# Patient Record
Sex: Male | Born: 1940 | Race: White | Hispanic: No | Marital: Single | Smoking: Former smoker
Health system: Southern US, Community
[De-identification: ages and names within clinical notes are randomized; demographics above are authoritative.]

## PROBLEM LIST (undated history)

## (undated) DIAGNOSIS — I82409 Acute embolism and thrombosis of unspecified deep veins of unspecified lower extremity: Secondary | ICD-10-CM

## (undated) DIAGNOSIS — I739 Peripheral vascular disease, unspecified: Secondary | ICD-10-CM

## (undated) DIAGNOSIS — Z9289 Personal history of other medical treatment: Secondary | ICD-10-CM

## (undated) DIAGNOSIS — E785 Hyperlipidemia, unspecified: Secondary | ICD-10-CM

## (undated) DIAGNOSIS — I4891 Unspecified atrial fibrillation: Secondary | ICD-10-CM

## (undated) DIAGNOSIS — I714 Abdominal aortic aneurysm, without rupture, unspecified: Secondary | ICD-10-CM

## (undated) DIAGNOSIS — I2510 Atherosclerotic heart disease of native coronary artery without angina pectoris: Secondary | ICD-10-CM

## (undated) DIAGNOSIS — I350 Nonrheumatic aortic (valve) stenosis: Secondary | ICD-10-CM

## (undated) DIAGNOSIS — I4892 Unspecified atrial flutter: Secondary | ICD-10-CM

## (undated) DIAGNOSIS — I10 Essential (primary) hypertension: Secondary | ICD-10-CM

## (undated) DIAGNOSIS — I723 Aneurysm of iliac artery: Secondary | ICD-10-CM

## (undated) DIAGNOSIS — Z951 Presence of aortocoronary bypass graft: Secondary | ICD-10-CM

## (undated) HISTORY — DX: Abdominal aortic aneurysm, without rupture, unspecified: I71.40

## (undated) HISTORY — DX: Presence of aortocoronary bypass graft: Z95.1

## (undated) HISTORY — DX: Peripheral vascular disease, unspecified: I73.9

## (undated) HISTORY — DX: Abdominal aortic aneurysm, without rupture: I71.4

## (undated) HISTORY — PX: CORONARY ARTERY BYPASS GRAFT: SHX141

## (undated) HISTORY — DX: Unspecified atrial flutter: I48.92

## (undated) HISTORY — DX: Personal history of other medical treatment: Z92.89

## (undated) HISTORY — DX: Atherosclerotic heart disease of native coronary artery without angina pectoris: I25.10

## (undated) HISTORY — DX: Acute embolism and thrombosis of unspecified deep veins of unspecified lower extremity: I82.409

## (undated) HISTORY — PX: FEMORAL-TIBIAL BYPASS GRAFT: SHX938

## (undated) HISTORY — DX: Essential (primary) hypertension: I10

## (undated) HISTORY — DX: Nonrheumatic aortic (valve) stenosis: I35.0

## (undated) HISTORY — DX: Unspecified atrial fibrillation: I48.91

## (undated) HISTORY — DX: Hyperlipidemia, unspecified: E78.5

## (undated) HISTORY — DX: Aneurysm of iliac artery: I72.3

---

## 2001-07-08 HISTORY — PX: BACK SURGERY: SHX140

## 2002-08-13 HISTORY — PX: CHOLECYSTECTOMY: SHX55

## 2003-11-16 ENCOUNTER — Inpatient Hospital Stay (HOSPITAL_COMMUNITY): Admission: RE | Admit: 2003-11-16 | Discharge: 2003-11-24 | Payer: Self-pay | Admitting: Cardiothoracic Surgery

## 2003-12-15 ENCOUNTER — Encounter: Admission: RE | Admit: 2003-12-15 | Discharge: 2003-12-15 | Payer: Self-pay | Admitting: Cardiothoracic Surgery

## 2007-05-22 ENCOUNTER — Encounter: Payer: Self-pay | Admitting: Internal Medicine

## 2007-06-03 HISTORY — PX: HERNIA REPAIR: SHX51

## 2008-01-28 HISTORY — PX: VASECTOMY: SHX75

## 2008-08-09 ENCOUNTER — Encounter: Admission: RE | Admit: 2008-08-09 | Discharge: 2008-08-09 | Payer: Self-pay | Admitting: Cardiovascular Disease

## 2008-08-16 ENCOUNTER — Ambulatory Visit (HOSPITAL_COMMUNITY): Admission: RE | Admit: 2008-08-16 | Discharge: 2008-08-16 | Payer: Self-pay | Admitting: Cardiovascular Disease

## 2008-08-16 HISTORY — PX: CARDIOVERSION: SHX1299

## 2009-01-05 ENCOUNTER — Encounter: Payer: Self-pay | Admitting: Internal Medicine

## 2009-01-11 ENCOUNTER — Ambulatory Visit (HOSPITAL_COMMUNITY): Admission: RE | Admit: 2009-01-11 | Discharge: 2009-01-11 | Payer: Self-pay | Admitting: Cardiovascular Disease

## 2009-01-11 ENCOUNTER — Encounter: Payer: Self-pay | Admitting: Internal Medicine

## 2009-01-24 ENCOUNTER — Encounter: Payer: Self-pay | Admitting: Internal Medicine

## 2009-01-25 ENCOUNTER — Encounter: Payer: Self-pay | Admitting: Internal Medicine

## 2009-03-02 ENCOUNTER — Encounter: Payer: Self-pay | Admitting: Internal Medicine

## 2009-03-23 ENCOUNTER — Encounter: Payer: Self-pay | Admitting: Internal Medicine

## 2009-04-26 ENCOUNTER — Encounter: Payer: Self-pay | Admitting: Internal Medicine

## 2009-09-19 ENCOUNTER — Encounter: Payer: Self-pay | Admitting: Internal Medicine

## 2009-09-29 ENCOUNTER — Encounter: Payer: Self-pay | Admitting: Internal Medicine

## 2009-10-06 ENCOUNTER — Encounter: Payer: Self-pay | Admitting: Internal Medicine

## 2009-11-13 ENCOUNTER — Ambulatory Visit: Payer: Self-pay | Admitting: Internal Medicine

## 2010-01-04 ENCOUNTER — Telehealth: Payer: Self-pay | Admitting: Internal Medicine

## 2010-02-12 ENCOUNTER — Ambulatory Visit: Payer: Self-pay | Admitting: Internal Medicine

## 2010-03-02 ENCOUNTER — Encounter: Payer: Self-pay | Admitting: Internal Medicine

## 2010-03-16 ENCOUNTER — Ambulatory Visit: Payer: Self-pay | Admitting: Internal Medicine

## 2010-03-19 ENCOUNTER — Observation Stay (HOSPITAL_COMMUNITY): Admission: RE | Admit: 2010-03-19 | Discharge: 2010-03-20 | Payer: Self-pay | Admitting: Internal Medicine

## 2010-03-19 ENCOUNTER — Ambulatory Visit: Payer: Self-pay | Admitting: Cardiovascular Disease

## 2010-03-19 HISTORY — PX: CARDIAC ELECTROPHYSIOLOGY STUDY AND ABLATION: SHX1294

## 2010-03-20 ENCOUNTER — Telehealth: Payer: Self-pay | Admitting: Internal Medicine

## 2010-03-29 ENCOUNTER — Telehealth (INDEPENDENT_AMBULATORY_CARE_PROVIDER_SITE_OTHER): Payer: Self-pay | Admitting: *Deleted

## 2010-04-26 ENCOUNTER — Ambulatory Visit: Payer: Self-pay | Admitting: Internal Medicine

## 2010-05-03 ENCOUNTER — Encounter: Payer: Self-pay | Admitting: Internal Medicine

## 2010-08-05 LAB — CONVERTED CEMR LAB
BUN: 16 mg/dL (ref 6–23)
BUN: 17 mg/dL (ref 6–23)
Basophils Absolute: 0 10*3/uL (ref 0.0–0.1)
Basophils Absolute: 0 10*3/uL (ref 0.0–0.1)
Calcium: 10.7 mg/dL — ABNORMAL HIGH (ref 8.4–10.5)
Creatinine, Ser: 1.1 mg/dL (ref 0.4–1.5)
Eosinophils Absolute: 0.3 10*3/uL (ref 0.0–0.7)
GFR calc non Af Amer: 55.84 mL/min (ref >60)
GFR calc non Af Amer: 69.38 mL/min (ref >60)
Glucose, Bld: 112 mg/dL — ABNORMAL HIGH (ref 70–99)
HCT: 42.8 % (ref 39.0–52.0)
Hemoglobin: 14.7 g/dL (ref 13.0–17.0)
Hemoglobin: 17.4 g/dL — ABNORMAL HIGH (ref 13.0–17.0)
INR: 1.7 — ABNORMAL HIGH (ref 0.8–1.0)
Lymphocytes Relative: 22.3 % (ref 12.0–46.0)
Lymphs Abs: 1.7 10*3/uL (ref 0.7–4.0)
MCHC: 34.4 g/dL (ref 30.0–36.0)
MCV: 92.6 fL (ref 78.0–100.0)
MCV: 92.6 fL (ref 78.0–100.0)
Monocytes Absolute: 1.1 10*3/uL — ABNORMAL HIGH (ref 0.1–1.0)
Monocytes Absolute: 1.5 10*3/uL — ABNORMAL HIGH (ref 0.1–1.0)
Monocytes Relative: 12.3 % — ABNORMAL HIGH (ref 3.0–12.0)
Monocytes Relative: 13.3 % — ABNORMAL HIGH (ref 3.0–12.0)
Neutro Abs: 5.5 10*3/uL (ref 1.4–7.7)
Prothrombin Time: 24.1 s — ABNORMAL HIGH (ref 9.7–11.8)
RBC: 4.62 M/uL (ref 4.22–5.81)
RDW: 15 % — ABNORMAL HIGH (ref 11.5–14.6)
RDW: 15.3 % — ABNORMAL HIGH (ref 11.5–14.6)
Sodium: 142 meq/L (ref 135–145)
WBC: 12.1 10*3/uL — ABNORMAL HIGH (ref 4.5–10.5)
aPTT: 38.3 s — ABNORMAL HIGH (ref 21.7–28.8)
aPTT: 43.1 s — ABNORMAL HIGH (ref 21.7–28.8)

## 2010-08-09 NOTE — Progress Notes (Signed)
Summary: Pradaxa samples  Phone Note Other Incoming   Caller: Haematologist (hospital) Summary of Call: Per [facility], the pt is being d/c'ed from the hospital today. He will be on Pradaxa- request for samples to be placed at the front desk.  Pradaxa 130m  Lot-- [id number] Exp-10/12 #24  Placed at front desk. Initial call taken by: HAlvis Lemmings RN, BSN,  March 20, 2010 8:20 AM

## 2010-08-09 NOTE — Assessment & Plan Note (Signed)
Summary: [email or web address]   Visit Type:  Follow-up Referring Serah Nicoletti:  Dr Georgina Peer Primary Zaia Carre:  Dr Stevie Kern   History of Present Illness: The patient presents today for routine electrophysiology followup. He reports doing very well since his atrial flutter ablation.  He denies procedure related complications.  He is unaware of any further arrhythmias.  The patient denies symptoms of palpitations, chest pain, shortness of breath, orthopnea, PND, lower extremity edema, dizziness, presyncope, syncope, or neurologic sequela. The patient is tolerating medications without difficulties and is otherwise without complaint today.   Current Medications (verified): 1)  Diovan Hct 80-12.5 Mg Tabs (Valsartan-Hydrochlorothiazide) .... Take One Tablet By Mouth Once Daily. 2)  Metoprolol Succinate 50 Mg Xr24h-Tab (Metoprolol Succinate) .... Take 1/4 Tablet By Mouth Daily 3)  Niaspan 500 Mg Cr-Tabs (Niacin (Antihyperlipidemic)) .... Qd 4)  Aspirin 81 Mg Tbec (Aspirin) .... Take One Tablet By Mouth Daily 5)  Fish Oil   Oil (Fish Oil) .... Once Daily 6)  Simvastatin 40 Mg Tabs (Simvastatin) .... Take One Tablet By Mouth Daily At Bedtime 7)  Folic Acid 1 Mg Tabs (Folic Acid) .... Once Daily 8)  Vitamin D 2000 Unit Caps (Cholecalciferol) .... Once Daily 9)  Calcium Carbonate   Powd (Calcium Carbonate) .... 6100m Two Times A Day 10)  Pradaxa 150 Mg Caps (Dabigatran Etexilate Mesylate) .... Two Times A Day  Allergies: 1)  ! * Duragesic Patch  Past History:  Past Medical History: ATRIAL FLUTTER (ICD-427.32) Moderate LA enlargement CAD s/p CABG and repeat CABG Ischemic CM (EF 40%) NYHA Class II CHF PEPTIC ULCER DISEASE (ICD-533.90) HYPERLIPIDEMIA (ICD-272.4) PVD (ICD-443.9) 3x3cm Aortic aneurysm denies DM, CVA or HTN  Past Surgical History: Reviewed history from 11/13/2009 and no changes required. S/p CABG 1994, redo CABG 2005 Status post cholecystectomy. Status post lumbar laminectomy. S/p  hernia operation s/p R fem-pop bypass  Social History: Reviewed history from 11/13/2009 and no changes required. [place]  Retired from [employer]  Tob- quit 1988.  ETOH- quit 198 denies drugs  Review of Systems       All systems are reviewed and negative except as listed in the HPI.   Vital Signs:  Patient profile:   70year old male Height:      73 inches Weight:      224 pounds BMI:     29.66 Pulse rate:   55 / minute BP sitting:   92 / 60  (left arm)  Vitals Entered By: JMargaretmary BayleyCMA (April 26, 2010 11:05 AM)  Physical Exam  General:  Well developed, well nourished, in no acute distress. Head:  normocephalic and atraumatic Eyes:  PERRLA/EOM intact; conjunctiva and lids normal. Mouth:  Teeth, gums and palate normal. Oral mucosa normal. Neck:  supple, no bruits Lungs:  Clear bilaterally to auscultation and percussion. Heart:  RRR, no m/r/g Abdomen:  Bowel sounds positive; abdomen soft and non-tender without masses, organomegaly, or hernias noted. No hepatosplenomegaly. Msk:  Back normal, normal gait. Muscle strength and tone normal. Pulses:  pulses normal in all 4 extremities Extremities:  No clubbing or cyanosis. Neurologic:  Alert and oriented x 3.   EKG  Procedure date:  04/26/2010  Findings:      sinus bradycardia 55 bpm, PR 180, incomplete RBBB  Impression & Recommendations:  Problem # 1:  ATRIAL FLUTTER (ICD-427.32) The patient is doing well s/p atrial flutter ablation.  He denies procedure related complications and is maintaining sinus rhythm. His carries a h/o afib  by medical record, though upon my review of his prior EKGs, I only see atrial flutter.  I have spoken with Dr Claiborne Billings this AM who also only recalls atrial flutter. The patient will continue pradaxa but will follow-up with Dr Claiborne Billings early next week.  Dr Claiborne Billings will review his records.  If upon that review the patient has had only atrial flutter, then I would  recommend that we stop pradaxa.  However, if he has previously had or in the future develops afib, then he will require pradaxa longterm.  Problem # 2:  HYPERLIPIDEMIA (ICD-272.4) stable His updated medication list for this problem includes:    Niaspan 500 Mg Cr-tabs (Niacin (antihyperlipidemic)) ..... Qd    Simvastatin 40 Mg Tabs (Simvastatin) .Marland Kitchen... Take one tablet by mouth daily at bedtime  Problem # 3:  CAD (ICD-414.00) no symptoms of ischemia or CHF  his EF has been 40% previously.  IF he develops decline in EF in the future, he may be a candidate for ICD implantation at that time.   I have returned the entirety of the patient's care back to Dr Claiborne Billings.  He will contact me if I can be of further assistance.  Patient Instructions: 1)  follow-up with Dr Claiborne Billings 2)  return as needed

## 2010-08-09 NOTE — Progress Notes (Signed)
Summary: CALLING TO SCHEDULE PROCEDURE  Phone Note Call from Patient Call back at Home Phone [phone]   Caller: Patient Summary of Call: PT [place] Initial call taken by: Delsa Sale,  January 04, 2010 11:03 AM  Follow-up for Phone Call        set to see Dr Rayann Heman on 02/12/10 at 8:45.  pt aware Janan Halter, RN, BSN  January 04, 2010 4:28 PM

## 2010-08-09 NOTE — Assessment & Plan Note (Signed)
Summary: Troy Miller   Visit Type:  Follow-up Referring Provider:  Dr Georgina Peer Primary Provider:  Dr Stevie Kern   History of Present Illness: Mr Troy Miller is a pleasant 70 yo WM with a h/o atrial fibrillation and atrial flutter who presents today for EP follow-up.  He has decided to consider catheter ablation for atrial flutter.   He reports initially developing atrial flutter in early 2010.  He was found to have atrial flutter and underwent [facility] 2/10.  He did well for several months before developing recurrent atrial flutter.  He underwent repeat cardioversion for atrial flutter 01/2009 after being started on propafenone.  He reports improved exercise tolerance in sinus rhythm.  He reports symptoms of fatigue and decreased exercise tolerance during his arrhythmia.  He also reports palpitations and "irregular" heart beat.  He has returned to atrial flutter.  He reports dypsnea with moderate activity, worse with atrial flutter. The patient denies symptoms of chest pain, orthopnea, PND, lower extremity edema, dizziness, presyncope, syncope, or neurologic sequela. The patient is tolerating medications without difficulties and is otherwise without complaint today.   Current Medications (verified): 1)  Diovan Hct 80-12.5 Mg Tabs (Valsartan-Hydrochlorothiazide) .... Take One Tablet By Mouth Once Daily. 2)  Metoprolol Succinate 50 Mg Xr24h-Tab (Metoprolol Succinate) .... Take One-Half Tablet By Mouth Daily 3)  Niaspan 500 Mg Cr-Tabs (Niacin (Antihyperlipidemic)) .... Qd 4)  Aspirin 81 Mg Tbec (Aspirin) .... Take One Tablet By Mouth Daily 5)  Fish Oil   Oil (Fish Oil) .... Once Daily 6)  Simvastatin 40 Mg Tabs (Simvastatin) .... Take One Tablet By Mouth Daily At Bedtime 7)  Folic Acid 1 Mg Tabs (Folic Acid) .... Once Daily 8)  Vitamin D 2000 Unit Caps (Cholecalciferol) .... Once Daily 9)  Calcium Carbonate   Powd (Calcium Carbonate) .... 681m Two Times A Day 10)  Warfarin Sodium 5 Mg Tabs (Warfarin Sodium)  .... Use As Directed 11)  Propafenone Hcl 150 Mg Tabs (Propafenone Hcl) .... Q 8 Hours  Allergies: 1)  ! * Duragesic Patch  Past History:  Past Medical History: Reviewed history from 11/13/2009 and no changes required. ATRIAL FLUTTER (ICD-427.32) ATRIAL FIBRILLATION (ICD-427.31) Moderate LA enlargement CAD s/p CABG and repeat CABG Ischemic CM (EF 40%) NYHA Class II CHF PEPTIC ULCER DISEASE (ICD-533.90) HYPERLIPIDEMIA (ICD-272.4) PVD (ICD-443.9) 3x3cm Aortic aneurysm denies DM, CVA or HTN  Past Surgical History: Reviewed history from 11/13/2009 and no changes required. S/p CABG 1994, redo CABG 2005 Status post cholecystectomy. Status post lumbar laminectomy. S/p hernia operation s/p R fem-pop bypass  Family History: Reviewed history from 11/13/2009 and no changes required. CAD  Social History: Reviewed history from 11/13/2009 and no changes required. [place]  Retired from [employer]  Tob- quit 1988.  ETOH- quit 198 denies drugs  Review of Systems       All systems are reviewed and negative except as listed in the HPI.   Vital Signs:  Patient profile:   70year old male Height:      73 inches Weight:      225 pounds Pulse rate:   76 / minute BP sitting:   122 / 78  (left arm)  Vitals Entered By: JMargaretmary BayleyCMA (February 12, 2010 9:03 AM)  Physical Exam  General:  Well developed, well nourished, in no acute distress. Head:  normocephalic and atraumatic Eyes:  PERRLA/EOM intact; conjunctiva and lids normal. Mouth:  Teeth, gums and palate normal. Oral mucosa normal. Neck:  supple,  no bruits Lungs:  Clear bilaterally to auscultation and percussion. Heart:  iRRR, no m/r/g Abdomen:  Bowel sounds positive; abdomen soft and non-tender without masses, organomegaly, or hernias noted. No hepatosplenomegaly. Msk:  Back normal, normal gait. Muscle strength and tone normal. Pulses:  pulses normal in all 4 extremities Extremities:   No clubbing or cyanosis.  1+ RLE edema (chronic) Neurologic:  Alert and oriented x 3. Skin:  Intact without lesions or rashes. Psych:  Normal affect.   EKG  Procedure date:  02/12/2010  Findings:      typical appearing atrial flutter,  V rate 75 bpm,incomplete RBBB  Impression & Recommendations:  Problem # 1:  ATRIAL FLUTTER (ICD-427.32)  Mr Troy Miller is a pleasant 70 yo WM with h/o CAD and atrial flutter who presents today for EP follow-up.  Though he carries a diagnosis of afib, I have only atrial flutter documented.  His atrial flutter appears typical by EKG today. Therapeutic strategies for afib and atrial flutter including medicine and ablation were discussed in detail with the patient today. Given that his primary arrhythmia is atrial flutter, I think that we should proceed with atrial flutter ablation and then monitor for atrial fibrillation thereafter.  Risk, benefits, and alternatives to EP study and radiofrequency ablation for atrial flutter were also discussed in detail today. These risks include but are not limited to stroke, bleeding, vascular damage, tamponade, perforation, damage to the heart and other structures, pacemaker, and death. The patient understands these risk and wishes to proceed.    I have also discussed that I would not typically treat him with a Ic drug given risks for death in the [place] trial. I would stop propafenone after the ablation procedure. If he has further atrial fibrillation, then we should consider tikosyn or sotalol for rhythm control.  Orders: TLB-BMP (Basic Metabolic Panel-BMET) (58850-YDXAJOI) TLB-CBC Platelet - w/Differential (85025-CBCD) TLB-PT (Protime) (85610-PTP) TLB-PTT (85730-PTTL)  Problem # 2:  ATRIAL FIBRILLATION (ICD-427.31) as above  Patient Instructions: 1)  Your physician has recommended that you have an ablation.  Catheter ablation is a medical procedure used to treat some cardiac arrhythmias (irregular heartbeats). During  catheter ablation, a long, thin, flexible tube is put into a blood vessel in your groin (upper thigh), or neck. This tube is called an ablation catheter. It is then guided to your heart through the blood vessel. Radiofrequency waves destroy small areas of heart tissue where abnormal heartbeats may cause an arrhythmia to start.  Please see the instruction sheet given to you today.

## 2010-08-09 NOTE — Letter (Signed)
Summary: Office Note   Office Note   Imported By: Sallee Provencal 11/27/2009 10:52:20  _____________________________________________________________________  External Attachment:    Type:   Image     Comment:   External Document

## 2010-08-09 NOTE — Progress Notes (Signed)
Summary: Patients Medical History/Med List  Patients Medical History/Med List   Imported By: Sallee Provencal 11/27/2009 11:03:55  _____________________________________________________________________  External Attachment:    Type:   Image     Comment:   External Document

## 2010-08-09 NOTE — Letter (Signed)
Summary: [facility] By: Sallee Provencal 11/10/2009 13:01:41  _____________________________________________________________________  External Attachment:    Type:   Image     Comment:   External Document

## 2010-08-09 NOTE — Letter (Signed)
Summary: [facility] Office Note  [facility] Office Note   Imported By: Sallee Provencal 11/27/2009 10:46:29  _____________________________________________________________________  External Attachment:    Type:   Image     Comment:   External Document

## 2010-08-09 NOTE — Letter (Signed)
Summary: ELectrophysiology/Ablation Procedure Instructions  [facility]  [address] [place]   [place], [place] [address]   Phone: [phone]  Fax: [phone]     Electrophysiology/Ablation Procedure Instructions    You are scheduled for a(n) flutter ablation on 02/20/10 at 7:30am with [name].  1.  Please come to the [place] at [facility] at 5:30am on the day of your procedure.  2.  Come prepared to stay overnight.   Please bring your insurance cards and a list of your medications.  3.  Come to the [place] office on 02/12/10 for lab work.  .  You do not have to be fasting.  4.  Do not have anything to eat or drink after midnight the night before your procedure.  5.  Do NOT take these medications for the morning of your procedure unless otherwise instructed:  Metoprlol.  All of your remaining medications may be taken with a small amount of water.  6.  Educational material received:    _____ Ablation   * Occasionally, EP studies and ablations can become lengthy.  Please make your family aware of this before your procedure starts.  Average time ranges from 2-8 hours for EP studies/ablations.  Your physician will locate your family after the procedure with the results.  * If you have any questions after you get home, please call the office at [phone].   Leonia Reader  Appended Document: ELectrophysiology/Ablation Procedure Instructions This was reschduled due to INR's new date is 03/19/10 7:30am Check in at Short Stay at 5:30am Nothing to eat or drink after midnight Labs 03/13/10

## 2010-08-09 NOTE — Progress Notes (Signed)
Summary: Question about Pradaxa  Phone Note Call from Patient Call back at Home Phone [phone] Call back at [phone]   Caller: Patient Summary of Call: Pt have question about Pradaxa Initial call taken by: Delsa Sale,  March 29, 2010 3:47 PM  Follow-up for Phone Call        spoke with pt and he needs prior author for Pradaxa 174m daily [place] [phone] Please call and get author done. Pt has 2 weeks of pills left.  We can mail him some samples also.   KJanan Halter RN, BSN  March 29, 2010 5:26 PM  Additional Follow-up for Phone Call Additional follow up Details #1::        Called [facility] in [place]. Pharmacy does not have record of pt having script for pradaxa there. I called pt to confirm. Pt had not taken script to pharmacy yet. The patient said that he would take his script to the pharmacy today to be run through.  Awaiting call from Pt or pharmacy with prior auth info.  Additional Follow-up by: JMargaretmary BayleyCMA,  March 30, 2010 10:08 AM    Additional Follow-up for Phone Call Additional follow up Details #2::    pt calling to say pradaxa needs prior authorization -[facility] [place] April 09, 2010 11:50 AM

## 2010-08-09 NOTE — Letter (Signed)
Summary: [facility] Office Note  [facility] Office Note   Imported By: Sallee Provencal 11/27/2009 10:47:13  _____________________________________________________________________  External Attachment:    Type:   Image     Comment:   External Document

## 2010-08-09 NOTE — Letter (Signed)
Summary: [facility]   Imported By: Marilynne Drivers 06/28/2010 15:21:51  _____________________________________________________________________  External Attachment:    Type:   Image     Comment:   External Document

## 2010-08-09 NOTE — Letter (Signed)
Summary: [facility] By: Sallee Provencal 11/10/2009 13:02:03  _____________________________________________________________________  External Attachment:    Type:   Image     Comment:   External Document

## 2010-08-09 NOTE — Letter (Signed)
Summary: [place] [facility] By: Sallee Provencal 11/10/2009 13:01:19  _____________________________________________________________________  External Attachment:    Type:   Image     Comment:   External Document

## 2010-08-09 NOTE — Assessment & Plan Note (Signed)
Summary: nep/eval for ablation   Visit Type:  Initial Consult Referring Provider:  Dr Georgina Peer Primary Provider:  Dr Stevie Kern  CC:  atrial flutter.  History of Present Illness: Mr Mey is a pleasant 70 yo WM with a h/o atrial fibrillation and atrial flutter who presents today for EP consultation.  He reports initially developing atrial flutter in early 2010.  He was found to have atrial flutter and underwent [facility] 2/10.  He did well for several months before developing recurrent atrial flutter.  He underwent repeat cardioversion for atrial flutter 01/2009 after being started on propafenone.  He reports improved exercise tolerance in sinus rhythm.  He reports symptoms of fatigue and decreased exercise tolerance during his arrhythmia.  He also reports palpitations and "irregular" heart beat.  He has returned to atrial flutter.  He reports dypsnea with moderate activity, worse with atrial flutter. The patient denies symptoms of chest pain, orthopnea, PND, lower extremity edema, dizziness, presyncope, syncope, or neurologic sequela. The patient is tolerating medications without difficulties and is otherwise without complaint today.   Current Medications (verified): 1)  Diovan Hct 80-12.5 Mg Tabs (Valsartan-Hydrochlorothiazide) .... Take One Tablet By Mouth Once Daily. 2)  Metoprolol Succinate 50 Mg Xr24h-Tab (Metoprolol Succinate) .... Take One-Half Tablet By Mouth Daily 3)  Niaspan 500 Mg Cr-Tabs (Niacin (Antihyperlipidemic)) 4)  Aspirin 81 Mg Tbec (Aspirin) .... Take One Tablet By Mouth Daily 5)  Fish Oil   Oil (Fish Oil) .... Once Daily 6)  Simvastatin 40 Mg Tabs (Simvastatin) .... Take One Tablet By Mouth Daily At Bedtime 7)  Folic Acid 1 Mg Tabs (Folic Acid) .... Once Daily 8)  Vitamin D 2000 Unit Caps (Cholecalciferol) .... Once Daily 9)  Calcium Carbonate   Powd (Calcium Carbonate) .... 657m Two Times A Day 10)  Warfarin Sodium 5 Mg Tabs (Warfarin Sodium) .... Use As Directed 11)   Propafenone Hcl 150 Mg Tabs (Propafenone Hcl) .... Q 8 Hours  Allergies (verified): 1)  ! * Duragesic Patch  Past History:  Past Medical History: ATRIAL FLUTTER (ICD-427.32) ATRIAL FIBRILLATION (ICD-427.31) Moderate LA enlargement CAD s/p CABG and repeat CABG Ischemic CM (EF 40%) NYHA Class II CHF PEPTIC ULCER DISEASE (ICD-533.90) HYPERLIPIDEMIA (ICD-272.4) PVD (ICD-443.9) 3x3cm Aortic aneurysm denies DM, CVA or HTN  Past Surgical History: S/p CABG 1994, redo CABG 2005 Status post cholecystectomy. Status post lumbar laminectomy. S/p hernia operation s/p R fem-pop bypass  Family History: CAD  [place].  Retired from [employer]  [employer]ob- quit 1988.  ETOH- quit 198 denies drugs  Review of Systems       All systems are reviewed and negative except as listed in the HPI.   Vital Signs:  Patient profile:   70year old male Height:      73 inches Weight:      223 pounds BMI:     29.53 Pulse rate:   72 / minute Pulse rhythm:   irregular BP sitting:   120 / 84  (left arm)  Vitals Entered By: JMargaretmary BayleyCMA (Nov 13, 2009 10:22 AM)  Physical Exam  General:  Well developed, well nourished, in no acute distress. Head:  normocephalic and atraumatic Eyes:  PERRLA/EOM intact; conjunctiva and lids normal. Nose:  no deformity, discharge, inflammation, or lesions Mouth:  Teeth, gums and palate normal. Oral mucosa normal. Neck:  supple, no bruits Lungs:  Clear bilaterally to auscultation and percussion. Heart:  RRR, no m/r/g Abdomen:  Bowel sounds  positive; abdomen soft and non-tender without masses, organomegaly, or hernias noted. No hepatosplenomegaly. Msk:  Back normal, normal gait. Muscle strength and tone normal. Pulses:  pulses normal in all 4 extremities Extremities:  No clubbing or cyanosis.  1+ RLE edema (chronic) Neurologic:  Alert and oriented x 3. Skin:  Intact without lesions or rashes. Cervical Nodes:  no  significant adenopathy Psych:  Normal affect.   EKG  Procedure date:  11/13/2009  Findings:      atrial flutter (possibly clockwise) V rate 72, nonspecific St/T changes  EKG  Procedure date:  09/29/2009  Findings:      typical appearing atrial flutter, V rate 50  Echocardiogram  Procedure date:  10/06/2009  Findings:      LVEF 40%,  moderate LVH,  mild RV dilatation,  mild MR, LA moderately enlarged  Impression & Recommendations:  Problem # 1:  ATRIAL FLUTTER (ICD-427.32) Mr Ebeling is a pleasant 70 yo WM with h/o CAD and atrial flutter who presents today for EP consultation.  Though he carries a diagnosis of afib, I have only atrial flutter documented.  His atrial flutter has previously appeared typical by ekg, though his EKG today is atypical, possibly clockwise flutter. Therapeutic strategies for afib and atrial flutter including medicine and ablation were discussed in detail with the patient today. Given that his primary arrhythmia is atrial flutter, I think that we should proceed with atrial flutter ablation and then monitor for atrial fibrillation thereafter.  Risk, benefits, and alternatives to EP study and radiofrequency ablation for atrial flutter were also discussed in detail today. These risks include but are not limited to stroke, bleeding, vascular damage, tamponade, perforation, damage to the heart and other structures, pacemaker, and death. The patient understands these risk and wishes to further contemplate his options. He should continue coumadin. I have also discussed that I would not typically treat him with a Ic drug given risks for death in the [place] trial. If he goes for carto guided ablation, then I would stop propafenone after the procedure.  If he does not, then we should consider either tikosyn or multaq for rhythm control. I will defer this decision to Dr Claiborne Billings.   Problem # 2:  CAD (ICD-414.00) stable no changes today His updated medication list for  this problem includes:    Metoprolol Succinate 50 Mg Xr24h-tab (Metoprolol succinate) .Marland Kitchen... Take one-half tablet by mouth daily    Aspirin 81 Mg Tbec (Aspirin) .Marland Kitchen... Take one tablet by mouth daily    Warfarin Sodium 5 Mg Tabs (Warfarin sodium) ..... Use as directed  Problem # 3:  PVD (ICD-443.9) stable no changes today  Patient Instructions: 1)  Pt will contact our office if he wishes to proceed with ablation.  He will follow-up with Dr Claiborne Billings.

## 2010-08-09 NOTE — Progress Notes (Signed)
Summary: Med List  Med List   Imported By: Sallee Provencal 11/10/2009 12:46:58  _____________________________________________________________________  External Attachment:    Type:   Image     Comment:   External Document

## 2010-08-09 NOTE — Letter (Signed)
Summary: [place] By: Sallee Provencal 11/10/2009 12:44:35  _____________________________________________________________________  External Attachment:    Type:   Image     Comment:   External Document

## 2010-08-09 NOTE — Letter (Signed)
Summary: [facility] Office Note  [facility] Office Note   Imported By: Sallee Provencal 11/27/2009 10:43:54  _____________________________________________________________________  External Attachment:    Type:   Image     Comment:   External Document

## 2010-09-20 LAB — CBC
HCT: 48 % (ref 39.0–52.0)
Hemoglobin: 16 g/dL (ref 13.0–17.0)
RDW: 14.7 % (ref 11.5–15.5)
WBC: 8.7 10*3/uL (ref 4.0–10.5)

## 2010-09-20 LAB — PROTIME-INR: INR: 1.77 — ABNORMAL HIGH (ref 0.00–1.49)

## 2010-09-20 LAB — APTT: aPTT: 42 seconds — ABNORMAL HIGH (ref 24–37)

## 2010-09-20 LAB — BASIC METABOLIC PANEL
Calcium: 9.8 mg/dL (ref 8.4–10.5)
Creatinine, Ser: 1.14 mg/dL (ref 0.4–1.5)

## 2010-10-14 LAB — PROTIME-INR
INR: 2.6 — ABNORMAL HIGH (ref 0.00–1.49)
Prothrombin Time: 30 seconds — ABNORMAL HIGH (ref 11.6–15.2)

## 2010-10-23 LAB — APTT

## 2010-10-23 LAB — PROTIME-INR
INR: 2.3 — ABNORMAL HIGH (ref 0.00–1.49)
Prothrombin Time: 26.6 seconds — ABNORMAL HIGH (ref 11.6–15.2)

## 2010-11-20 NOTE — Op Note (Signed)
NAME:  ALEXANDRE, FARIES NO.:  [id number]   MEDICAL RECORD NO.:  [id number]          PATIENT TYPE:  OIB   LOCATION:  2899                         FACILITY:  [facility]   PHYSICIAN:  Shelva Majestic, M.D.     DATE OF BIRTH:  Oct 16, 1940   DATE OF PROCEDURE:  08/16/2008  DATE OF DISCHARGE:  08/16/2008                               OPERATIVE REPORT   PATIENT PROFILE:  [name] is a 70 year old gentleman with  known coronary as well peripheral vascular disease.  He is status post  initial CABG surgery in May 1994 with redo surgery in May 2005.  In  December 2009, he was found to be in atrial flutter of questionable  duration.  Coumadin was instituted.  He has been documented to have  therapeutic anticoagulation for over a month.  INR today was 2.9.  On  August 09, 2008, was 2.7.  INR today, August 16, 2008, is 2.3.  Baseline ECG confirms atrial flutter with variable 3-4 block.   Sodium Pentothal 200 mg was administered by Dr. Fulton Reek for  the anesthesia.   Biphasic synchronized DC cardioversion was performed and the patient  received 100 joules of shock with anterior-posterior patches resulting  in restoration of normal sinus rhythm with a rare PAC.  He tolerated the  procedure well.   He will continue his home medications which include Toprol-XL 75 mg  daily, Diovan/hydrochlorothiazide 80/12.5 in addition to his aspirin,  simvastatin, Niaspan, fish oil, folic acid, and Coumadin 5 mg daily.   Subsequent ECG following cardioversion confirmed sinus rhythm with first-  degree AV block with a PR interval of 232 milliseconds.           ______________________________  Shelva Majestic, M.D.     TK/MEDQ  D:  08/16/2008  T:  08/17/2008  Job:  [id number]   cc:   Fritz Pickerel Dr. Vira Agar

## 2010-11-20 NOTE — Cardiovascular Report (Signed)
NAME:  Troy Miller, Troy Miller NO.:  [id number]   MEDICAL RECORD NO.:  [id number]          PATIENT TYPE:  OIB   LOCATION:  2899                         FACILITY:  [facility]   PHYSICIAN:  Quincy Carnes, MD      DATE OF BIRTH:  Nov 22, 1940   DATE OF PROCEDURE:  DATE OF DISCHARGE:  01/11/2009                            CARDIAC CATHETERIZATION   INDICATIONS:  [name] is a 70 year old gentleman with history of  coronary artery disease and peripheral vascular disease status post CABG  in May 1994 and redo CABG in May 2005 who is followed by Dr. Ellouise Newer  for his multiple cardiac issues.  [name] has a history of atrial  flutter and after discussing treatment options, scheduled for  cardioversion.   PROCEDURE:  After informed consent was obtained, defibrillator pads were  placed in the AP in the anterior-posterior position.  Dr. Ola Spurr  from Anesthesia Service managed the patient's issues related to  sedation.  The patient received propofol and subsequently received a 120-  joule biphasic defibrillator shock.   OUTCOME:  Successful restoration of sinus rhythm.   PLAN:  [name] was successfully cardioverted.  His postprocedure  EKG showed restoration of sinus rhythm with a first-degree AV block and  PACs.  She will follow up with Dr. Claiborne Billings for further medical therapy.   The procedure was completed without complications.      Quincy Carnes, MD  Electronically Signed     JE/MEDQ  D:  01/11/2009  T:  01/12/2009  Job:  [id number]

## 2010-11-23 NOTE — Discharge Summary (Signed)
[name], [name]:  [id number]   MEDICAL RECORD NO.:  [id number]                   PATIENT TYPE:  INP   LOCATION:  2024                                 FACILITY:  [facility]   PHYSICIAN:  Lilia Argue. Servando Snare, M.D.            DATE OF BIRTH:  01-28-1941   DATE OF ADMISSION:  11/16/2003  DATE OF DISCHARGE:  11/23/2003                                 DISCHARGE SUMMARY   ADMISSION DIAGNOSIS:  Recurrent severe coronary artery disease status post  coronary artery bypass grafting in 1994.   ADDITIONAL DIAGNOSES/DISCHARGE DIAGNOSES:  1. Known coronary artery disease status post coronary artery bypass grafting     in 1994.  2. Recurrent coronary artery disease status post reoperative coronary artery     bypass grafting completed on Nov 16, 2003.  3. Peripheral vascular disease status post right femoral to popliteal bypass     with greater saphenous vein in 1996.  4. Hyperlipidemia.  5. Remote history of smoking having quit in 1988.  6. Status post cholecystectomy.  7. Status post lumbar laminectomy.  8. History of peptic ulcer disease in the 1980s.  9. History of postoperative atrial fibrillation from his prior coronary     artery bypass grafting on long term Diltiazem therapy.  10.      Recurrent paroxysmal atrial fibrillation status post most recent     bypass grafting, discharged on Amiodarone and Diltiazem as well as     Coumadin therapy.  11.      Postoperative cellulitis at peripheral IV site on both the right     and left upper extremity, improving at the time of discharge.  12.      Hypertension.   HOSPITAL PROCEDURES/MANAGEMENT:  1. Coronary artery bypass grafting x3 (reoperative and off pulmonary bypass     procedure) completed on Nov 16, 2003 by [name] of [facility].  2. Anticoagulation therapy for paroxysmal atrial fibrillation.  3. IV antibiotic therapy for arm cellulitis secondary to peripheral IV as     well as suspected postoperative  pneumonia.   CONSULTATIONS:  1. Cardiac rehab.  2. Case management.   HISTORY OF PRESENT ILLNESS:  Troy Miller is a pleasant 70 year old male  with known coronary artery disease and peripheral vascular disease who  originally presented to [facility] at age 60 in May 1994 with claudication in his  right leg and angina. At that time, the patient underwent coronary artery  bypass graft x6 with a left internal mammary artery to the first obtuse  marginal, vein graft to the LAD, diagonal vein graft to the second obtuse  marginal, vein graft to the posterior descending and posterior lateral  coronary arteries by Dr. Redmond Pulling. The patient has done quite well from a  cardiac standpoint until recently when he noted increasing shortness of  breath with exertion and was evaluated by Dr. Ellouise Newer. At that time a  Cardiolite stress test was performed  and showed anterior lateral ischemia  and inferior apical scar with mild peri-infarct ischemia at the apex. This  ischemia was new since the patient's prior study. Now this lead to a cardiac  catheterization on October 25, 2003.   The cardiac catheterization films showed the patient's vein graft to the PD  to PL had a proximal 80% stenosis, which would be amenable to angioplasty  and/or stenting. The sequential vein graft to the diagonal LAD had a complex  lesion at the origin of the diagonal graft. The distal LAD was occluded and  filled with collaterals. The left internal mammary artery was patent in  supplying the first obtuse marginal. The vein graft to the second obtuse  marginal was occluded. The distal circumflex and second obtuse marginal were  very small vessels and probably not bypassable.   [name] of [facility] saw the patient in consultation in the [facility] on  Nov 10, 2003. He reviewed the treatment options including angioplasty and  stenting on the right, attempted angioplasty of a vein graft to that  diagonal. They also discussed redo  coronary artery bypass grafting. The  risks benefits, and alternatives to the procedure were discussed with the  patient and his family at that time and they were agreeable to proceed with  surgery. The plan was to rebypass the diagonal and LAD as well as the  proximal portion of the right vein graft with vein from the left thigh and  possibly right internal mammary artery. Given the patient's history of  greater saphenous vein harvesting from the right and left lower leg for  previous bypass as well as right thigh for femoral to popliteal bypass, the  only option remained for left thigh saphenous vein harvesting. The patient  had a markedly positive bilaterally Allen's test, therefore the radial  arteries were not a first choice for conduit. The patient was agreeable to  surgery for a better long term solution and this was planned. Tentatively  arranged for Wednesday, Nov 16, 2003.   HOSPITAL COURSE:  Troy Miller was electively admitted to [facility] on Nov 16, 2003 and underwent a reoperative coronary artery bypass  grafting x3 without the use of cardiopulmonary bypass. The right internal  mammary artery was harvested and grafted to the LAD. The previous left  internal mammary artery graft was preserved to the OM1. The greater  saphenous vein was harvested and grafted to the diagonal and greater  saphenous vein was grafted to the old saphenous vein graft at the distal  portion of the PDA. The saphenous vein was harvested endoscopically from the  left thigh. The patient tolerated this procedure well and was transferred to  the surgical intensive care unit in stable condition. The patient was  successfully extubated later that evening and remained hemodynamically  stable without evidence of bleeding.   The patient was awake, alert, and  neurologically intact on postoperative  day number one. His chest tubes were discontinued in a stepwise fashion without difficulty. His  invasive lines were also discontinued. He was deemed  appropriate and stable for transfer to the step-down unit.   The patient's postoperative course was complicated by paroxysmal atrial  fibrillation. The patient did have a history of atrial fibrillation after  his prior bypass surgery. The patient was asymptomatic at the time of  conversion to a rate controlled atrial fibrillation at the 110-120s. The  patient was started in IV amiodarone therapy and converted into a normal  sinus rhythm  within 48 hours. He was converted to p.o. amiodarone therapy  and maintained a normal sinus rhythm until the time of initiation of  discharge planning. The patient was initiated on Coumadin therapy for a  history of paroxysmal atrial fibrillation and the high risk of conversion  into and out of normal sinus rhythm and atrial fibrillation. The patient was  discharged on appropriate oral medications including:  Diltiazem,  Amiodarone, Toprol to control his rhythm.   The patient had several IV sites infiltrate while maintaining IV amiodarone  therapy. These were treated appropriately with warm compresses and elevation  and the patient was started on appropriate antibiotics for cellulitis.   Postoperative, the patient had a productive cough of thick, greenish sputum  and had an elevated white count and persistent fever. He was initiated on  appropriate antibiotics for possible postoperative pneumonia. At the time of  initiation of discharge planning he had remained afebrile for greater than  48 hours and his white count was trending down. He no longer had a cough  productive of sputum. He was maintaining his saturations in the mid 90s on  room air. The sputum cultures taken came back positive for normal flora.   The patient was deemed appropriate for initiation of discharge planning on  Nov 21, 2003 or postoperative day number six. He was ambulating in the  hallways without difficulty. He had resumed  normal bowel and bladder  function. He had remained afebrile. His blood pressure was stable at 116/66.  His heart was in a regular rate and rhythm reading normal sinus rhythm on  telemetry and a rate of 80s. His SAO2 was 93-95% on room air. His weight was  226 pounds with his preoperative weight being 231 pounds. His lungs were  clear to auscultation. His abdomen was benign. He had no lower extremity  edema. He did have trace upper extremity edema and swelling secondary to  cellulitis. The left upper arm was worse than the right as far as redness,  swelling, and mild edema. The patient's sternum was stable, clean, dry, and  intact. His left lower extremity incision was healing well.   Lab values at the time of discharge planning were PT/INR 15.8 and 1.4 on Nov 22, 2003. BMET from May 16 reads sodium 135, potassium 4.1, chloride 103,  CO2 26, BUN 10, creatinine 1.1, glucose 109. CBC from May 16 reads WBC 11.4,  hemoglobin 11.8, hematocrit 34.8, and platelets of 214.  The patient will be discharged as planned on Nov 23, 2003 pending a.m.  rounds and no change in his clinical status.   DISCHARGE MEDICATIONS:  1. Aspirin 81 mg p.o. daily.  2. Toprol XL 25 mg p.o. daily.  3. Altace 2.5 mg p.o. b.i.d.  4. Lipitor 10 mg p.o. daily.  5. Prozac 20 mg p.o. daily.  6. Niacin 50 mg p.o. q.a.m. and 1000 mg p.o. q.p.m.  7. Isosorbide mononitrate 30 mg p.o. daily.  8. Diltiazem 180 mg p.o. daily.  9. Coumadin 5 mg p.o. daily, then as directed by Dr. Evette Georges office.  10.      Amiodarone 400 mg p.o. b.i.d. for five days, then 400 mg p.o.     daily.  11.      Omnicef 300 mg p.o. b.i.d. for five days.  12.      Ultram 50 mg 1-2 tabs every 4-6 hours p.r.n. for pain.   DISCHARGE INSTRUCTIONS:   ACTIVITY:  The patient should avoid driving. He should avoid heavy  lifting  or strenuous activity. He should continue to walk daily. He should continue  his breathing exercises for one more week.   DIET:   The patient should follow a low-fat, low-salt, heart healthy diet.   WOUND CARE:  The patient may shower. He should wash his incisions daily with  soap and water. He should notify the [facility] office if he has any increased  redness, swelling, or drainage from his incisions.   SPECIAL INSTRUCTIONS:  The patient is to obtain PT/INR blood work on Nov 25, 2003 by Dr. Evette Georges office.   DISCHARGE FOLLOW UP:  1. The patient is to see Dr. Claiborne Billings within two weeks of discharge. He is to     call Dr. Evette Georges office to make this appointment, date, and time. His     office number is [phone].  2. The patient is to see [name] of [facility] on December 15, 2003 at 1 p.m. He     is to arrive at [facility] on December 15, 2003 at 12 p.m.     and undergo a chest x-ray, which he will carry to Dr. Everrett Coombe office     for review.      Carolyn A. Zigmund Gottron.                  Lilia Argue Servando Snare, M.D.    CAF/MEDQ  D:  11/22/2003  T:  11/23/2003  Job:  [id number]   cc:   Shelva Majestic, M.D.  [phone] [address], [address] [place], [place] [address]  Fax: [phone]   Stevie Kern, P.A.-C.

## 2010-11-23 NOTE — Op Note (Signed)
Troy Miller, Troy Miller NO.:  [id number]   MEDICAL RECORD NO.:  [id number]                   PATIENT TYPE:  INP   LOCATION:  2024                                 FACILITY:  [facility]   PHYSICIAN:  Lilia Argue. Servando Snare, M.D.            DATE OF BIRTH:  23-Dec-1940   DATE OF PROCEDURE:  11/16/2003  DATE OF DISCHARGE:                                 OPERATIVE REPORT   PREOPERATIVE DIAGNOSIS:  Coronary occlusive disease, recurrent.   POSTOPERATIVE DIAGNOSIS:  Coronary occlusive disease, recurrent.   PROCEDURE:  Re-do coronary artery bypass grafting off-pump x 3, with the  right internal mammary to the left anterior descending coronary artery,  reverse saphenous vein graft to the diagonal coronary artery and reverse  saphenous vein graft to the distal right coronary graft with endo-vein  harvesting, left thigh.   SURGEON:  Lilia Argue. Servando Snare, M.D.   FIRST ASSISTANT:  Carolyn A. Zigmund Gottron.   BRIEF HISTORY:  The patient is a 70 year old male, who approximately 10  years previously, had undergone coronary artery bypass grafting by Dr.  Redmond Pulling with placement of left internal mammary artery to the obtuse marginal  coronary artery, reverse saphenous vein grafts to a second obtuse marginal,  sequential reverse saphenous vein graft to two small PD and PL branches of  the right coronary artery and sequential reverse saphenous vein graft to the  diagonal and LAD.  The patient initially did well but recently had begun  having increasing unstable anginal symptoms with a positive stress test  showing evidence of anterolateral ischemia.  Because of these symptoms, a  repeat cardiac catheterization was performed by Dr. Shelva Majestic.  The left  internal mammary artery was patent to the obtuse marginal coronary artery.  Vein graft to the second obtuse marginal was occluded.  The vein graft to  the diagonal LAD was diseased such that the anastomosis to the diagonal, the  distal limb of the vein graft went to a totally occluded, isolated segment  of the LAD.  This distal LAD filled by faint collaterals.  The previous  operative note by Dr. Redmond Pulling described the distal LAD as being a good  vessel.  In addition, there was a proximal 80% lesion in the vein graft to  the right coronary artery.  It was not felt that the diagonal LAD disease  was amenable to angioplasty and re-do coronary artery bypass grafting was  recommended.  The patient agreed and signed informed consent.   DESCRIPTION OF PROCEDURE:  With Swan-Ganz and arterial line monitors in  place, the patient underwent general endotracheal anesthesia without  incident.  Skin, chest and legs were prepped with Betadine and draped in the  usual sterile manner.   Previously vein had been totally harvested from the right leg and the left  lower leg.  There was adequate vein in the left thigh.  The patient also had  available right internal mammary.  Using the Guidant endo-vein harvesting  system, the vein was harvested from the left thigh and was of good quality  and caliber.   Median sternotomy was performed with the saggital saw, taking care not to  injure underlying mediastinal structures.  The right internal mammary artery  was dissected as pedicle graft.  The distal artery was divided and had good  free-flow.  The ascending aorta, the right atrium were dissected free of  adhesions.  The dissection was carried laterally identifying the vein graft  to the diagonal LAD.  The location of the left internal mammary artery was  located, however.  At this point, it was consideration to perform to bypass  surgery off-pump and so the left internal mammary artery was not disturbed.   Using the Guidant stabilization devices, adequate exposure of the LAD could  be obtained.  The patient was systemically heparinized with ACT monitored  throughout the case.  The heart was elevated.  With some difficulty, the   diagonal coronary artery was identified and with stabilization device in  place, the vessel was opened and admitted a 1-mm probe proximally and  distally.  Bleeding was controlled with small Serrefine proximally.   Using a segment of reverse saphenous vein graft, the distal anastomosis was  performed with a running 7-0 Prolene.  Retraction was then relaxed and the  vein graft trimmed to the appropriate length.  A partial-occlusion clamp was  placed on the ascending aorta.  Two new puncture aortotomies were performed  avoiding the three previous, old sites.  The two vein grafts were  anastomosed to the ascending aorta.   The partial-occlusion clamp was removed and flow was returned down the  diagonal coronary artery.  Because of both the op note description of the PD  and PL being small branches at the time of dissection, and also appearing to  be very small, it was decided to place the vein graft into the distal part  of the right graft that was not diseased, just prior to the takeoff of the  posterior descending anastomosis.  Using the vessel loop tapes, the old  right vein graft was isolated and opened.  The distal end of the previously  placed vein graft was then anastomosed to the old right coronary graft  distally.   Air was evacuated from the grafts, and the bulldog on the vein graft was  removed, restoring flow through both vein grafts.  Using the stabilization  device, again the heart was elevated.  The LAD was dissected out of its  epicardial fat, was opened, was a bypassable but small vessel, admitting a 1-  mm probe distally.  Using a running 8-0 Prolene, the right internal mammary  as a pedicle graft, was anastomosed to the left anterior descending coronary  artery.  Bulldog was removed from the mammary artery.  The anastomoses were  hemostatic.  The fascia was tacked to the epicardium.   The patient remained hemodynamically stable throughout the procedure.  A right pleural  tube, two mediastinal tubes left in place.  A portion of the  pericardium and mediastinal fat was covered over the ascending aorta.  The  sternum was then closed with #6 stainless steel wire.  Two atrial and two  ventricular pacing wires were applied.  The fascia was closed with  interrupted 0 Vicryl, running 3-0 Vicryl on the subcutaneous tissue and a 4-  0 subcuticular stitch on skin edges.  Dry dressings were applied.   Sponge and needle  count was reported as correct at the completion of the  procedure.  The patient tolerated the procedure without obvious  complications.   Estimated blood loss was less than 100 cc.   Because of the patient's very small size of his distal vessels, I would not  consider him as a candidate for re-do bypass surgery.                                               Lilia Argue Servando Snare, M.D.   Mcneil Sober  D:  11/17/2003  T:  11/17/2003  Job:  [id number]   cc:   Shelva Majestic, M.D.  [phone] [address], [address] [place], [place] [address]  Fax: [phone]

## 2012-06-26 ENCOUNTER — Other Ambulatory Visit (HOSPITAL_COMMUNITY): Payer: Self-pay | Admitting: Cardiovascular Disease

## 2012-06-26 DIAGNOSIS — I4891 Unspecified atrial fibrillation: Secondary | ICD-10-CM

## 2012-06-26 DIAGNOSIS — I2510 Atherosclerotic heart disease of native coronary artery without angina pectoris: Secondary | ICD-10-CM

## 2012-06-26 DIAGNOSIS — R0602 Shortness of breath: Secondary | ICD-10-CM

## 2012-07-10 ENCOUNTER — Ambulatory Visit (HOSPITAL_COMMUNITY)
Admission: RE | Admit: 2012-07-10 | Discharge: 2012-07-10 | Disposition: A | Discharge status: Home / Self Care | Payer: Medicare Other | Source: Ambulatory Visit | Attending: Cardiovascular Disease | Admitting: Cardiovascular Disease

## 2012-07-10 DIAGNOSIS — I10 Essential (primary) hypertension: Secondary | ICD-10-CM | POA: Insufficient documentation

## 2012-07-10 DIAGNOSIS — E785 Hyperlipidemia, unspecified: Secondary | ICD-10-CM | POA: Insufficient documentation

## 2012-07-10 DIAGNOSIS — I2510 Atherosclerotic heart disease of native coronary artery without angina pectoris: Secondary | ICD-10-CM

## 2012-07-10 DIAGNOSIS — R0602 Shortness of breath: Secondary | ICD-10-CM

## 2012-07-10 DIAGNOSIS — I359 Nonrheumatic aortic valve disorder, unspecified: Secondary | ICD-10-CM | POA: Insufficient documentation

## 2012-07-10 DIAGNOSIS — I4891 Unspecified atrial fibrillation: Secondary | ICD-10-CM | POA: Insufficient documentation

## 2012-07-10 MED ORDER — TECHNETIUM TC 99M SESTAMIBI GENERIC - CARDIOLITE
10.3000 | Freq: Once | INTRAVENOUS | Status: AC | PRN
  Administered 2012-07-10: 10 via INTRAVENOUS

## 2012-07-10 MED ORDER — TECHNETIUM TC 99M SESTAMIBI GENERIC - CARDIOLITE
30.1000 | Freq: Once | INTRAVENOUS | Status: AC | PRN
  Administered 2012-07-10: 30.1 via INTRAVENOUS

## 2012-07-10 MED ORDER — REGADENOSON 0.4 MG/5ML IV SOLN
0.4000 mg | Freq: Once | INTRAVENOUS | Status: AC
  Administered 2012-07-10: 0.4 mg via INTRAVENOUS

## 2012-07-10 NOTE — Progress Notes (Signed)
Troy Miller   2D echo completed 07/10/2012.   Jamison Neighbor, RDCS

## 2012-07-10 NOTE — Procedures (Addendum)
[name] [address] [name] [address] [phone]  Cardiology Nuclear Med Study  Troy Miller is a 73 y.o. male     MRN : [id number]     DOB: 05/23/1941  Procedure Date: 07/10/2012  Nuclear Med Background Indication for Stress Test:  Graft Patency History:  CABG 1994 redo 2005, PVD Cardiac Risk Factors: Family History - CAD, Hypertension, Lipids and Overweight  Symptoms:  Dizziness, Palpitations and SOB   Nuclear Pre-Procedure Caffeine/Decaff Intake:  7:00pm NPO After: 5:00am   IV Site: R Antecubital  IV 0.9% NS with Angio Cath:  22g  Chest Size (in):  46 IV Started by: Otho Perl, CNMT  Height: _0  (1.854 m)  Cup Size: n/a  BMI:  Body mass index is 29.55 kg/(m^2). Weight:  224 lb (101.606 kg)   Tech Comments:  n/a    Nuclear Med Study 1 or 2 day study: 1 day  Stress Test Type:  [place] Provider:  Shelva Majestic, MD   Resting Radionuclide: Technetium 75mSestamibi  Resting Radionuclide Dose: 10.3 mCi   Stress Radionuclide:  Technetium 929mestamibi  Stress Radionuclide Dose: 30.1 mCi           Stress Protocol Rest HR: 52 Stress HR: 78  Rest BP: 151/75 Stress BP: 147/73  Exercise Time (min): n/a METS: n/a   Predicted Max HR: 147 bpm % Max HR: 53.06 bpm Rate Pressure Product: 11778   Dose of Adenosine (mg):  n/a Dose of Lexiscan: 0.4 mg  Dose of Atropine (mg): n/a Dose of Dobutamine: n/a mcg/kg/min (at max HR)  Stress Test Technologist: TeLeane ParaCCT Nuclear Technologist: PaImagene RichesCNMT   Rest Procedure:  Myocardial perfusion imaging was performed at rest 45 minutes following the intravenous administration of Technetium 9916mstamibi. Stress Procedure: The patient received V Lexiscan 0.4 mg over 15-seconds.  Technetium 50m50mtamibi injected at 30- seconds. There were no significant changes with Lexiscan.  Quantitative spect images were obtained after a 45 minute  delay.  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  Stress ECG: No significant change from baseline ECG and There are scattered PVCs.  QPS Raw Data Images:  Normal; no motion artifact; normal heart/lung ratio. Stress Images:  Moderate in size, mild in intensity, inferior and apical anterior defect Rest Images:  Comparison with the stress images reveals no significant change inferiorly, but improved apical anterior perfusion. Subtraction (SDS):  Fixed nontransmural inferior defect with mild apical ischemia  Impression Exercise Capacity:  Lexiscan with no exercise. BP Response:  Normal blood pressure response. Clinical Symptoms:  No significant symptoms noted. ECG Impression:  No significant ST segment change suggestive of ischemia. Comparison with Prior Nuclear Study: No significant change from previous study  Overall Impression:  Low risk stress nuclear study demonstrating inferior nontransmural scar with mild apical anterior ischemia, not significantly changed from last study.  LV Wall Motion:  Very mild LV Function, EF 49%; minimal apical relative hypocontractility.   KELLTroy Sine  07/10/2012 12:49 PM

## 2012-08-31 ENCOUNTER — Encounter: Payer: Self-pay | Admitting: *Deleted

## 2012-08-31 DIAGNOSIS — K279 Peptic ulcer, site unspecified, unspecified as acute or chronic, without hemorrhage or perforation: Secondary | ICD-10-CM

## 2012-11-06 ENCOUNTER — Encounter: Payer: Self-pay | Admitting: Cardiovascular Disease

## 2013-01-12 ENCOUNTER — Other Ambulatory Visit: Payer: Self-pay | Admitting: Cardiovascular Disease

## 2013-01-12 NOTE — Telephone Encounter (Signed)
Rx was sent to pharmacy electronically.

## 2013-03-03 ENCOUNTER — Telehealth: Payer: Self-pay | Admitting: Cardiovascular Disease

## 2013-03-03 NOTE — Telephone Encounter (Signed)
Please call-Can not afford his Xarelto-would like for Dr Claiborne Billings to put him on something else.f

## 2013-03-03 NOTE — Telephone Encounter (Signed)
Returned call.  Pt stated he cannot afford Xarelto.  Stated it costs $140.  Wants to switch to something else.  Stated he has been out for 3-4 days.  Pt informed Dr. Claiborne Billings will be notified for further instructions.  Pt verbalized understanding and agreed w/ plan.  Message forwarded to Dr. Claiborne Billings.

## 2013-03-04 NOTE — Telephone Encounter (Signed)
Per Dr. Claiborne Billings, "If he can't take the new agents due to cost, then coumadin."  Returned call and informed pt per instructions by MD/PA.  Pt verbalized understanding.  Stated he doesn't know why it jumped from fifty-something dollars to $140.  Pt informed he will have to contact his insurance company to find out if there was a change in his formulary or if there is another agent that is covered that will be more affordable or he will have to switch to Coumadin.  Pt verbalized understanding and agreed w/ plan.  Pt will call insurance company and then call back.

## 2013-03-10 MED ORDER — RIVAROXABAN 20 MG PO TABS: 20.0000 mg | ORAL_TABLET | Freq: Every day | ORAL | Status: DC

## 2013-03-10 NOTE — Telephone Encounter (Signed)
No return call from pt since RN out of office.  Call to pt.  Stated he was still working on something b/c he doesn't want to go back on coumadin.  Pt informed samples are still an option.  Pt verbalized understanding and agreed w/ plan.  Pt iwll pick up samples tomorrow.  Samples left at front desk for pt pick up.  Lot: [id number] Exp: 05/2015.

## 2013-04-06 ENCOUNTER — Telehealth: Payer: Self-pay | Admitting: Cardiovascular Disease

## 2013-04-06 MED ORDER — RIVAROXABAN 20 MG PO TABS: 20.0000 mg | ORAL_TABLET | Freq: Every day | ORAL | Status: DC

## 2013-04-06 NOTE — Telephone Encounter (Signed)
Returned call.  Pt asking for samples of Xarelto.  Pt informed samples are available and he is also overdue for an appt.  Pt verbalized understanding and appt scheduled for 10.15.14 at 9:30am w/ Dr. Claiborne Billings for f/u.  Pt was supposed to be seen in May 2014.  Pt will pick up samples tomorrow.  Appt notification left w/ samples.

## 2013-04-06 NOTE — Telephone Encounter (Signed)
Patient has medication questions.

## 2013-04-21 ENCOUNTER — Ambulatory Visit: Payer: PRIVATE HEALTH INSURANCE | Admitting: Cardiovascular Disease

## 2013-04-26 ENCOUNTER — Encounter: Payer: Self-pay | Admitting: Cardiovascular Disease

## 2013-04-26 ENCOUNTER — Encounter (HOSPITAL_COMMUNITY): Payer: Self-pay | Admitting: *Deleted

## 2013-04-26 ENCOUNTER — Ambulatory Visit (INDEPENDENT_AMBULATORY_CARE_PROVIDER_SITE_OTHER): Payer: Medicare Other | Admitting: Cardiovascular Disease

## 2013-04-26 VITALS — BP 114/70 | HR 43 | Ht 72.0 in | Wt 212.3 lb

## 2013-04-26 DIAGNOSIS — I714 Abdominal aortic aneurysm, without rupture, unspecified: Secondary | ICD-10-CM

## 2013-04-26 DIAGNOSIS — R5381 Other malaise: Secondary | ICD-10-CM

## 2013-04-26 DIAGNOSIS — Z79899 Other long term (current) drug therapy: Secondary | ICD-10-CM

## 2013-04-26 DIAGNOSIS — I739 Peripheral vascular disease, unspecified: Secondary | ICD-10-CM | POA: Insufficient documentation

## 2013-04-26 DIAGNOSIS — I350 Nonrheumatic aortic (valve) stenosis: Secondary | ICD-10-CM | POA: Insufficient documentation

## 2013-04-26 DIAGNOSIS — I2510 Atherosclerotic heart disease of native coronary artery without angina pectoris: Secondary | ICD-10-CM

## 2013-04-26 DIAGNOSIS — I359 Nonrheumatic aortic valve disorder, unspecified: Secondary | ICD-10-CM

## 2013-04-26 DIAGNOSIS — E782 Mixed hyperlipidemia: Secondary | ICD-10-CM

## 2013-04-26 DIAGNOSIS — I4892 Unspecified atrial flutter: Secondary | ICD-10-CM | POA: Insufficient documentation

## 2013-04-26 NOTE — Progress Notes (Signed)
Patient ID: Troy Miller, male   DOB: 1940-08-09, 72 y.o.   MRN: [id number]     HPI: Troy Miller is a 72 y.o. male who presents for a nine-month cardiology evaluation. I last saw in January 2014.  Mr. Fuston has a history of coronary artery disease and in 1994 underwent initial CBG revascularization surgery. In May 2005 he underwent redo surgery with a RIMA to the LAD, vein graft to distal right coronary artery by Dr. Pia Mau. His previous left internal mammary artery graft was preserved and supplied the OM1 vessel. He has history of atrial flutter status post ablation by [name] as well as a history of PAF. He has documented peripheral vascular disease with abdominal aortic aneurysm, as well as common iliac artery aneurysms. He status post right from 2 bypass graft surgery. Additional problems include hypertension as well as mixed hyperlipidemia. He also was very mild aortic stenosis. His last echo Doppler study was done in January 2014. His last abdominal aneurysm duplex study was done in August 2013.  Over the past 9 months, he has done well without chest pain. He does note some mild lightheadedness intermittently. He denies anginal symptoms. He denies dyspnea. He denies claudication. He is on combination therapy for mixed hyperlipidemia. He does note intermittent leg swelling typically in the afternoon  Past Medical History  Diagnosis Date  . CAD (coronary artery disease)   . 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 Atrial fibrillation     03/19/10 ablation-[name]  . Atrial flutter   . PVD (peripheral vascular disease)     remote right fem-tib bypass graft sugery  . AAA (abdominal aortic aneurysm)   . Iliac artery aneurysm   . Hypertension   . Hyperlipemia   . Aortic stenosis, mild     07/10/12 EF 50-55% on echo-mitral annular ca+ also    Past Surgical History  Procedure Laterality Date  . Coronary artery bypass graft  1994 & 2005  .  Femoral-tibial bypass graft      Remote  . Back surgery  2003  . Cholecystectomy  08/13/02  . Hernia repair  06/03/07  . Vasectomy  01/28/08  . Cardioversion  08/16/08  . Cardiac electrophysiology study and ablation  03/19/10    Allergies  Allergen Reactions  . Fentanyl And Related Other (See Comments)    Hallucinations    Current Outpatient Prescriptions  Medication Sig Dispense Refill  . aspirin 81 MG tablet Take 81 mg by mouth daily.      . calcium carbonate (OS-CAL) 600 MG TABS Take 600 mg by mouth 2 (two) times daily with a meal.      . cholecalciferol (VITAMIN D) 1000 UNITS tablet Take 1,000 Units by mouth 2 (two) times daily.      . folic acid (FOLVITE) 1 MG tablet Take 1 mg by mouth daily.      . metoprolol tartrate (LOPRESSOR) 25 MG tablet Take 12.5 mg by mouth 2 (two) times daily.       . niacin (NIASPAN) 500 MG CR tablet Take 500 mg by mouth once. Takes 4 tabs at bedtime      . Rivaroxaban (XARELTO) 20 MG TABS tablet Take 1 tablet (20 mg total) by mouth daily.  30 tablet  0  . simvastatin (ZOCOR) 20 MG tablet Take 20 mg by mouth every evening.      . valsartan-hydrochlorothiazide (DIOVAN-HCT) 80-12.5 MG per tablet TAKE 1 TABLET BY MOUTH EVERY DAY  30 tablet  6  . fish oil-omega-3 fatty acids 1000 MG capsule Takes 6 caps daily       No current facility-administered medications for this visit.    History   Social History  . Marital Status: Single    Spouse Name: N/A    Number of Children: N/A  . Years of Education: N/A   Occupational History  . Not on file.   Social History Main Topics  . Smoking status: Former Smoker    Types: Cigarettes    Quit date: 04/27/1987  . Smokeless tobacco: Not on file  . Alcohol Use: No  . Drug Use: No  . Sexual Activity: Not on file   Other Topics Concern  . Not on file   Social History Narrative  . No narrative on file    Family History  Problem Relation Age of Onset  . Cancer Mother   . Heart failure Father   . Stroke  Sister    Socially he is divorced has 3 children and 2 grandchildren. He is not routine exercise. No tobacco or alcohol use.  ROS is negative for fevers, chills or night sweats. He denies significant weight change. He is unaware of recurrent tachypalpitations. At times there is some very mild lightheadedness. He denies chest pressure. He denies change in abdominal symptoms. He denies nausea vomiting or diarrhea. He denies hematuria or hematochezia. He denies claudication. He does note leg swelling typically in the afternoon. He denies rash. He denies myalgias.   Other comprehensive 12 point system review is negative.  PE BP 114/70  Pulse 43  Ht 6' (1.829 m)  Wt 212 lb 4.8 oz (96.299 kg)  BMI 28.79 kg/m2  General: Alert, oriented, no distress.  Skin: normal turgor, no rashes HEENT: Normocephalic, atraumatic. Pupils round and reactive; sclera anicteric;no lid lag.  Nose without nasal septal hypertrophy Mouth/Parynx benign; Mallinpatti scale 3 Neck: No JVD, no carotid briuts Lungs: clear to ausculatation and percussion; no wheezing or rales Heart: RRR, s1 s2 normal 6-0/7 systolic murmur in the aortic region Abdomen: Mild diastases recti; soft, nontender; no hepatosplenomehaly, BS+; abdominal aorta nontender and not dilated by palpation. Pulses 2+ Extremities: no clubbing cyanosis or edema, Homan's sign negative  Neurologic: grossly nonfocal Psychologic: normal affect and mood.  ECG: Marked sinus bradycardia 43 beats per minute. Left axis deviation. 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  LABS:  BMET    Component Value Date/Time   NA 137 03/19/2010 0612   K 4.0 03/19/2010 0612   CL 107 03/19/2010 0612   CO2 23 03/19/2010 0612   GLUCOSE 118* 03/19/2010 0612   BUN 19 03/19/2010 0612   CREATININE 1.14 03/19/2010 0612   CALCIUM 9.8 03/19/2010 0612   GFRNONAA >60 03/19/2010 0612   GFRAA  Value: >60        The eGFR has been calculated using the MDRD equation.  This calculation has not been validated in all clinical situations. eGFR's persistently <60 mL/min signify possible Chronic Kidney Disease. 03/19/2010 0612     Hepatic Function Panel  No results found for this basename: prot, albumin, ast, alt, alkphos, bilitot, bilidir, ibili     CBC    Component Value Date/Time   WBC 8.7 03/19/2010 0612   RBC 5.18 03/19/2010 0612   HGB 16.0 03/19/2010 0612   HCT 48.0 03/19/2010 0612   PLT 150 03/19/2010 0612   MCV 92.7 03/19/2010 0612   MCH 30.9 03/19/2010 0612   MCHC 33.3 03/19/2010 0612  RDW 14.7 03/19/2010 0612   LYMPHSABS 1.7 03/16/2010 0000   MONOABS 1.1* 03/16/2010 0000   EOSABS 0.3 03/16/2010 0000   BASOSABS 0.0 03/16/2010 0000     BNP No results found for this basename: probnp    Lipid Panel  No results found for this basename: chol, trig, hdl, cholhdl, vldl, ldlcalc     RADIOLOGY: No results found.    ASSESSMENT AND PLAN:  Mr. Burak has established artery disease dating back 20 years when he underwent his initial CABG surgery. He is now 9 years status post redo bypass surgery. He does have peripheral vascular disease. His blood pressure is well-controlled on current therapy. He is bradycardic. He has been on a reduced dose of Lopressor at 12.5 twice a day. I have recommended he change this to just daily 12.5 mg in the morning. I am recommending a 17 month followup evaluation of his abdominal aortic aneurysm and iliac artery aneurysms. We will check laboratory the fasting state consisting of this comparison metabolic panel, TSH, CBC, and NMR lipoprofile. I will see him in 6 month followup evaluation prior to that office visit he will undergo chemotherapy followup echo Doppler study to reevaluate his systolic diastolic function and aortic valve stenosis.    Troy Sine, MD, [facility]  04/26/2013 9:47 AM

## 2013-04-26 NOTE — Patient Instructions (Addendum)
Your physician recommends that you return for lab work  Fasting.  Your physician has requested that you have an echocardiogram. Echocardiography is a painless test that uses sound waves to create images of your heart. It provides your doctor with information about the size and shape of your heart and how well your heart's chambers and valves are working. This procedure takes approximately one hour. There are no restrictions for this procedure. This will e done in April of 2015.  Your physician has requested that you have an abdominal aorta duplex. During this test, an ultrasound is used to evaluate the aorta. Allow 30 minutes for this exam. Do not eat after midnight the day before and avoid carbonated beverages.   Your physician recommends that you schedule a follow-up appointment in: 6 MONTHS.  Your physician has recommended you make the following change in your medication: decrease the metoprolol down to 12.5 mg once daily in the morning.

## 2013-04-27 ENCOUNTER — Encounter: Payer: Self-pay | Admitting: Cardiovascular Disease

## 2013-05-05 ENCOUNTER — Other Ambulatory Visit: Payer: Self-pay | Admitting: *Deleted

## 2013-05-05 ENCOUNTER — Ambulatory Visit (HOSPITAL_COMMUNITY)
Admission: RE | Admit: 2013-05-05 | Discharge: 2013-05-05 | Disposition: A | Discharge status: Home / Self Care | Payer: Medicare Other | Source: Ambulatory Visit | Attending: Internal Medicine | Admitting: Internal Medicine

## 2013-05-05 DIAGNOSIS — I714 Abdominal aortic aneurysm, without rupture, unspecified: Secondary | ICD-10-CM | POA: Insufficient documentation

## 2013-05-05 LAB — COMPREHENSIVE METABOLIC PANEL
AST: 19 U/L (ref 0–37)
Alkaline Phosphatase: 68 U/L (ref 39–117)
BUN: 18 mg/dL (ref 6–23)
Calcium: 10.2 mg/dL (ref 8.4–10.5)
Chloride: 103 mEq/L (ref 96–112)
Creat: 1.17 mg/dL (ref 0.50–1.35)
Total Bilirubin: 0.6 mg/dL (ref 0.3–1.2)

## 2013-05-05 LAB — CBC
MCH: 31.3 pg (ref 26.0–34.0)
MCHC: 35 g/dL (ref 30.0–36.0)
Platelets: 187 10*3/uL (ref 150–400)
RDW: 14.1 % (ref 11.5–15.5)

## 2013-05-05 MED ORDER — RIVAROXABAN 20 MG PO TABS: 20.0000 mg | ORAL_TABLET | Freq: Every day | ORAL | Status: DC

## 2013-05-05 NOTE — Progress Notes (Signed)
Abdominal Aortic Duplex Completed [place]

## 2013-05-05 NOTE — Telephone Encounter (Signed)
Walk-In Message  "Need Xarelto."  Per front desk staff, pt requesting samples of Xarelto.    Samples given to pt and asked pt to please call in request prior to arrival in clinic.  Pt verbalized understanding and agreed w/ plan.

## 2013-05-06 LAB — NMR LIPOPROFILE WITH LIPIDS
HDL Size: 8.9 nm — ABNORMAL LOW (ref >=9.2)
LDL Particle Number: <300 nmol/L (ref <1000)
LDL Size: 19.6 nm — ABNORMAL LOW (ref >20.5)
Large VLDL-P: 7.7 nmol/L — ABNORMAL HIGH (ref <=2.7)
Small LDL Particle Number: 229 nmol/L (ref <=527)
VLDL Size: 47.4 nm — ABNORMAL HIGH (ref <=46.6)

## 2013-05-11 ENCOUNTER — Ambulatory Visit (HOSPITAL_COMMUNITY)
Admission: RE | Admit: 2013-05-11 | Discharge: 2013-05-11 | Disposition: A | Discharge status: Home / Self Care | Payer: Medicare Other | Source: Ambulatory Visit | Attending: Internal Medicine | Admitting: Internal Medicine

## 2013-05-11 DIAGNOSIS — I2510 Atherosclerotic heart disease of native coronary artery without angina pectoris: Secondary | ICD-10-CM | POA: Insufficient documentation

## 2013-05-11 NOTE — Progress Notes (Signed)
2D Echo Performed 05/11/2013    [name], RCS

## 2013-06-07 ENCOUNTER — Telehealth: Payer: Self-pay | Admitting: Cardiovascular Disease

## 2013-06-07 MED ORDER — RIVAROXABAN 20 MG PO TABS: 20.0000 mg | ORAL_TABLET | Freq: Every day | ORAL | Status: DC

## 2013-06-07 NOTE — Telephone Encounter (Signed)
Called patient to inform samples will be left at front desk.

## 2013-06-07 NOTE — Telephone Encounter (Signed)
Wants to get results from test and bloodwork from about 1-2 weeks ago.  Also would like to get samples of Xarelto 20 mg  Please call

## 2013-07-06 ENCOUNTER — Telehealth: Payer: Self-pay | Admitting: Cardiovascular Disease

## 2013-07-06 MED ORDER — RIVAROXABAN 20 MG PO TABS: 20.0000 mg | ORAL_TABLET | Freq: Every day | ORAL | Status: DC

## 2013-07-06 NOTE — Telephone Encounter (Signed)
Wants to get samples of Xarelto 20 mg.  Please call

## 2013-07-06 NOTE — Telephone Encounter (Signed)
Returned call.  Left message samples available and to call back tomorrow before 4pm if questions.

## 2013-08-13 ENCOUNTER — Other Ambulatory Visit: Payer: Self-pay | Admitting: Cardiovascular Disease

## 2013-08-13 NOTE — Telephone Encounter (Signed)
Pt was calling back again because he didn't hear back from anyone yet. I told him that he just called and he needs to give the nurse time to go over the message and he then said he needed to know TODAY and then hung up on me.  TK

## 2013-08-13 NOTE — Telephone Encounter (Signed)
Pt needs his medication filled. He said the pharmacist told him that someone told him that he has not been seen in a year and he could not get it fillied. He needs Diovan, simvastatin, Xarelto. He said that he is completely out and needs to know about this today.  TK

## 2013-08-16 MED ORDER — SIMVASTATIN 20 MG PO TABS: 20.0000 mg | ORAL_TABLET | Freq: Every evening | ORAL | Status: DC

## 2013-08-16 MED ORDER — VALSARTAN-HYDROCHLOROTHIAZIDE 80-12.5 MG PO TABS: 1.0000 | ORAL_TABLET | Freq: Every day | ORAL | Status: DC

## 2013-08-16 MED ORDER — RIVAROXABAN 20 MG PO TABS: 20.0000 mg | ORAL_TABLET | Freq: Every day | ORAL | Status: DC

## 2013-08-16 NOTE — Telephone Encounter (Signed)
Please call-said he was told he could not get his medicine.He does not understand ,because he just saw Dr Claiborne Billings in October. He said he needs his Diovan and Gabriel Rainwater is cpmpletly out of it.

## 2013-08-16 NOTE — Telephone Encounter (Signed)
Rx(s) was sent to pharmacy electronically. Samples left at front desk for patient pick-up.  Patient notified.

## 2013-08-16 NOTE — Telephone Encounter (Signed)
Pt was calling in regards to his refills and wants to know why we are not filling them for him. Also wanted to know if he can get samples of Xarelto. He was very upset that he has been calling and he can not get a call back.  TK

## 2013-11-05 ENCOUNTER — Ambulatory Visit (INDEPENDENT_AMBULATORY_CARE_PROVIDER_SITE_OTHER): Payer: Medicare Other | Admitting: Cardiovascular Disease

## 2013-11-05 ENCOUNTER — Encounter: Payer: Self-pay | Admitting: Cardiovascular Disease

## 2013-11-05 VITALS — BP 120/70 | HR 49 | Ht 72.0 in | Wt 213.0 lb

## 2013-11-05 DIAGNOSIS — I498 Other specified cardiac arrhythmias: Secondary | ICD-10-CM

## 2013-11-05 DIAGNOSIS — E782 Mixed hyperlipidemia: Secondary | ICD-10-CM | POA: Insufficient documentation

## 2013-11-05 DIAGNOSIS — I2510 Atherosclerotic heart disease of native coronary artery without angina pectoris: Secondary | ICD-10-CM

## 2013-11-05 DIAGNOSIS — I10 Essential (primary) hypertension: Secondary | ICD-10-CM | POA: Insufficient documentation

## 2013-11-05 DIAGNOSIS — Z7901 Long term (current) use of anticoagulants: Secondary | ICD-10-CM

## 2013-11-05 DIAGNOSIS — I739 Peripheral vascular disease, unspecified: Secondary | ICD-10-CM

## 2013-11-05 DIAGNOSIS — I4892 Unspecified atrial flutter: Secondary | ICD-10-CM

## 2013-11-05 DIAGNOSIS — R001 Bradycardia, unspecified: Secondary | ICD-10-CM | POA: Insufficient documentation

## 2013-11-05 DIAGNOSIS — I4891 Unspecified atrial fibrillation: Secondary | ICD-10-CM

## 2013-11-05 DIAGNOSIS — I350 Nonrheumatic aortic (valve) stenosis: Secondary | ICD-10-CM

## 2013-11-05 DIAGNOSIS — I359 Nonrheumatic aortic valve disorder, unspecified: Secondary | ICD-10-CM

## 2013-11-05 LAB — CBC
HCT: 46.4 % (ref 39.0–52.0)
Hemoglobin: 16.3 g/dL (ref 13.0–17.0)
MCH: 30.9 pg (ref 26.0–34.0)
MCHC: 35.1 g/dL (ref 30.0–36.0)
MCV: 87.9 fL (ref 78.0–100.0)
PLATELETS: 162 10*3/uL (ref 150–400)
RBC: 5.28 MIL/uL (ref 4.22–5.81)
RDW: 15 % (ref 11.5–15.5)
WBC: 10.4 10*3/uL (ref 4.0–10.5)

## 2013-11-05 LAB — COMPREHENSIVE METABOLIC PANEL
ALT: 12 U/L (ref 0–53)
AST: 19 U/L (ref 0–37)
Albumin: 4.7 g/dL (ref 3.5–5.2)
Alkaline Phosphatase: 66 U/L (ref 39–117)
BUN: 17 mg/dL (ref 6–23)
CO2: 28 mEq/L (ref 19–32)
Calcium: 10.3 mg/dL (ref 8.4–10.5)
Chloride: 102 mEq/L (ref 96–112)
Creat: 1.06 mg/dL (ref 0.50–1.35)
Glucose, Bld: 110 mg/dL — ABNORMAL HIGH (ref 70–99)
Potassium: 5.1 mEq/L (ref 3.5–5.3)
Sodium: 137 mEq/L (ref 135–145)
Total Bilirubin: 0.7 mg/dL (ref 0.2–1.2)
Total Protein: 7.8 g/dL (ref 6.0–8.3)

## 2013-11-05 LAB — TSH: TSH: 1.384 u[IU]/mL (ref 0.350–4.500)

## 2013-11-05 NOTE — Patient Instructions (Signed)
Your physician recommends that you return for lab work fasting.  Your physician recommends that you schedule a follow-up appointment in: 6 months.

## 2013-11-05 NOTE — Progress Notes (Signed)
Patient ID: Troy Miller, male   DOB: 06/30/1941, 73 y.o.   MRN: [id number]     HPI: Troy Miller is a 73 y.o. male who presents for a 6 month cardiology evaluation.   [name] has a history of coronary artery disease and in 1994 underwent initial CBG revascularization surgery. In May 2005 he underwent redo surgery with a RIMA to the LAD, vein graft to distal right coronary artery by Dr. Jobie Quaker. His previous left internal mammary artery graft was preserved and supplied the OM1 vessel. He has history of atrial flutter status post ablation by [name] as well as a history of PAF. He has documented peripheral vascular disease with abdominal aortic aneurysm, as well as common iliac artery aneurysms. He status post right fem-tibbypass graft surgery. Additional problems include hypertension as well as mixed hyperlipidemia. He also was very mild aortic stenosis. His last echo Doppler study was done in January 2014. His last abdominal aneurysm duplex study was done in August 2013.  Over the past 6 months, he has done well without chest pain.  2 bradycardia, his Lopressor dose was ultimately weaned and discontinued.  He denies any recurrent palpitations with discontinuance of Lopressor. He does note some mild lightheadedness intermittently. He denies anginal symptoms. He denies dyspnea. He denies claudication. He is on combination therapy for mixed hyperlipidemia. He does note rare leg swelling.  He underwent a followup echo Doppler study on 05/11/2013.  This showed an ejection fraction in the 45-50% range with mild inferior hypokinesis.  There was grade 1 diastolic dysfunction.  His aortic valve was moderately calcified and there was evidence for mild aortic valve stenosis with a peak gradient of 19 and mean gradient of 10. Aortic valve area was 1.5 1.64 cm.  There was mild aortic root dilatation at 42 mm mild dilatation of the ascending aorta..  There was mitral regurgitation.  Past Medical  History  Diagnosis Date  . CAD (coronary artery disease)   . 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 Atrial fibrillation     03/19/10 ablation-[name]  . Atrial flutter   . PVD (peripheral vascular disease)     remote right fem-tib bypass graft sugery  . AAA (abdominal aortic aneurysm)   . Iliac artery aneurysm   . Hypertension   . Hyperlipemia   . Aortic stenosis, mild     07/10/12 EF 50-55% on echo-mitral annular ca+ also    Past Surgical History  Procedure Laterality Date  . Coronary artery bypass graft  1994 & 2005  . Femoral-tibial bypass graft      Remote  . Back surgery  2003  . Cholecystectomy  08/13/02  . Hernia repair  06/03/07  . Vasectomy  01/28/08  . Cardioversion  08/16/08  . Cardiac electrophysiology study and ablation  03/19/10    Allergies  Allergen Reactions  . Fentanyl And Related Other (See Comments)    Hallucinations    Current Outpatient Prescriptions  Medication Sig Dispense Refill  . aspirin 81 MG tablet Take 81 mg by mouth daily.      . calcium carbonate (OS-CAL) 600 MG TABS Take 600 mg by mouth 2 (two) times daily with a meal.      . cholecalciferol (VITAMIN D) 1000 UNITS tablet Take 1,000 Units by mouth 2 (two) times daily.      . fish oil-omega-3 fatty acids 1000 MG capsule Takes 6 caps daily      . folic acid (FOLVITE)  1 MG tablet Take 1 mg by mouth daily.      . niacin (NIASPAN) 500 MG CR tablet Take 500 mg by mouth once. Takes 4 tabs at bedtime      . Rivaroxaban (XARELTO) 20 MG TABS tablet Take 1 tablet (20 mg total) by mouth daily with supper.  15 tablet  0  . simvastatin (ZOCOR) 20 MG tablet Take 1 tablet (20 mg total) by mouth every evening.  30 tablet  8  . valsartan-hydrochlorothiazide (DIOVAN-HCT) 80-12.5 MG per tablet Take 1 tablet by mouth daily.  30 tablet  8   No current facility-administered medications for this visit.    History   Social History  . Marital Status: Single    Spouse Name: N/A     Number of Children: N/A  . Years of Education: N/A   Occupational History  . Not on file.   Social History Main Topics  . Smoking status: Former Smoker    Types: Cigarettes    Quit date: 04/27/1987  . Smokeless tobacco: Not on file  . Alcohol Use: No  . Drug Use: No  . Sexual Activity: Not on file   Other Topics Concern  . Not on file   Social History Narrative  . No narrative on file    Family History  Problem Relation Age of Onset  . Cancer Mother   . Heart failure Father   . Stroke Sister    Socially he is divorced has 3 children and 2 grandchildren. He is not routine exercise. No tobacco or alcohol use.  ROS General: Negative; No fevers, chills, or night sweats;  HEENT: Negative; No changes in vision or hearing, sinus congestion, difficulty swallowing Pulmonary: Negative; No cough, wheezing, shortness of breath, hemoptysis Cardiovascular: Negative; No chest pain, presyncope, syncope, palpatations No change in claudication GI: Negative; No nausea, vomiting, diarrhea, or abdominal pain GU: Negative; No dysuria, hematuria, or difficulty voiding Musculoskeletal: Negative; no myalgias, joint pain, or weakness Hematologic/Oncology: Negative; no easy bruising, bleeding Endocrine: Negative; no heat/cold intolerance; no diabetes Neuro: Occasional transient toe paresthesias; no changes in balance, headaches Skin: Negative; No rashes or skin lesions Psychiatric: Negative; No behavioral problems, depression Sleep: Negative; No snoring, daytime sleepiness, hypersomnolence, bruxism, restless legs, hypnogognic hallucinations, no cataplexy Other comprehensive 14 point system review is negative.  PE BP 120/70  Pulse 49  Ht 6' (1.829 m)  Wt 213 lb (96.616 kg)  BMI 28.88 kg/m2  General: Alert, oriented, no distress.  Skin: normal turgor, no rashes HEENT: Normocephalic, atraumatic. Pupils round and reactive; sclera anicteric;no lid lag.  Nose without nasal septal  hypertrophy Mouth/Parynx benign; Mallinpatti scale 3 Neck: No JVD, probable transmitted murmur to the right lower carotid region; normal carotid upstroke Lungs: clear to ausculatation and percussion; no wheezing or rales Heart: RRR, s1 s2 normal 1-0/0 systolic murmur in the aortic region Abdomen: Mild diastases recti; soft, nontender; no hepatosplenomehaly, BS+; abdominal aorta nontender and not dilated by palpation. Pulses 2+ Extremities: no clubbing cyanosis or edema, Homan's sign negative  Neurologic: grossly nonfocal Psychologic: normal affect and mood.  ECG (independently read by me): Sinus bradycardia at 49 beats per minute.  QTc interval 426 ms.  PR interval 174 ms.  Mild RV conduction delay  Prior 04/26/2013 ECG: Marked sinus bradycardia 43 beats per minute. Left axis deviation. 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  LABS:  BMET    Component Value Date/Time   NA 138 05/05/2013 0839   K 4.5 05/05/2013 7121  CL 103 05/05/2013 0839   CO2 27 05/05/2013 0839   GLUCOSE 102* 05/05/2013 0839   BUN 18 05/05/2013 0839   CREATININE 1.17 05/05/2013 0839   CREATININE 1.14 03/19/2010 0612   CALCIUM 10.2 05/05/2013 0839   GFRNONAA >60 03/19/2010 0612   GFRAA  Value: >60        The eGFR has been calculated using the MDRD equation. This calculation has not been validated in all clinical situations. eGFR's persistently <60 mL/min signify possible Chronic Kidney Disease. 03/19/2010 0612     Hepatic Function Panel     Component Value Date/Time   PROT 7.9 05/05/2013 0839     CBC    Component Value Date/Time   WBC 11.2* 05/05/2013 0839   RBC 5.08 05/05/2013 0839   HGB 15.9 05/05/2013 0839   HCT 45.4 05/05/2013 0839   PLT 187 05/05/2013 0839   MCV 89.4 05/05/2013 0839   MCH 31.3 05/05/2013 0839   MCHC 35.0 05/05/2013 0839   RDW 14.1 05/05/2013 0839   LYMPHSABS 1.7 03/16/2010 0000   MONOABS 1.1* 03/16/2010 0000   EOSABS 0.3 03/16/2010 0000   BASOSABS  0.0 03/16/2010 0000     BNP No results found for this basename: probnp    Lipid Panel  No results found for this basename: chol,  trig,  hdl,  cholhdl,  vldl,  ldlcalc     RADIOLOGY: No results found.    ASSESSMENT AND PLAN:  Mr. Lammert has established artery disease dating back 21 years when he underwent his initial CABG surgery. He is now 10 years status post redo bypass surgery. He has peripheral vascular disease. His blood pressure is well-controlled on current therapy.  As his last office visit, he has been weaned off of beta blocker therapy.  He is bradycardic, but remains asymptomatic with a pulse of 49.  There is no recurrent atrial fibrillation or atrial flutter.  He continues to be on Xarelto for anticoagulation therapy and denies any bleeding.  He also is on combination therapy for his previous mixed hyperlipidemia.  His echo Doppler study shows an ejection fraction of 45-50%.  His aortic stenosis remains mild and is not significantly changed from his prior echo.  His blood pressure today is well-controlled on current therapy with Diovan HCT 80/12.5.  He is fasting today.  A complete set of blood work will be obtained and I will also do an NMR LipoProfile for particle determination and continued aggressive management of his lipids.  He is not having any change in claudication.  I will contact him regarding his laboratory work and if necessary adjustments will be made to his medical regimen.  I will see him in 6 months for cardiology reevaluation.  Troy Sine, MD, [facility]  11/05/2013 8:03 AM

## 2013-11-30 ENCOUNTER — Encounter: Payer: Self-pay | Admitting: *Deleted

## 2014-04-05 ENCOUNTER — Encounter (HOSPITAL_COMMUNITY): Payer: Self-pay | Admitting: *Deleted

## 2014-04-05 ENCOUNTER — Other Ambulatory Visit (HOSPITAL_COMMUNITY): Payer: Self-pay | Admitting: Cardiovascular Disease

## 2014-04-05 DIAGNOSIS — I714 Abdominal aortic aneurysm, without rupture, unspecified: Secondary | ICD-10-CM

## 2014-04-25 ENCOUNTER — Telehealth: Payer: Self-pay | Admitting: Cardiovascular Disease

## 2014-04-25 NOTE — Telephone Encounter (Signed)
Pt would like some samples of Xarelto 20 mg please.

## 2014-04-26 NOTE — Telephone Encounter (Signed)
Spoke to patient. No samples available, informed patient to call back. Patient states he has not had Xarelto for week.He states he went to pharmacy the medication was $150's.he states he called [place] yesterday for the first time.  RN informed patient he should not be out of medication that long.RN called [facility] in [place]. Spoke to Pharmacist Manuela Schwartz),- free 30 day trial offer information given  BIN [id number], GROUP [id number], MEMBER [phone]  RN called patient,informed him 30 tablets are available to him to pick up. Informed patient to discuss with Dr Claiborne Billings at next appointment 05/04/14 Patient verbalized understanding.

## 2014-04-26 NOTE — Telephone Encounter (Signed)
acknowledged

## 2014-04-29 ENCOUNTER — Ambulatory Visit (HOSPITAL_COMMUNITY)
Admission: RE | Admit: 2014-04-29 | Discharge: 2014-04-29 | Disposition: A | Discharge status: Home / Self Care | Payer: Medicare Other | Source: Ambulatory Visit | Attending: Cardiovascular Disease | Admitting: Cardiovascular Disease

## 2014-04-29 DIAGNOSIS — I714 Abdominal aortic aneurysm, without rupture, unspecified: Secondary | ICD-10-CM

## 2014-04-29 NOTE — Progress Notes (Signed)
Aorta Duplex Completed. Oda Cogan, BS, RDMS, RVT

## 2014-05-04 ENCOUNTER — Ambulatory Visit (INDEPENDENT_AMBULATORY_CARE_PROVIDER_SITE_OTHER): Payer: Medicare Other | Admitting: Cardiovascular Disease

## 2014-05-04 ENCOUNTER — Encounter: Payer: Self-pay | Admitting: Cardiovascular Disease

## 2014-05-04 VITALS — BP 118/70 | HR 58 | Ht 72.0 in | Wt 208.0 lb

## 2014-05-04 DIAGNOSIS — I10 Essential (primary) hypertension: Secondary | ICD-10-CM

## 2014-05-04 DIAGNOSIS — E782 Mixed hyperlipidemia: Secondary | ICD-10-CM

## 2014-05-04 DIAGNOSIS — Z7901 Long term (current) use of anticoagulants: Secondary | ICD-10-CM

## 2014-05-04 DIAGNOSIS — I739 Peripheral vascular disease, unspecified: Secondary | ICD-10-CM

## 2014-05-04 DIAGNOSIS — I209 Angina pectoris, unspecified: Secondary | ICD-10-CM

## 2014-05-04 DIAGNOSIS — I25118 Atherosclerotic heart disease of native coronary artery with other forms of angina pectoris: Secondary | ICD-10-CM

## 2014-05-04 DIAGNOSIS — I350 Nonrheumatic aortic (valve) stenosis: Secondary | ICD-10-CM

## 2014-05-04 DIAGNOSIS — I480 Paroxysmal atrial fibrillation: Secondary | ICD-10-CM

## 2014-05-04 DIAGNOSIS — I2510 Atherosclerotic heart disease of native coronary artery without angina pectoris: Secondary | ICD-10-CM

## 2014-05-04 DIAGNOSIS — Z79899 Other long term (current) drug therapy: Secondary | ICD-10-CM

## 2014-05-04 MED ORDER — RIVAROXABAN 20 MG PO TABS: 20.0000 mg | ORAL_TABLET | Freq: Every day | ORAL | Status: DC

## 2014-05-04 NOTE — Patient Instructions (Signed)
Your physician has requested that you have an echocardiogram. Echocardiography is a painless test that uses sound waves to create images of your heart. It provides your doctor with information about the size and shape of your heart and how well your heart's chambers and valves are working. This procedure takes approximately one hour. There are no restrictions for this procedure. This will be done in April 2016.  Your physician recommends that you return for lab work fasting. Do not eat or drink after midnight the night prior to going to the lab. No appointment is needed.  Your physician wants you to follow-up in: 6 months or sooner if needed with Dr. Claiborne Billings.You will receive a reminder letter in the mail two months in advance. If you don't receive a letter, please call our office to schedule the follow-up appointment.

## 2014-05-04 NOTE — Progress Notes (Signed)
Patient ID: Troy Miller, male   DOB: [birth year], 72 y.o.   MRN: [id number]     HPI: Troy Miller is a 73 y.o. male who presents for a 6 month cardiology evaluation.   Troy Miller has a history of coronary artery disease and in 1994 underwent initial CABG revascularization surgery. In May 2005 he underwent redo surgery with a RIMA to the LAD, vein graft to distal right coronary artery by [name]. His previous LIMA graft was preserved and supplied the OM1 vessel. He has history of atrial flutter status post ablation by [name] as well as a history of PAF. He has documented peripheral vascular disease with abdominal aortic aneurysm, as well as common iliac artery aneurysms. He status post right fem-tib bypass graft surgery. Additional problems include hypertension as well as mixed hyperlipidemia. He also has mild aortic stenosis and documentation of an infrarenal abdominal aortic aneurysm.   Over the past year he has done well without chest pain.  Due to  bradycardia, his Lopressor dose was ultimately weaned and discontinued.  He denies any recurrent palpitations with discontinuance of Lopressor. He does note some mild lightheadedness intermittently. He denies anginal symptoms. He denies dyspnea. He denies claudication. He is on combination therapy for mixed hyperlipidemia. He does note rare leg swelling.  A followup echo Doppler study on 05/11/2013 showed an ejection fraction in the 45-50% range with mild inferior hypokinesis.  There was grade 1 diastolic dysfunction.  His aortic valve was moderately calcified and there was evidence for mild aortic valve stenosis with a peak gradient of 19 and mean gradient of 10. Aortic valve area was 1.5 1.64 cm.  There was mild aortic root dilatation at 42 mm mild dilatation of the ascending aorta..  There was mitral regurgitation.  Troy Miller underwent a follow-up abdominal aortic Doppler study April 29, 2014.  He again was noted to have an  infrarenal fusiform aneurysm with current measurements at 3.63.7 with mild amount of atherosclerosis visualized throughout.  The right and left proximal common iliac arteries also demonstrated aneurysmal dilatation.  The right common iliac artery measured 2.3.  A 2.3 without evidence for stenosis.  Left common iliac artery measured 3.2 x 3.0 with mild amount of thrombus and low flow velocity.  He is unaware of any recurrent atrial fibrillation.  He has been maintained on Xarelto for anticoagulation.  Although he had been scheduled for follow-up NMR profile 6 months ago.  This apparently was never done.  He has continued to be on niacin, Zocor and fish oil therapy.  Past Medical History  Diagnosis Date  . CAD (coronary artery disease)   . 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 Atrial fibrillation     03/19/10 ablation-[name]  . Atrial flutter   . PVD (peripheral vascular disease)     remote right fem-tib bypass graft sugery  . AAA (abdominal aortic aneurysm)   . Iliac artery aneurysm   . Hypertension   . Hyperlipemia   . Aortic stenosis, mild     07/10/12 EF 50-55% on echo-mitral annular ca+ also    Past Surgical History  Procedure Laterality Date  . Coronary artery bypass graft  1994 & 2005  . Femoral-tibial bypass graft      Remote  . Back surgery  2003  . Cholecystectomy  08/13/02  . Hernia repair  06/03/07  . Vasectomy  01/28/08  . Cardioversion  08/16/08  . Cardiac electrophysiology study and ablation  03/19/10    Allergies  Allergen Reactions  . Fentanyl And Related Other (See Comments)    Hallucinations    Current Outpatient Prescriptions  Medication Sig Dispense Refill  . aspirin 81 MG tablet Take 81 mg by mouth daily.      . calcium carbonate (OS-CAL) 600 MG TABS Take 600 mg by mouth 2 (two) times daily with a meal.      . cholecalciferol (VITAMIN D) 1000 UNITS tablet Take 1,000 Units by mouth 2 (two) times daily.      . fish oil-omega-3  fatty acids 1000 MG capsule Takes 6 caps daily      . folic acid (FOLVITE) 1 MG tablet Take 1 mg by mouth daily.      . niacin (NIASPAN) 500 MG CR tablet Take 500 mg by mouth once. Takes 4 tabs at bedtime      . Rivaroxaban (XARELTO) 20 MG TABS tablet Take 1 tablet (20 mg total) by mouth daily with supper.  15 tablet  0  . simvastatin (ZOCOR) 20 MG tablet Take 1 tablet (20 mg total) by mouth every evening.  30 tablet  8  . valsartan-hydrochlorothiazide (DIOVAN-HCT) 80-12.5 MG per tablet Take 1 tablet by mouth daily.  30 tablet  8   No current facility-administered medications for this visit.    History   Social History  . Marital Status: Single    Spouse Name: N/A    Number of Children: N/A  . Years of Education: N/A   Occupational History  . Not on file.   Social History Main Topics  . Smoking status: Former Smoker    Types: Cigarettes    Quit date: 04/27/1987  . Smokeless tobacco: Not on file  . Alcohol Use: No  . Drug Use: No  . Sexual Activity: Not on file   Other Topics Concern  . Not on file   Social History Narrative  . No narrative on file    Family History  Problem Relation Age of Onset  . Cancer Mother   . Heart failure Father   . Stroke Sister    Socially he is divorced has 3 children and 2 grandchildren. He is not routine exercise. No tobacco or alcohol use.  ROS General: Negative; No fevers, chills, or night sweats;  HEENT: Negative; No changes in vision or hearing, sinus congestion, difficulty swallowing Pulmonary: Negative; No cough, wheezing, shortness of breath, hemoptysis Cardiovascular: Negative; No chest pain, presyncope, syncope, palpatations No change in claudication GI: Negative; No nausea, vomiting, diarrhea, or abdominal pain GU: Negative; No dysuria, hematuria, or difficulty voiding Musculoskeletal: Negative; no myalgias, joint pain, or weakness Hematologic/Oncology: Negative; no easy bruising, bleeding Endocrine: Negative; no heat/cold  intolerance; no diabetes Neuro: Occasional transient toe paresthesias; no changes in balance, headaches Skin: Negative; No rashes or skin lesions Psychiatric: Negative; No behavioral problems, depression Sleep: Negative; No snoring, daytime sleepiness, hypersomnolence, bruxism, restless legs, hypnogognic hallucinations, no cataplexy Other comprehensive 14 point system review is negative.  PE BP 118/70  Pulse 58  Ht 6' (1.829 m)  Wt 208 lb (94.348 kg)  BMI 28.20 kg/m2  General: Alert, oriented, no distress.  Skin: normal turgor, no rashes HEENT: Normocephalic, atraumatic. Pupils round and reactive; sclera anicteric;no lid lag.  Nose without nasal septal hypertrophy Mouth/Parynx benign; Mallinpatti scale 3 Neck: No JVD, probable transmitted murmur to the right lower carotid region; normal carotid upstroke Lungs: clear to ausculatation and percussion; no wheezing or rales Heart: RRR, s1 s2 normal 5-4/6 systolic murmur  in the aortic region Abdomen: Mild diastases recti; soft, nontender; no hepatosplenomehaly, BS+; abdominal aorta nontender and not dilated by palpation. Pulses 2+ Extremities: no clubbing cyanosis or edema, Homan's sign negative  Neurologic: grossly nonfocal Psychologic: normal affect and mood.  ECG (independently read by me): Sinus bradycardia at 58 bpm with an isolate a PVC and occasional PACs with mild sinus arrhythmia.  Evidence for prior inferior posterior infarct.  Nonspecific ST changes.  Prior 11/05/2013 ECG (independently read by me): Sinus bradycardia at 49 beats per minute.  QTc interval 426 ms.  PR interval 174 ms.  Mild RV conduction delay  Prior 04/26/2013 ECG: Marked sinus bradycardia 43 beats per minute. Left axis deviation. 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  LABS:  BMET    Component Value Date/Time   NA 137 11/05/2013 0812   K 5.1 11/05/2013 0812   CL 102 11/05/2013 0812   CO2 28 11/05/2013 0812   GLUCOSE 110* 11/05/2013  0812   BUN 17 11/05/2013 0812   CREATININE 1.06 11/05/2013 0812   CREATININE 1.14 03/19/2010 0612   CALCIUM 10.3 11/05/2013 0812   GFRNONAA >60 03/19/2010 0612   GFRAA  Value: >60        The eGFR has been calculated using the MDRD equation. This calculation has not been validated in all clinical situations. eGFR's persistently <60 mL/min signify possible Chronic Kidney Disease. 03/19/2010 0612     Hepatic Function Panel     Component Value Date/Time   PROT 7.8 11/05/2013 0812     CBC    Component Value Date/Time   WBC 10.4 11/05/2013 0812   RBC 5.28 11/05/2013 0812   HGB 16.3 11/05/2013 0812   HCT 46.4 11/05/2013 0812   PLT 162 11/05/2013 0812   MCV 87.9 11/05/2013 0812   MCH 30.9 11/05/2013 0812   MCHC 35.1 11/05/2013 0812   RDW 15.0 11/05/2013 0812   LYMPHSABS 1.7 03/16/2010 0000   MONOABS 1.1* 03/16/2010 0000   EOSABS 0.3 03/16/2010 0000   BASOSABS 0.0 03/16/2010 0000     BNP No results found for this basename: probnp    Lipid Panel  No results found for this basename: chol,  trig,  hdl,  cholhdl,  vldl,  ldlcalc     RADIOLOGY: No results found.    ASSESSMENT AND PLAN:  Mr. Brunsman has established artery disease dating back 21 years when he underwent his initial CABG surgery. He is 10 years status post redo bypass surgery. He has peripheral vascular disease.  His blood pressure today is controlled at 118/70 supine, and there was no evidence for orthostatic hypotension with a blood pressure 116/70 standing.  He is on valsartan HCT 80/12.5.  There are no signs of edema.  He is not having any recurrent anginal symptomatology following his previous bypass surgery.  His last nuclear perfusion study was in January 2014, which revealed inferior nontransmural scar with an EF of 49% with mild apical hypocontractility.  He is maintaining sinus rhythm without recurrent AF and continues to be on anticoagulation with [place].  I reviewed his abdominal duplex scan.  His abdominal aortic aneurysm is unchanged  from 2013.  He has bilateral iliac artery aneurysms.  He does have aortic stenosis which, on his last echo 1 year ago is still in the mild range.  I am recommending laboratory be obtained with a competent metabolic panel, CBC, and lipid studies.  I will see him in 6 months for reevaluation, and prior to that office visit.  He will likely undergo a follow-up echo Doppler study to reassess systolic, diastolic function as well as his previous aortic stenosis.  In 1 year we will consider repeating a nuclear perfusion scan.    Troy Sine, MD, [facility]  05/04/2014 8:34 AM

## 2014-05-13 ENCOUNTER — Telehealth: Payer: Self-pay | Admitting: *Deleted

## 2014-05-13 NOTE — Telephone Encounter (Signed)
-----  Message from Troy Sine, MD sent at 05/11/2014  8:30 AM EST ----- Stable; 3.6x3.7 AAA

## 2014-05-13 NOTE — Telephone Encounter (Signed)
Called patient and notfied him of AAA duplex results.

## 2014-05-20 LAB — LIPID PANEL
Cholesterol: 103 mg/dL (ref 0–200)
HDL: 39 mg/dL — ABNORMAL LOW (ref >39)
LDL Cholesterol: 37 mg/dL (ref 0–99)
TRIGLYCERIDES: 135 mg/dL (ref <150)
Total CHOL/HDL Ratio: 2.6 Ratio
VLDL: 27 mg/dL (ref 0–40)

## 2014-05-20 LAB — COMPREHENSIVE METABOLIC PANEL
ALBUMIN: 4.6 g/dL (ref 3.5–5.2)
ALT: 13 U/L (ref 0–53)
AST: 21 U/L (ref 0–37)
Alkaline Phosphatase: 72 U/L (ref 39–117)
BILIRUBIN TOTAL: 0.8 mg/dL (ref 0.2–1.2)
BUN: 23 mg/dL (ref 6–23)
CO2: 24 mEq/L (ref 19–32)
Calcium: 10.3 mg/dL (ref 8.4–10.5)
Chloride: 105 mEq/L (ref 96–112)
Creat: 1.21 mg/dL (ref 0.50–1.35)
GLUCOSE: 103 mg/dL — AB (ref 70–99)
POTASSIUM: 4.9 meq/L (ref 3.5–5.3)
SODIUM: 139 meq/L (ref 135–145)
TOTAL PROTEIN: 7.8 g/dL (ref 6.0–8.3)

## 2014-05-20 LAB — CBC
HCT: 46.8 % (ref 39.0–52.0)
Hemoglobin: 16.1 g/dL (ref 13.0–17.0)
MCH: 31.1 pg (ref 26.0–34.0)
MCHC: 34.4 g/dL (ref 30.0–36.0)
MCV: 90.3 fL (ref 78.0–100.0)
Platelets: 194 10*3/uL (ref 150–400)
RBC: 5.18 MIL/uL (ref 4.22–5.81)
RDW: 14.6 % (ref 11.5–15.5)
WBC: 11 10*3/uL — ABNORMAL HIGH (ref 4.0–10.5)

## 2014-05-27 ENCOUNTER — Encounter: Payer: Self-pay | Admitting: *Deleted

## 2014-05-30 ENCOUNTER — Telehealth: Payer: Self-pay | Admitting: Cardiovascular Disease

## 2014-05-30 NOTE — Telephone Encounter (Signed)
Pt would lab results from 05-20-14 please.

## 2014-05-30 NOTE — Telephone Encounter (Signed)
Pt. Informed that results were mailed out on the 20th and results were also sent to Dr. Vevelyn Royals

## 2014-07-12 ENCOUNTER — Telehealth: Payer: Self-pay | Admitting: Cardiovascular Disease

## 2014-07-12 NOTE — Telephone Encounter (Signed)
Pt. To come in for samples of xarelto

## 2014-07-12 NOTE — Telephone Encounter (Signed)
Pt need some samples of Xarelto please.

## 2014-08-01 ENCOUNTER — Telehealth: Payer: Self-pay | Admitting: Cardiovascular Disease

## 2014-08-01 MED ORDER — RIVAROXABAN 20 MG PO TABS: 20.0000 mg | ORAL_TABLET | Freq: Every day | ORAL | Status: DC

## 2014-08-01 NOTE — Telephone Encounter (Signed)
Samples at front. Informed patient. Pt voiced understanding, no other stated concerns at this time.

## 2014-08-01 NOTE — Telephone Encounter (Signed)
Pt would like some samples of Xarelto please. °

## 2014-08-17 ENCOUNTER — Telehealth: Payer: Self-pay | Admitting: Cardiovascular Disease

## 2014-08-17 MED ORDER — RIVAROXABAN 20 MG PO TABS: 20.0000 mg | ORAL_TABLET | Freq: Every day | ORAL | Status: DC

## 2014-08-17 NOTE — Telephone Encounter (Signed)
Pt would like some samples of Xarelto please. °

## 2014-08-17 NOTE — Telephone Encounter (Signed)
Pt informed samples available at desk.

## 2014-09-06 ENCOUNTER — Other Ambulatory Visit: Payer: Self-pay

## 2014-09-06 MED ORDER — SIMVASTATIN 20 MG PO TABS: 20.0000 mg | ORAL_TABLET | Freq: Every evening | ORAL | Status: DC

## 2014-09-06 NOTE — Telephone Encounter (Signed)
Rx(s) sent to pharmacy electronically.

## 2014-09-07 ENCOUNTER — Telehealth: Payer: Self-pay | Admitting: Cardiovascular Disease

## 2014-09-07 MED ORDER — RIVAROXABAN 20 MG PO TABS: 20.0000 mg | ORAL_TABLET | Freq: Every day | ORAL | Status: DC

## 2014-09-07 NOTE — Telephone Encounter (Signed)
Pt has deductible to meet - unsure of how much. Regardless, he states he can't pay $300 for 30 day supply.  Not sure what is covered. He is on an [place]   He called for samples, we have 3 left.  Discussed situation w Cyril Mourning, she suggested 30-day trial card & that it may go against his deductible (and to explain this to patient).  I did have this conversation w/ the patient and he seemed to voice understanding. Left samples and offer card at front desk.

## 2014-09-07 NOTE — Telephone Encounter (Signed)
Pt is calling in wanting some samples of Xarelto 66m . Please f/u with pt  Thanks

## 2014-09-07 NOTE — Telephone Encounter (Signed)
Pt says that he is fresh out and would like to know if there is something else he can take in the place of the Xarelto since he can no longer afford the medication at the store.

## 2014-09-08 ENCOUNTER — Other Ambulatory Visit: Payer: Self-pay

## 2014-09-08 MED ORDER — VALSARTAN-HYDROCHLOROTHIAZIDE 80-12.5 MG PO TABS: 1.0000 | ORAL_TABLET | Freq: Every day | ORAL | Status: DC

## 2014-09-27 ENCOUNTER — Other Ambulatory Visit: Payer: Self-pay | Admitting: Pharmacist Clinician (PhC)/ Clinical Pharmacy Specialist

## 2014-09-27 ENCOUNTER — Telehealth: Payer: Self-pay | Admitting: Cardiovascular Disease

## 2014-09-27 MED ORDER — RIVAROXABAN 20 MG PO TABS: 20.0000 mg | ORAL_TABLET | Freq: Every day | ORAL | Status: DC

## 2014-09-27 NOTE — Telephone Encounter (Signed)
Samples at front, pt informed.

## 2014-09-27 NOTE — Telephone Encounter (Signed)
Pt need some samples of Xarelto please.

## 2014-10-18 ENCOUNTER — Other Ambulatory Visit: Payer: Self-pay | Admitting: Cardiovascular Disease

## 2014-10-18 MED ORDER — RIVAROXABAN 20 MG PO TABS: 20.0000 mg | ORAL_TABLET | Freq: Every day | ORAL | Status: DC

## 2014-10-18 NOTE — Telephone Encounter (Signed)
Pt would like some samples of Xarelto please.

## 2014-10-18 NOTE — Telephone Encounter (Signed)
Samples left at front desk for patient. Patient notified

## 2014-11-04 ENCOUNTER — Ambulatory Visit (HOSPITAL_COMMUNITY)
Admission: RE | Admit: 2014-11-04 | Discharge: 2014-11-04 | Disposition: A | Discharge status: Home / Self Care | Payer: Medicare Other | Source: Ambulatory Visit | Attending: Internal Medicine | Admitting: Internal Medicine

## 2014-11-04 DIAGNOSIS — I10 Essential (primary) hypertension: Secondary | ICD-10-CM | POA: Insufficient documentation

## 2014-11-04 DIAGNOSIS — I714 Abdominal aortic aneurysm, without rupture: Secondary | ICD-10-CM | POA: Diagnosis not present

## 2014-11-04 DIAGNOSIS — Z8249 Family history of ischemic heart disease and other diseases of the circulatory system: Secondary | ICD-10-CM | POA: Diagnosis not present

## 2014-11-04 DIAGNOSIS — I350 Nonrheumatic aortic (valve) stenosis: Secondary | ICD-10-CM | POA: Diagnosis not present

## 2014-11-04 DIAGNOSIS — E785 Hyperlipidemia, unspecified: Secondary | ICD-10-CM | POA: Insufficient documentation

## 2014-11-04 DIAGNOSIS — I480 Paroxysmal atrial fibrillation: Secondary | ICD-10-CM

## 2014-11-04 DIAGNOSIS — I739 Peripheral vascular disease, unspecified: Secondary | ICD-10-CM | POA: Insufficient documentation

## 2014-11-04 DIAGNOSIS — I2510 Atherosclerotic heart disease of native coronary artery without angina pectoris: Secondary | ICD-10-CM | POA: Insufficient documentation

## 2014-11-04 NOTE — Progress Notes (Signed)
2D Echocardiogram Complete.  11/04/2014   Troy Miller, RDCS

## 2014-11-21 ENCOUNTER — Encounter: Payer: Self-pay | Admitting: *Deleted

## 2015-01-30 ENCOUNTER — Telehealth: Payer: Self-pay | Admitting: Cardiovascular Disease

## 2015-01-30 NOTE — Telephone Encounter (Signed)
Pt c/o Shortness Of Breath: STAT if SOB developed within the last 24 hours or pt is noticeably SOB on the phone  1. Are you currently SOB (can you hear that pt is SOB on the phone)? Some   2. How long have you been experiencing SOB? He states about 2 wks   3. Are you SOB when sitting or when up moving around? Moving around   4. Are you currently experiencing any other symptoms? Pt denies any other symptoms

## 2015-01-30 NOTE — Telephone Encounter (Signed)
Patient experiencing recent worsening of DOE/SOB and irregular heart rate. Unsure of BP/HR values. States he does not have any weight gain. "I just can't do anything without getting real winded." Scheduled to see FLEX provider, [name], PA, tomorrow, July 26th at 3:30pm. Notified patient of scheduled appt and he confirmed that he could be there and that he knew where the office was located. Provided him with address and phone number though, in case he needed it for tomorrow.

## 2015-01-30 NOTE — Progress Notes (Signed)
Cardiology Office Note   Date:  01/31/2015   ID:  Troy Miller, DOB 28-Jul-1940, MRN [id number]  PCP:  [name], PA-C  Cardiologist:  Dr. Shelva Majestic     Chief Complaint  Patient presents with  . Shortness of Breath     History of Present Illness: Troy Miller is a 74 y.o. male with a hx of CAD s/p CABG in 1994 and redo CABG in May 2005 (RIMA to the LAD, SVG-distal RCA by [name] - his previous LIMA graft was preserved and supplied the OM1 vessel), AFlutter s/p ablation by 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  Metoprolol has been DC'd in the past due to bradycardia.    Echo in 05/11/2013 showed an ejection fraction 45-50%.  Echo in 4/16 demonstrated EF 50-55%, inf HK, mild AS with mean gradient 14 mmHg.  FU abdominal aortic Doppler study April 29, 2014 demonstrated an infrarenal fusiform aneurysm with current measurements at 3.63.7 with mild amount of atherosclerosis visualized throughout. The right and left proximal common iliac arteries also demonstrated aneurysmal dilatation. The right common iliac artery measured 2.3. A 2.3 without evidence for stenosis. Left common iliac artery measured 3.2 x 3.0 with mild amount of thrombus and low flow velocity.  Last seen by Dr. Shelva Majestic 04/2014.    He called in yesterday with shortness of breath and is added on to my schedule today for evaluation.  He is here alone today.  He notes DOE over the past 2 weeks. It is not getting any worse.  He is NYHA 2b.  Denies orthopnea, PND.  He has chronic LE edema without change.  He has not noticed a significant weight gain.  He denies coughing or wheezing.  He smoked for 30 years and quit in 1988.  Denies melena, hematochezia, hematuria.   Studies/Reports Reviewed Today:  Echo 11/04/14 - Left ventricle: Inferobasal HK. EF 50% to 55%.  Wall motion was normal;   -  Aortic valve: There was mild stenosis. Valve area (VTI): 1.49cm^2. Valve area (Vmax): 1.43 cm^2. Valve area (Vmean): 1.43cm^2.  Mean gradient 14 mmHg, peak gradient 32 mmHg  - Mitral valve: Calcified annulus. - Left atrium: The atrium was mildly dilated. - Atrial septum: No defect or patent foramen ovale was identified.  Abdominal US 04/2014 AAA 3.6 x 3.7 cm bilat CIA aneurysmal dilatation  Myoview 07/2012 Low Risk; inf scar, mild apical anterior ischemia, EF 49%   Past Medical History  Diagnosis Date  . CAD (coronary artery disease)   . 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 Atrial fibrillation     03/19/10 ablation-[name]  . Atrial flutter   . PVD (peripheral vascular disease)     remote right fem-tib bypass graft sugery  . AAA (abdominal aortic aneurysm)   . Iliac artery aneurysm   . Hypertension   . Hyperlipemia   . Aortic stenosis, mild     07/10/12 EF 50-55% on echo-mitral annular ca+ also    Past Surgical History  Procedure Laterality Date  . Coronary artery bypass graft  1994 & 2005  . Femoral-tibial bypass graft      Remote  . Back surgery  2003  . Cholecystectomy  08/13/02  . Hernia repair  06/03/07  . Vasectomy  01/28/08  . Cardioversion  08/16/08  . Cardiac electrophysiology study and ablation  03/19/10  Current Outpatient Prescriptions  Medication Sig Dispense Refill  . aspirin 81 MG tablet Take 81 mg by mouth daily.    . calcium carbonate (OS-CAL) 600 MG TABS Take 600 mg by mouth 2 (two) times daily with a meal.    . cholecalciferol (VITAMIN D) 1000 UNITS tablet Take 1,000 Units by mouth 2 (two) times daily.    . fish oil-omega-3 fatty acids 1000 MG capsule Takes 6 caps daily    . folic acid (FOLVITE) 1 MG tablet Take 1 mg by mouth daily.    . niacin (NIASPAN) 500 MG CR tablet Take 500 mg by mouth once. Takes 4 tabs at bedtime    . rivaroxaban (XARELTO) 20 MG TABS tablet Take 1 tablet (20 mg total) by mouth daily with supper.  25 tablet 0  . simvastatin (ZOCOR) 20 MG tablet Take 1 tablet (20 mg total) by mouth every evening. 30 tablet 7  . valsartan-hydrochlorothiazide (DIOVAN-HCT) 80-12.5 MG per tablet Take 1 tablet by mouth daily. 30 tablet 7   No current facility-administered medications for this visit.    Allergies:   Fentanyl and related    Social History:  The patient  reports that he quit smoking about 27 years ago. His smoking use included Cigarettes. He does not have any smokeless tobacco history on file. He reports that he does not drink alcohol or use illicit drugs.   Family History:  The patient's family history includes Cancer in his mother; Heart failure in his father; Stroke in his sister.    ROS:   Please see the history of present illness.   Review of Systems  Constitution: Positive for malaise/fatigue and weight gain.  Eyes: Positive for visual disturbance.  Cardiovascular: Positive for dyspnea on exertion, irregular heartbeat and leg swelling.  Hematologic/Lymphatic: Bruises/bleeds easily.  Neurological: Positive for dizziness.  All other systems reviewed and are negative.     PHYSICAL EXAM: VS:  BP 112/60 mmHg  Pulse 63  Ht 6' (1.829 m)  Wt 213 lb 3.2 oz (96.707 kg)  BMI 28.91 kg/m2  SpO2 94%    Wt Readings from Last 3 Encounters:  01/31/15 213 lb 3.2 oz (96.707 kg)  05/04/14 208 lb (94.348 kg)  11/05/13 213 lb (96.616 kg)    O2 at rest on RA:  94% O2 with ambulation on RA:  89%  GEN: Well nourished, well developed, in no acute distress HEENT: normal Neck: no JVD, no carotid bruits, no masses Cardiac:  Normal S1/S2, RRR; no murmur ,  no rubs or gallops, no edema   Respiratory:  Decreased breath sounds bilaterally, no wheezing, rhonchi or rales. GI: soft, nontender, nondistended, + BS MS: no deformity or atrophy Skin: warm and dry  Neuro:  CNs II-XII intact, Strength and sensation are intact Psych: Normal affect   EKG:  EKG is ordered today.  It demonstrates:   NSR,  HR 63, inferior Q waves, nonspecific ST-T wave changes, no change from prior tracing   Recent Labs: 05/20/2014: ALT 13; BUN 23; Creat 1.21; Hemoglobin 16.1; Platelets 194; Potassium 4.9; Sodium 139    Lipid Panel    Component Value Date/Time   CHOL 103 05/20/2014 0823   CHOL 118 05/05/2013 0839   TRIG 135 05/20/2014 0823   TRIG 202* 05/05/2013 0839   HDL 39* 05/20/2014 0823   HDL 38* 05/05/2013 0839   CHOLHDL 2.6 05/20/2014 0823   VLDL 27 05/20/2014 0823   LDLCALC 37 05/20/2014 0823   LDLCALC 40 05/05/2013 0839  ASSESSMENT AND PLAN:  Shortness of Breath:  The patient presents with symptoms of DOE over the past 2 weeks.  He does not really remember what his symptoms were like prior to his CABG. He thinks he probably was SOB.  He really does not look volume overloaded on exam.  His EF has been low normal in the past.  He has a significant hx of smoking but quit many years ago.  His O2 does drop some with ambulation.  But this does not drop below 89%.  Recent echo with mild AS.  -  Will get Lexiscan-Myoview.  -  BMET, CBC, TSH, BNP.  -  Get CXR  -  Consider Pulmonology evaluation if Cardiac evaluation unremarkable.   Coronary artery disease involving native coronary artery of native heart without angina pectoris:  No chest pain.  However, he has DOE.  Question if this is an anginal equivalent.  Will proceed with Lexiscan-Myoview as noted. Continue statin, angiotensin receptor blocker, ASA.  Atrial flutter, unspecified:  S/p RFCA.  PAF (paroxysmal atrial fibrillation):  Maintaining NSR.  Continue Xarelto.  Check CBC.  PVD (peripheral vascular disease):  No claudication.  Mild aortic stenosis:  Mild gradients on Echo in 10/2014.    Essential hypertension:  BP controlled.   Mixed hyperlipidemia:  Continue statin.     Medication Changes: Current medicines are reviewed at length with the patient today.  Concerns regarding medicines are as outlined above.  The following  changes have been made:   Discontinued Medications   No medications on file   Modified Medications   No medications on file   New Prescriptions   No medications on file    Labs/ tests ordered today include:   Orders Placed This Encounter  Procedures  . DG Chest 2 View  . Basic Metabolic Panel (BMET)  . CBC w/Diff  . B Nat Peptide  . TSH  . Myocardial Perfusion Imaging     Disposition:   We tried to get him in to see Dr. Shelva Majestic in 2 weeks for FU.  There are no appointments available with Dr. Claiborne Billings or a PA/NP.  I will see him back in 2 weeks.    Signed, Versie Starks, MHS 01/31/2015 4:23 PM    [facility] [address], [place], [place]  [address] Phone: [phone]; Fax: [phone]

## 2015-01-31 ENCOUNTER — Encounter: Payer: Self-pay | Admitting: Physician Assistant

## 2015-01-31 ENCOUNTER — Ambulatory Visit (INDEPENDENT_AMBULATORY_CARE_PROVIDER_SITE_OTHER): Payer: Medicare Other | Admitting: Physician Assistant

## 2015-01-31 VITALS — BP 112/60 | HR 63 | Ht 72.0 in | Wt 213.2 lb

## 2015-01-31 DIAGNOSIS — I4892 Unspecified atrial flutter: Secondary | ICD-10-CM | POA: Diagnosis not present

## 2015-01-31 DIAGNOSIS — I739 Peripheral vascular disease, unspecified: Secondary | ICD-10-CM | POA: Diagnosis not present

## 2015-01-31 DIAGNOSIS — I480 Paroxysmal atrial fibrillation: Secondary | ICD-10-CM

## 2015-01-31 DIAGNOSIS — I2510 Atherosclerotic heart disease of native coronary artery without angina pectoris: Secondary | ICD-10-CM

## 2015-01-31 DIAGNOSIS — I10 Essential (primary) hypertension: Secondary | ICD-10-CM

## 2015-01-31 DIAGNOSIS — R0602 Shortness of breath: Secondary | ICD-10-CM

## 2015-01-31 DIAGNOSIS — E782 Mixed hyperlipidemia: Secondary | ICD-10-CM

## 2015-01-31 DIAGNOSIS — I350 Nonrheumatic aortic (valve) stenosis: Secondary | ICD-10-CM

## 2015-01-31 NOTE — Patient Instructions (Addendum)
Medication Instructions:  -No Change  Labwork: Your physician recommends that you have  lab work today: bmet/cbc/tsh/bnp   Testing/Procedures: Your physician has requested that you have a lexiscan myoview. For further information please visit [email or web address]. Please follow instruction sheet, as given.  A chest x-ray takes a picture of the organs and structures inside the chest, including the heart, lungs, and blood vessels. This test can show several things, including, whether the heart is enlarges; whether fluid is building up in the lungs; and whether pacemaker / defibrillator leads are still in place. Go to [facility]   Follow-Up: Your physician recommends that you keep your scheduled  follow-up appointment with [name]  Any Other Special Instructions Will Be Listed Below (If Applicable).

## 2015-02-01 ENCOUNTER — Telehealth: Payer: Self-pay | Admitting: *Deleted

## 2015-02-01 DIAGNOSIS — I10 Essential (primary) hypertension: Secondary | ICD-10-CM

## 2015-02-01 LAB — CBC WITH DIFFERENTIAL/PLATELET
BASOS PCT: 0.3 % (ref 0.0–3.0)
Basophils Absolute: 0 10*3/uL (ref 0.0–0.1)
Eosinophils Absolute: 0.4 10*3/uL (ref 0.0–0.7)
Eosinophils Relative: 3.3 % (ref 0.0–5.0)
HCT: 47.4 % (ref 39.0–52.0)
HEMOGLOBIN: 16.1 g/dL (ref 13.0–17.0)
LYMPHS ABS: 2.8 10*3/uL (ref 0.7–4.0)
LYMPHS PCT: 21.6 % (ref 12.0–46.0)
MCHC: 33.9 g/dL (ref 30.0–36.0)
MCV: 92.6 fl (ref 78.0–100.0)
MONOS PCT: 12.3 % — AB (ref 3.0–12.0)
Monocytes Absolute: 1.6 10*3/uL — ABNORMAL HIGH (ref 0.1–1.0)
NEUTROS ABS: 8.2 10*3/uL — AB (ref 1.4–7.7)
Neutrophils Relative %: 62.5 % (ref 43.0–77.0)
PLATELETS: 175 10*3/uL (ref 150.0–400.0)
RBC: 5.12 Mil/uL (ref 4.22–5.81)
RDW: 14.9 % (ref 11.5–15.5)
WBC: 13.2 10*3/uL — AB (ref 4.0–10.5)

## 2015-02-01 LAB — BASIC METABOLIC PANEL
BUN: 24 mg/dL — ABNORMAL HIGH (ref 6–23)
CALCIUM: 10.2 mg/dL (ref 8.4–10.5)
CO2: 23 meq/L (ref 19–32)
CREATININE: 1.52 mg/dL — AB (ref 0.40–1.50)
Chloride: 105 mEq/L (ref 96–112)
GFR: 47.62 mL/min — AB (ref >60.00)
Glucose, Bld: 94 mg/dL (ref 70–99)
Potassium: 4.5 mEq/L (ref 3.5–5.1)
Sodium: 137 mEq/L (ref 135–145)

## 2015-02-01 LAB — TSH: TSH: 3.04 u[IU]/mL (ref 0.35–4.50)

## 2015-02-01 LAB — BRAIN NATRIURETIC PEPTIDE: Pro B Natriuretic peptide (BNP): 79 pg/mL (ref 0.0–100.0)

## 2015-02-01 NOTE — Telephone Encounter (Signed)
Pt notified of lab results, is aware to push fluids, BMET 02-10-15 @ time of stress test..pt verbalized understanding.Marland [id number]

## 2015-02-01 NOTE — Telephone Encounter (Signed)
-----  Message from Liliane Shi, [place] sent at 02/01/2015  1:35 PM EDT ----- K+ ok Creatinine higher than previous BNP ok (no heart failure) TSH ok Hgb ok WBC elevated - looks like this may be chronic - at least back to 04/2013. Push oral fluids (water) for the next week and repeat BMET 1 week. FU with PCP for elevated WBC. [name], PA-C   02/01/2015 1:34 PM

## 2015-02-03 ENCOUNTER — Telehealth: Payer: Self-pay | Admitting: *Deleted

## 2015-02-03 ENCOUNTER — Ambulatory Visit
Admission: RE | Admit: 2015-02-03 | Discharge: 2015-02-03 | Disposition: A | Discharge status: Home / Self Care | Payer: Medicare Other | Source: Ambulatory Visit | Attending: Physician Assistant | Admitting: Physician Assistant

## 2015-02-03 ENCOUNTER — Other Ambulatory Visit: Payer: Self-pay | Admitting: *Deleted

## 2015-02-03 DIAGNOSIS — I739 Peripheral vascular disease, unspecified: Secondary | ICD-10-CM

## 2015-02-03 DIAGNOSIS — R9389 Abnormal findings on diagnostic imaging of other specified body structures: Secondary | ICD-10-CM

## 2015-02-03 DIAGNOSIS — I4892 Unspecified atrial flutter: Secondary | ICD-10-CM

## 2015-02-03 DIAGNOSIS — I480 Paroxysmal atrial fibrillation: Secondary | ICD-10-CM

## 2015-02-03 DIAGNOSIS — I350 Nonrheumatic aortic (valve) stenosis: Secondary | ICD-10-CM

## 2015-02-03 DIAGNOSIS — E782 Mixed hyperlipidemia: Secondary | ICD-10-CM

## 2015-02-03 DIAGNOSIS — I10 Essential (primary) hypertension: Secondary | ICD-10-CM

## 2015-02-03 DIAGNOSIS — I2510 Atherosclerotic heart disease of native coronary artery without angina pectoris: Secondary | ICD-10-CM

## 2015-02-03 NOTE — Telephone Encounter (Signed)
pt was notified of cxr results.  Notified that [name], PA-C ordered CT Chest w/o contrast.  Pt understands that someone from [facility] will call with that appt info.  Pt verbalized understanding.Reola Mosher 02-03-15

## 2015-02-06 ENCOUNTER — Ambulatory Visit (INDEPENDENT_AMBULATORY_CARE_PROVIDER_SITE_OTHER)
Admission: RE | Admit: 2015-02-06 | Discharge: 2015-02-06 | Disposition: A | Discharge status: Home / Self Care | Payer: Medicare Other | Source: Ambulatory Visit | Attending: Physician Assistant | Admitting: Physician Assistant

## 2015-02-06 DIAGNOSIS — R938 Abnormal findings on diagnostic imaging of other specified body structures: Secondary | ICD-10-CM

## 2015-02-06 DIAGNOSIS — R9389 Abnormal findings on diagnostic imaging of other specified body structures: Secondary | ICD-10-CM

## 2015-02-07 ENCOUNTER — Telehealth (HOSPITAL_COMMUNITY): Payer: Self-pay

## 2015-02-07 NOTE — Telephone Encounter (Signed)
Patient given detailed instructions per Myocardial Perfusion Study Information Sheet for test on 02-10-2015 at 0715. Patient Notified to arrive 15 minutes early, and that it is imperative to arrive on time for appointment to keep from having the test rescheduled. Patient verbalized understanding. Oletta Lamas, Nyomi Howser A

## 2015-02-08 ENCOUNTER — Telehealth: Payer: Self-pay | Admitting: *Deleted

## 2015-02-08 DIAGNOSIS — R0602 Shortness of breath: Secondary | ICD-10-CM

## 2015-02-08 DIAGNOSIS — R9389 Abnormal findings on diagnostic imaging of other specified body structures: Secondary | ICD-10-CM

## 2015-02-08 NOTE — Telephone Encounter (Signed)
Brynda Rim. PA s/w pt about chest ct results by phone today. Pt aware referral to Pulmonology, will place order today.

## 2015-02-10 ENCOUNTER — Other Ambulatory Visit (INDEPENDENT_AMBULATORY_CARE_PROVIDER_SITE_OTHER): Payer: Medicare Other | Admitting: *Deleted

## 2015-02-10 ENCOUNTER — Ambulatory Visit (HOSPITAL_COMMUNITY): Payer: Medicare Other | Attending: Cardiovascular Disease

## 2015-02-10 ENCOUNTER — Encounter (HOSPITAL_COMMUNITY): Payer: Medicare Other

## 2015-02-10 DIAGNOSIS — R0602 Shortness of breath: Secondary | ICD-10-CM

## 2015-02-10 DIAGNOSIS — R9439 Abnormal result of other cardiovascular function study: Secondary | ICD-10-CM | POA: Insufficient documentation

## 2015-02-10 DIAGNOSIS — I480 Paroxysmal atrial fibrillation: Secondary | ICD-10-CM | POA: Diagnosis not present

## 2015-02-10 DIAGNOSIS — I10 Essential (primary) hypertension: Secondary | ICD-10-CM | POA: Diagnosis not present

## 2015-02-10 DIAGNOSIS — I2510 Atherosclerotic heart disease of native coronary artery without angina pectoris: Secondary | ICD-10-CM

## 2015-02-10 DIAGNOSIS — I350 Nonrheumatic aortic (valve) stenosis: Secondary | ICD-10-CM

## 2015-02-10 DIAGNOSIS — I4892 Unspecified atrial flutter: Secondary | ICD-10-CM | POA: Diagnosis not present

## 2015-02-10 DIAGNOSIS — I739 Peripheral vascular disease, unspecified: Secondary | ICD-10-CM | POA: Diagnosis not present

## 2015-02-10 DIAGNOSIS — E782 Mixed hyperlipidemia: Secondary | ICD-10-CM

## 2015-02-10 LAB — MYOCARDIAL PERFUSION IMAGING
CHL CUP NUCLEAR SRS: 9
CHL CUP RESTING HR STRESS: 47 {beats}/min
LHR: 0.42
LVDIAVOL: 171 mL
LVSYSVOL: 91 mL
NUC STRESS TID: 1.07
Peak HR: 70 {beats}/min
SDS: 1
SSS: 10

## 2015-02-10 LAB — BASIC METABOLIC PANEL
BUN: 26 mg/dL — ABNORMAL HIGH (ref 6–23)
CHLORIDE: 103 meq/L (ref 96–112)
CO2: 29 mEq/L (ref 19–32)
Calcium: 10.5 mg/dL (ref 8.4–10.5)
Creatinine, Ser: 1.27 mg/dL (ref 0.40–1.50)
GFR: 58.59 mL/min — AB (ref >60.00)
Glucose, Bld: 103 mg/dL — ABNORMAL HIGH (ref 70–99)
POTASSIUM: 4.5 meq/L (ref 3.5–5.1)
Sodium: 138 mEq/L (ref 135–145)

## 2015-02-10 MED ORDER — TECHNETIUM TC 99M SESTAMIBI GENERIC - CARDIOLITE
10.5000 | Freq: Once | INTRAVENOUS | Status: AC | PRN
  Administered 2015-02-10: 11 via INTRAVENOUS

## 2015-02-10 MED ORDER — REGADENOSON 0.4 MG/5ML IV SOLN
0.4000 mg | Freq: Once | INTRAVENOUS | Status: AC
  Administered 2015-02-10: 0.4 mg via INTRAVENOUS

## 2015-02-10 MED ORDER — TECHNETIUM TC 99M SESTAMIBI GENERIC - CARDIOLITE
30.0000 | Freq: Once | INTRAVENOUS | Status: AC | PRN
  Administered 2015-02-10: 30 via INTRAVENOUS

## 2015-02-11 NOTE — Progress Notes (Signed)
Cardiology Office Note   Date:  02/13/2015   ID:  Troy Miller, DOB 02-Oct-1940, MRN [id number]  PCP:  [name], PA-C  Cardiologist:  Dr. Shelva Majestic     Chief Complaint  Patient presents with  . Follow-up    shortness of breath  . Coronary Artery Disease     History of Present Illness: Troy Miller is a 74 y.o. male with a hx of CAD s/p CABG in 1994 and redo CABG in May 2005 (RIMA to the LAD, SVG-distal RCA by [name] - his previous LIMA graft was preserved and supplied the OM1 vessel), AFlutter s/p ablation by 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  Metoprolol has been DC'd in the past due to bradycardia.    Echo in 4/16 demonstrated EF 50-55%, inf HK, mild AS with mean gradient 14 mmHg.  FU abdominal aortic Doppler study April 29, 2014 demonstrated an infrarenal fusiform aneurysm with current measurements at 3.63.7 with mild amount of atherosclerosis visualized throughout. The right and left proximal common iliac arteries also demonstrated aneurysmal dilatation. The right common iliac artery measured 2.3. A 2.3 without evidence for stenosis. Left common iliac artery measured 3.2 x 3.0 with mild amount of thrombus and low flow velocity.  I saw him 7/26 with complaints of dyspnea. BNP, hemoglobin was normal. Chest x-ray was obtained and demonstrated some increased changes of the pulmonary interstitium. Follow-up CT was obtained and demonstrated changes consistent with COPD, scattered interstitial lung disease changes in the periphery of the mid to lower lungs as well as bilateral calcified pleural plaques (? Asbestos exposure), bilateral pulmonary nodules. Referral to pulmonology is pending. Stress test was obtained and was read as intermediate risk due to EF 47%. However, findings demonstrated apical inferior scar, no ischemia. This was fairly consistent with  his prior nuclear study in 2014. He returns for follow-up.  He denies any significant change in his shortness of breath. He tells me, in retrospect, he has been short of breath for years. However, over last several weeks it has gotten worse. He denies chest pain, syncope, orthopnea, PND. He has chronic LE edema without change.   Studies/Reports Reviewed Today:  Myoview 02/10/15  Nuclear stress EF: 47%.  There was no ST segment deviation noted during stress.  The left ventricular ejection fraction is mildly decreased (45-54%).  Findings consistent with prior myocardial infarction.  This is an intermediate risk study. There was a medium-sized, severe basal to apical inferior perfusion defect suggestive of prior infarction. No ischemia. EF 47% with inferior hypokinesis. Intermediate risk (primarily due to low EF).    Dg Chest 2 View  02/03/2015     IMPRESSION: Slight increase increase conspicuity of the pulmonary interstitium since the previous study. This may reflect mild interstitial edema of cardiac or noncardiac cause. Pneumonitis could produce similar findings. There is no alveolar pneumonia.     Ct Chest Wo Contrast  02/06/2015      IMPRESSION: Emphysematous and bronchitic changes consistent with COPD.  Scattered interstitial lung disease changes in the periphery of the mid to lower lungs.  In combination with BILATERAL calcified pleural plaques, this could represent asbestos exposure and asbestosis.  Extensive atherosclerotic disease changes.  BILATERAL pulmonary nodules as above, with 2 indeterminate nodules in the LEFT lung each measuring 5 mm diameter and of uncertain stability ; recommendation below.  Given risk factors for bronchogenic carcinoma, follow-up  chest CT at 6 - 12 months is recommended. This recommendation follows the consensus statement: Guidelines for Management of Small Pulmonary Nodules Detected on CT Scans: A Statement from the [place] as published in  Radiology 2005;237:395-400.     Echo 11/04/14 - Left ventricle: Inferobasal HK. EF 50% to 55%.  Wall motion was normal;   - Aortic valve: There was mild stenosis. Valve area (VTI): 1.49cm^2. Valve area (Vmax): 1.43 cm^2. Valve area (Vmean): 1.43cm^2.  Mean gradient 14 mmHg, peak gradient 32 mmHg  - Mitral valve: Calcified annulus. - Left atrium: The atrium was mildly dilated. - Atrial septum: No defect or patent foramen ovale was identified.  Abdominal US 04/2014 AAA 3.6 x 3.7 cm bilat CIA aneurysmal dilatation  Myoview 07/2012 Low Risk; inf scar, mild apical anterior ischemia, EF 49%   Past Medical History  Diagnosis Date  . CAD (coronary artery disease)   . 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 Atrial fibrillation     03/19/10 ablation-[name]  . Atrial flutter   . PVD (peripheral vascular disease)     remote right fem-tib bypass graft sugery  . AAA (abdominal aortic aneurysm)   . Iliac artery aneurysm   . Hypertension   . Hyperlipemia   . 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 History of stress test     Myoview 8/16:  EF 47%, inferior scar, no ischemia, Intermediate risk (low EF)    Past Surgical History  Procedure Laterality Date  . Coronary artery bypass graft  1994 & 2005  . Femoral-tibial bypass graft      Remote  . Back surgery  2003  . Cholecystectomy  08/13/02  . Hernia repair  06/03/07  . Vasectomy  01/28/08  . Cardioversion  08/16/08  . Cardiac electrophysiology study and ablation  03/19/10     Current Outpatient Prescriptions  Medication Sig Dispense Refill  . aspirin 81 MG tablet Take 81 mg by mouth daily.    . calcium carbonate (OS-CAL) 600 MG TABS Take 600 mg by mouth 2 (two) times daily with a meal.    . cholecalciferol (VITAMIN D) 1000 UNITS tablet Take 1,000 Units by mouth 2 (two) times daily.    . fish oil-omega-3 fatty acids 1000 MG capsule Takes 6 caps daily    . folic acid  (FOLVITE) 1 MG tablet Take 1 mg by mouth daily.    . niacin (NIASPAN) 500 MG CR tablet Take 500 mg by mouth once. Takes 4 tabs at bedtime    . rivaroxaban (XARELTO) 20 MG TABS tablet Take 1 tablet (20 mg total) by mouth daily with supper. 25 tablet 0  . simvastatin (ZOCOR) 20 MG tablet Take 1 tablet (20 mg total) by mouth every evening. 30 tablet 7  . valsartan-hydrochlorothiazide (DIOVAN-HCT) 80-12.5 MG per tablet Take 1 tablet by mouth daily. 30 tablet 7   No current facility-administered medications for this visit.    Allergies:   Fentanyl and related    Social History:  The patient  reports that he quit smoking about 27 years ago. His smoking use included Cigarettes. He does not have any smokeless tobacco history on file. He reports that he does not drink alcohol or use illicit drugs.   Family History:  The patient's family history includes Cancer in his mother; Heart failure in his father; Stroke in his sister.    ROS:   Please see the  history of present illness.   Review of Systems  All other systems reviewed and are negative.    PHYSICAL EXAM: VS:  BP 120/58 mmHg  Pulse 58  Ht 6' (1.829 m)  Wt 214 lb (97.07 kg)  BMI 29.02 kg/m2    Wt Readings from Last 3 Encounters:  02/13/15 214 lb (97.07 kg)  02/10/15 213 lb (96.616 kg)  01/31/15 213 lb 3.2 oz (96.707 kg)     GEN: Well nourished, well developed, in no acute distress HEENT: normal Neck: no JVD, no carotid bruits, no masses Cardiac:  Normal R1/V4, RRR; 1/6 systolic murmur RUSB,  no rubs or gallops, trace bilateral LE edema   Respiratory:  Decreased breath sounds bilaterally, no wheezing, rhonchi or rales. GI: soft, nontender, nondistended, + BS MS: no deformity or atrophy Skin: warm and dry  Neuro:  CNs II-XII intact, Strength and sensation are intact Psych: Normal affect   EKG:  EKG is in not ordered today.  It demonstrates:  N/a   Recent Labs: 05/20/2014: ALT 13 01/31/2015: Hemoglobin 16.1; Platelets  175.0; Pro B Natriuretic peptide (BNP) 79.0; TSH 3.04 02/10/2015: BUN 26*; Creatinine, Ser 1.27; Potassium 4.5; Sodium 138    Lipid Panel    Component Value Date/Time   CHOL 103 05/20/2014 0823   CHOL 118 05/05/2013 0839   TRIG 135 05/20/2014 0823   TRIG 202* 05/05/2013 0839   HDL 39* 05/20/2014 0823   HDL 38* 05/05/2013 0839   CHOLHDL 2.6 05/20/2014 0823   VLDL 27 05/20/2014 0823   LDLCALC 37 05/20/2014 0823   LDLCALC 40 05/05/2013 0839      ASSESSMENT AND PLAN:  Shortness of Breath:  Recent nuclear study with stable findings demonstrating inferior scar but no ischemia. Recent echo earlier this year demonstrated normal LV function with inferior wall motion abnormalities consistent with prior infarct. Chest x-ray and chest CT are concerning for a pulmonary process contributing to his symptoms. He likely has a combination of COPD and/or interstitial lung disease. He worked in [employer] for years. He suspects exposure to asbestos in the past. Referral to pulmonology is pending. No further cardiac workup is warranted at this time.   Lung Nodules:  Refer to Pulmonary for evaluation.    Coronary artery disease involving native coronary artery of native heart without angina pectoris:  As noted, recent Myoview demonstrated no ischemia.  Continue statin, angiotensin receptor blocker, ASA.  Atrial flutter, unspecified:  S/p RFCA.  PAF (paroxysmal atrial fibrillation):  Maintaining NSR.  Continue Xarelto.    PVD (peripheral vascular disease):  No claudication.  Mild aortic stenosis:  Mild gradients on Echo in 10/2014.    Essential hypertension:  BP controlled.   Mixed hyperlipidemia:  Continue statin.   Sleep Apnea:  He has not had follow-up on his CPAP machine in years. He does not have a home health agency following him. I will refer him to Dr. Radford Pax for follow-up on sleep apnea.    Medication Changes: Current medicines are reviewed at length with the patient today.  Concerns  regarding medicines are as outlined above.  The following changes have been made:   Discontinued Medications   No medications on file   Modified Medications   No medications on file   New Prescriptions   No medications on file    Labs/ tests ordered today include:   No orders of the defined types were placed in this encounter.     Disposition:   FU Dr. Shelva Majestic 3 mos.  Signed, Versie Starks, MHS 02/13/2015 10:45 AM    [facility] [place], [place], [place]  [address] Phone: [phone]; Fax: [phone]

## 2015-02-12 ENCOUNTER — Encounter: Payer: Self-pay | Admitting: Physician Assistant

## 2015-02-13 ENCOUNTER — Encounter: Payer: Self-pay | Admitting: Physician Assistant

## 2015-02-13 ENCOUNTER — Ambulatory Visit (INDEPENDENT_AMBULATORY_CARE_PROVIDER_SITE_OTHER): Payer: Medicare Other | Admitting: Physician Assistant

## 2015-02-13 VITALS — BP 120/58 | HR 58 | Ht 72.0 in | Wt 214.0 lb

## 2015-02-13 DIAGNOSIS — E782 Mixed hyperlipidemia: Secondary | ICD-10-CM

## 2015-02-13 DIAGNOSIS — R0602 Shortness of breath: Secondary | ICD-10-CM | POA: Diagnosis not present

## 2015-02-13 DIAGNOSIS — I4892 Unspecified atrial flutter: Secondary | ICD-10-CM | POA: Diagnosis not present

## 2015-02-13 DIAGNOSIS — I350 Nonrheumatic aortic (valve) stenosis: Secondary | ICD-10-CM

## 2015-02-13 DIAGNOSIS — I2510 Atherosclerotic heart disease of native coronary artery without angina pectoris: Secondary | ICD-10-CM

## 2015-02-13 DIAGNOSIS — G4733 Obstructive sleep apnea (adult) (pediatric): Secondary | ICD-10-CM

## 2015-02-13 DIAGNOSIS — I739 Peripheral vascular disease, unspecified: Secondary | ICD-10-CM

## 2015-02-13 DIAGNOSIS — R918 Other nonspecific abnormal finding of lung field: Secondary | ICD-10-CM

## 2015-02-13 DIAGNOSIS — I480 Paroxysmal atrial fibrillation: Secondary | ICD-10-CM

## 2015-02-13 NOTE — Patient Instructions (Signed)
Medication Instructions:  Same-No change  Labwork: None  Testing/Procedures: None  Follow-Up: Your physician recommends that you schedule a follow-up appointment in: 3 months with Dr Claiborne Billings and in 3 to 4 months with Dr Radford Pax for CPAP and supplies.   Any Other Special Instructions Will Be Listed Below (If Applicable). [name], PA-c has referred you to Pulmonary please discuss at check out

## 2015-02-16 ENCOUNTER — Ambulatory Visit: Payer: Medicare Other | Admitting: Physician Assistant

## 2015-02-28 ENCOUNTER — Encounter: Payer: Self-pay | Admitting: Internal Medicine

## 2015-03-03 ENCOUNTER — Institutional Professional Consult (permissible substitution): Payer: Medicare Other | Admitting: Internal Medicine

## 2015-03-10 ENCOUNTER — Encounter: Payer: Self-pay | Admitting: Internal Medicine

## 2015-03-10 ENCOUNTER — Ambulatory Visit (INDEPENDENT_AMBULATORY_CARE_PROVIDER_SITE_OTHER): Payer: Medicare Other | Admitting: Internal Medicine

## 2015-03-10 VITALS — BP 102/64 | HR 52 | Ht 72.0 in | Wt 215.0 lb

## 2015-03-10 DIAGNOSIS — J61 Pneumoconiosis due to asbestos and other mineral fibers: Secondary | ICD-10-CM | POA: Insufficient documentation

## 2015-03-10 DIAGNOSIS — R0600 Dyspnea, unspecified: Secondary | ICD-10-CM | POA: Diagnosis not present

## 2015-03-10 DIAGNOSIS — R918 Other nonspecific abnormal finding of lung field: Secondary | ICD-10-CM | POA: Diagnosis not present

## 2015-03-10 DIAGNOSIS — Z23 Encounter for immunization: Secondary | ICD-10-CM

## 2015-03-10 DIAGNOSIS — R911 Solitary pulmonary nodule: Secondary | ICD-10-CM | POA: Insufficient documentation

## 2015-03-10 DIAGNOSIS — I2510 Atherosclerotic heart disease of native coronary artery without angina pectoris: Secondary | ICD-10-CM | POA: Diagnosis not present

## 2015-03-10 NOTE — Progress Notes (Addendum)
Subjective:    Patient ID: Troy Miller, male    DOB: Oct 20, 1940,  MRN: [id number]  HPI  35 yowm quit smoking 1987 no problem breathing but am cough/ resolved, did fine with 2  cabg's last one 2005 with new onset of doe x summer 2015 with neg cardiac eval so referred to pulmonary clinic 03/10/2015 by [name] with ? ILD/ Pos asbestos exp remotely (worked in [employer]) .    03/10/2015 1st [facility] office visit/ Wert   Chief Complaint  Patient presents with  . Pulmonary Consult    Referred by Dr. [name]. Pt c/o SOB for the past 2 months. He gets winded walking up a slight incline and "not far" on a flat surface.   slowed by hips and doe if walks fast past or up inclines = MMRC 1 assoc with minimal chronic am cough no change x years  Onset doe was insidious/ pattern is minimally progressive    No obvious day to day or daytime variabilty or assoc chronic cough or cp or chest tightness, subjective wheeze overt sinus or hb symptoms. No unusual exp hx or h/o childhood pna/ asthma or knowledge of premature birth.  Sleeping ok without nocturnal  or early am exacerbation  of respiratory  c/o's or need for noct saba. Also denies any obvious fluctuation of symptoms with weather or environmental changes or other aggravating or alleviating factors except as outlined above   Current Medications, Allergies, Complete Past Medical History, Past Surgical History, Family History, and Social History were reviewed in [employer] record.             Review of Systems  Constitutional: Negative for fever, chills, activity change, appetite change and unexpected weight change.  HENT: Negative for congestion, dental problem, postnasal drip, rhinorrhea, sneezing, sore throat, trouble swallowing and voice change.   Eyes: Negative for visual disturbance.  Respiratory: Positive for shortness of breath. Negative for cough and choking.   Cardiovascular: Negative for chest  pain and leg swelling.  Gastrointestinal: Negative for nausea, vomiting and abdominal pain.  Genitourinary: Negative for difficulty urinating.  Musculoskeletal: Positive for arthralgias.  Skin: Negative for rash.  Psychiatric/Behavioral: Negative for behavioral problems and confusion.       Objective:   Physical Exam  amb pleasant wm   Wt Readings from Last 3 Encounters:  03/10/15 215 lb (97.523 kg)  02/13/15 214 lb (97.07 kg)  02/10/15 213 lb (96.616 kg)    Vital signs reviewed   HEENT: nl dentition, turbinates, and orophanx. Nl external ear canals without cough reflex   NECK :  without JVD/Nodes/TM/ nl carotid upstrokes bilaterally   LUNGS: no acc muscle use,  Subtle bilateral bronchovesicular changes both bases min insp crackles s cough    CV:  RRR  no s3   II/VI sem s  increase in P2, no edema   ABD:  soft and nontender with nl excursion in the supine position. No bruits or organomegaly, bowel sounds nl  MS:  warm without deformities, calf tenderness, cyanosis or clubbing  SKIN: warm and dry without lesions    NEURO:  alert, approp, no deficits    I personally reviewed images and agree with radiology impression as follows:  CT s contrast  02/06/15 Emphysematous and bronchitic changes consistent with COPD. Scattered interstitial lung disease changes in the periphery of the mid to lower lungs. In combination with BILATERAL calcified pleural plaques, this could represent asbestos exposure and asbestosis. BILATERAL pulmonary nodules as  above, with 2 indeterminate nodules in the LEFT lung each measuring 5 mm diameter and of uncertain stability ; recommendation below.           Assessment & Plan:

## 2015-03-10 NOTE — Assessment & Plan Note (Signed)
See CT chest 02/06/15 > in computer for recall HRCT 08/09/15     CT results reviewed with pt >>> Too small for PET or bx, not suspicious enough for excisional bx > really only option for now is follow the Fleischner society guidelines as rec by radiology.  Discussed in detail all the  indications, usual  risks and alternatives  relative to the benefits with patient who agrees to proceed with conservative f/u as outlined    Total time = 80mreview case with pt/ discussion/ counseling/ giving and going over instructions (see avs)

## 2015-03-10 NOTE — Assessment & Plan Note (Signed)
-  CT chet 02/06/15 c/w ILD/asbestos exp  - .03/10/2015  Walked RA x 3 laps @ 185 ft each stopped due to sob with desat at brisk pace with sat 88% at end    DDx for pulmonary fibrosis  includes idiopathic pulmonary fibrosis, pulmonary fibrosis associated asbestos exp, PF assoc  with rheumatologic diseases (which have a relatively benign course in most cases) , adverse effect from  drugs such as chemotherapy or amiodarone exposure, nonspecific interstitial pneumonia which is typically steroid responsive, and chronic hypersensitivity pneumonitis.   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

## 2015-03-10 NOTE — Patient Instructions (Addendum)
Please schedule a follow up office visit in 4-6 weeks, call sooner if needed with pfts

## 2015-04-14 ENCOUNTER — Encounter: Payer: Self-pay | Admitting: Internal Medicine

## 2015-04-14 ENCOUNTER — Ambulatory Visit (INDEPENDENT_AMBULATORY_CARE_PROVIDER_SITE_OTHER): Payer: Medicare Other | Admitting: Internal Medicine

## 2015-04-14 VITALS — BP 104/58 | HR 50 | Ht 71.0 in | Wt 214.0 lb

## 2015-04-14 DIAGNOSIS — J61 Pneumoconiosis due to asbestos and other mineral fibers: Secondary | ICD-10-CM

## 2015-04-14 DIAGNOSIS — R0600 Dyspnea, unspecified: Secondary | ICD-10-CM

## 2015-04-14 DIAGNOSIS — R918 Other nonspecific abnormal finding of lung field: Secondary | ICD-10-CM

## 2015-04-14 DIAGNOSIS — I2510 Atherosclerotic heart disease of native coronary artery without angina pectoris: Secondary | ICD-10-CM

## 2015-04-14 LAB — PULMONARY FUNCTION TEST
DL/VA % PRED: 56 %
DL/VA: 2.63 ml/min/mmHg/L
DLCO unc % pred: 40 %
DLCO unc: 13.79 ml/min/mmHg
FEF 25-75 POST: 2.01 L/s
FEF 25-75 Pre: 1.97 L/sec
FEF2575-%Change-Post: 2 %
FEF2575-%PRED-POST: 89 %
FEF2575-%Pred-Pre: 87 %
FEV1-%CHANGE-POST: 1 %
FEV1-%PRED-PRE: 90 %
FEV1-%Pred-Post: 92 %
FEV1-POST: 2.91 L
FEV1-PRE: 2.86 L
FEV1FVC-%Change-Post: 0 %
FEV1FVC-%PRED-PRE: 100 %
FEV6-%Change-Post: 1 %
FEV6-%PRED-PRE: 96 %
FEV6-%Pred-Post: 98 %
FEV6-POST: 4.03 L
FEV6-Pre: 3.96 L
FEV6FVC-%PRED-POST: 106 %
FEV6FVC-%Pred-Pre: 106 %
FVC-%CHANGE-POST: 1 %
FVC-%PRED-POST: 92 %
FVC-%PRED-PRE: 90 %
FVC-Post: 4.03 L
FVC-Pre: 3.96 L
POST FEV6/FVC RATIO: 100 %
PRE FEV1/FVC RATIO: 72 %
PRE FEV6/FVC RATIO: 100 %
Post FEV1/FVC ratio: 72 %
RV % PRED: 57 %
RV: 1.51 L
TLC % pred: 78 %
TLC: 5.72 L

## 2015-04-14 NOTE — Assessment & Plan Note (Addendum)
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  rec reg paced ex and f/u in one year unless worse symptoms   I had an extended discussion with the patient reviewing all relevant studies completed to date and  lasting 15 to 20 minutes of a 25 minute visit    Each maintenance medication was reviewed in detail including most importantly the difference between maintenance and prns and under what circumstances the prns are to be triggered using an action plan format that is not reflected in the computer generated alphabetically organized AVS.    Please see instructions for details which were reviewed in writing and the patient given a copy highlighting the part that I personally wrote and discussed at today's ov.

## 2015-04-14 NOTE — Patient Instructions (Addendum)
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

## 2015-04-14 NOTE — Progress Notes (Signed)
PFT done today.

## 2015-04-14 NOTE — Progress Notes (Signed)
Subjective:    Patient ID: Troy Miller, male    DOB: Oct 27, 1940   MRN: [id number]    Brief patient profile:  68 yowm quit smoking 1987 no problem breathing but am cough/ resolved, did fine with 2  cabg's last one 2005 with new onset of doe x summer 2015 with neg cardiac eval so referred to pulmonary clinic 03/10/2015 by Troy Miller with ? ILD/ Pos asbestos exp remotely (worked in [employer] in 1990's)   History of Present Illness  03/10/2015 1st [facility] office visit/ Writer Complaint  Patient presents with  . Pulmonary Consult    Referred by Dr. Kathlen Miller. Pt c/o SOB for the past 2 months. He gets winded walking up a slight incline and "not far" on a flat surface.   slowed by hips and doe if walks fast past or up inclines = MMRC 1 assoc with minimal chronic am cough no change x years  Onset doe was insidious/ pattern is minimally progressive   rec No change rx   04/14/2015 1st [facility] office visit/ Troy Miller   Chief Complaint  Patient presents with  . Follow-up    PFT done today. Pt states his breathing is unchanged. No new co's today.     Not limited by breathing from desired activities  - does fine as long as paces himself and no steep inclines = MMRC1 = can walk nl pace, flat grade, can't hurry or go uphills or steps s sob    No obvious day to day or daytime variability or assoc chronic cough or cp or chest tightness, subjective wheeze or overt sinus or hb symptoms. No unusual exp hx or h/o childhood pna/ asthma or knowledge of premature birth.  Sleeping ok without nocturnal  or early am exacerbation  of respiratory  c/o's or need for noct saba. Also denies any obvious fluctuation of symptoms with weather or environmental changes or other aggravating or alleviating factors except as outlined above   Current Medications, Allergies, Complete Past Medical History, Past Surgical History, Family History, and Social History were reviewed in [employer] record.  ROS  The following are not active complaints unless bolded sore throat, dysphagia, dental problems, itching, sneezing,  nasal congestion or excess/ purulent secretions, ear ache,   fever, chills, sweats, unintended wt loss, classically pleuritic or exertional cp, hemoptysis,  orthopnea pnd or leg swelling, presyncope, palpitations, abdominal pain, anorexia, nausea, vomiting, diarrhea  or change in bowel or bladder habits, change in stools or urine, dysuria,hematuria,  rash, arthralgias, visual complaints, headache, numbness, weakness or ataxia or problems with walking or coordination,  change in mood/affect or memory.                  Objective:   Physical Exam  amb pleasant wm   04/14/2015       214     03/10/15 215 lb (97.523 kg)  02/13/15 214 lb (97.07 kg)  02/10/15 213 lb (96.616 kg)    Vital signs reviewed   HEENT: nl dentition, turbinates, and orophanx. Nl external ear canals without cough reflex   NECK :  without JVD/Nodes/TM/ nl carotid upstrokes bilaterally   LUNGS: no acc muscle use,  Very Subtle bilateral bronchovesicular changes both bases min insp crackles s cough    CV:  RRR  no s3   II/VI sem s  increase in P2, no edema   ABD:  soft and nontender with nl excursion in the supine  position. No bruits or organomegaly, bowel sounds nl  MS:  warm without deformities, calf tenderness, cyanosis or clubbing  SKIN: warm and dry without lesions    NEURO:  alert, approp, no deficits    I personally reviewed images and agree with radiology impression as follows:  CT s contrast  02/06/15 Emphysematous and bronchitic changes consistent with COPD. Scattered interstitial lung disease changes in the periphery of the mid to lower lungs. In combination with BILATERAL calcified pleural plaques, this could represent asbestos exposure and asbestosis. BILATERAL pulmonary nodules as above, with 2 indeterminate nodules in the LEFT lung each  measuring 5 mm diameter and of uncertain stability ; recommendation below.           Assessment & Plan:

## 2015-04-14 NOTE — Assessment & Plan Note (Addendum)
See CT chest 02/06/15 > in computer for recall HRCT 08/09/15   Discussed in detail all the  indications, usual  risks and alternatives  relative to the benefits with patient who agrees to proceed with conservative f/u as outlined

## 2015-04-20 ENCOUNTER — Telehealth: Payer: Self-pay | Admitting: Internal Medicine

## 2015-04-20 NOTE — Telephone Encounter (Signed)
Attempted to call pt x 3. Received busy signal.

## 2015-04-21 NOTE — Telephone Encounter (Signed)
ATC home number x 3. Line is busy.  ATC work number, "Call could not be complete". WCB.

## 2015-04-25 ENCOUNTER — Telehealth: Payer: Self-pay | Admitting: Internal Medicine

## 2015-04-25 NOTE — Telephone Encounter (Signed)
Attempted to call pt back at both home and work #  Received error for both lines stating "call could not be completed as dialed."  Will attempt to call back later

## 2015-04-26 NOTE — Telephone Encounter (Signed)
ATC, call can not be completed as dialed, will try again later

## 2015-04-27 NOTE — Telephone Encounter (Signed)
atc X3, call can not be completed as dialed.   Will close per triage protocol.

## 2015-05-04 ENCOUNTER — Other Ambulatory Visit: Payer: Self-pay | Admitting: Cardiovascular Disease

## 2015-05-04 DIAGNOSIS — I714 Abdominal aortic aneurysm, without rupture, unspecified: Secondary | ICD-10-CM

## 2015-05-12 ENCOUNTER — Ambulatory Visit (HOSPITAL_COMMUNITY)
Admission: RE | Admit: 2015-05-12 | Discharge: 2015-05-12 | Disposition: A | Discharge status: Home / Self Care | Payer: Medicare Other | Source: Ambulatory Visit | Attending: Cardiovascular Disease | Admitting: Cardiovascular Disease

## 2015-05-12 DIAGNOSIS — I714 Abdominal aortic aneurysm, without rupture, unspecified: Secondary | ICD-10-CM

## 2015-05-12 DIAGNOSIS — E785 Hyperlipidemia, unspecified: Secondary | ICD-10-CM | POA: Diagnosis not present

## 2015-05-12 DIAGNOSIS — I723 Aneurysm of iliac artery: Secondary | ICD-10-CM | POA: Insufficient documentation

## 2015-05-12 DIAGNOSIS — I739 Peripheral vascular disease, unspecified: Secondary | ICD-10-CM | POA: Insufficient documentation

## 2015-05-17 ENCOUNTER — Encounter: Payer: Self-pay | Admitting: Cardiovascular Disease

## 2015-05-17 ENCOUNTER — Ambulatory Visit (INDEPENDENT_AMBULATORY_CARE_PROVIDER_SITE_OTHER): Payer: Medicare Other | Admitting: Cardiovascular Disease

## 2015-05-17 VITALS — BP 116/56 | HR 62 | Ht 72.0 in | Wt 213.1 lb

## 2015-05-17 DIAGNOSIS — I10 Essential (primary) hypertension: Secondary | ICD-10-CM | POA: Diagnosis not present

## 2015-05-17 DIAGNOSIS — I350 Nonrheumatic aortic (valve) stenosis: Secondary | ICD-10-CM

## 2015-05-17 DIAGNOSIS — G4733 Obstructive sleep apnea (adult) (pediatric): Secondary | ICD-10-CM

## 2015-05-17 DIAGNOSIS — Z7901 Long term (current) use of anticoagulants: Secondary | ICD-10-CM

## 2015-05-17 DIAGNOSIS — I483 Typical atrial flutter: Secondary | ICD-10-CM | POA: Diagnosis not present

## 2015-05-17 DIAGNOSIS — I25810 Atherosclerosis of coronary artery bypass graft(s) without angina pectoris: Secondary | ICD-10-CM

## 2015-05-17 DIAGNOSIS — E782 Mixed hyperlipidemia: Secondary | ICD-10-CM
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
## 2015-05-17 NOTE — Patient Instructions (Signed)
Your physician wants you to follow-up in: 6 months or sooner if needed. You will receive a reminder letter in the mail two months in advance. If you don't receive a letter, please call our office to schedule the follow-up appointment.   If you need a refill on your cardiac medications before your next appointment, please call your pharmacy.

## 2015-05-27 ENCOUNTER — Other Ambulatory Visit: Payer: Self-pay | Admitting: Cardiovascular Disease

## 2015-06-07 ENCOUNTER — Telehealth: Payer: Self-pay | Admitting: Cardiovascular Disease

## 2015-06-07 NOTE — Telephone Encounter (Signed)
Please call,pt was talking in a whispering voice. I do not know if he was sick or bad connection.

## 2015-06-07 NOTE — Telephone Encounter (Signed)
Returned call to patient Xarelto 20 mg samples left at front desk of [facility] office.

## 2015-06-16 ENCOUNTER — Ambulatory Visit: Payer: Medicare Other | Admitting: Cardiology

## 2015-07-06 ENCOUNTER — Telehealth: Payer: Self-pay | Admitting: Cardiovascular Disease

## 2015-07-06 NOTE — Telephone Encounter (Signed)
Medication Samples have been provided to the patient.  Drug name: Xarelto 20 mg Qty: # 30 six bottles  LOT: [id number]  Exp.Date: 07/2017  The patient has been instructed regarding the correct time, dose, and frequency of taking this medication, including desired effects and most common side effects.  Left at up front desk in bag with patient name and DOB Will pick up on Friday 12/30  [facility] 11:41 AM 07/06/2015

## 2015-07-06 NOTE — Telephone Encounter (Signed)
Patient calling the office for samples of medication:   1.  What medication and dosage are you requesting samples for? Xarelto  2.  Are you currently out of this medication?  1 pill left

## 2015-08-09 ENCOUNTER — Telehealth: Payer: Self-pay | Admitting: Cardiovascular Disease

## 2015-08-09 NOTE — Telephone Encounter (Signed)
Returned call to patient xarelto 20 mg samples left at [employer] office front desk.Message sent to Mariann Laster to discuss C Pap supplies.

## 2015-08-09 NOTE — Telephone Encounter (Signed)
Patient calling the office for samples of medication:   1.  What medication and dosage are you requesting samples for? Xarelto 51m 2.  Are you currently out of this medication? yes    Pt also need to speak to nurse concerning his c-pap machine supplies.

## 2015-09-08 ENCOUNTER — Telehealth: Payer: Self-pay | Admitting: Cardiovascular Disease

## 2015-09-08 NOTE — Telephone Encounter (Signed)
New message     Patient calling the office for samples of medication:   1.  What medication and dosage are you requesting samples for?  xarelto 21m  2.  Are you currently out of this medication?  yes

## 2015-09-08 NOTE — Telephone Encounter (Signed)
Medication Samples have been provided to the patient.  Drug name: Xarelto       Strength: 20 mg        Qty: # 21  LOT: [id number]  Exp.Date: 08/19  The patient has been instructed regarding the correct time, dose, and frequency of taking this medication, including desired effects and most common side effects.   Kathy Breach 9:47 AM 09/08/2015

## 2015-10-02 ENCOUNTER — Telehealth: Payer: Self-pay | Admitting: *Deleted

## 2015-10-02 ENCOUNTER — Telehealth: Payer: Self-pay | Admitting: Cardiovascular Disease

## 2015-10-02 NOTE — Telephone Encounter (Signed)
Patient calling the office for samples of medication: ° ° °1.  What medication and dosage are you requesting samples for? Xarelto  ° °2.  Are you currently out of this medication? yes ° ° °

## 2015-10-02 NOTE — Telephone Encounter (Signed)
wants xarelto samples. we do not have at [address], will send to Dr. Evette Georges RN to see if [facility] has any.

## 2015-10-03 MED ORDER — RIVAROXABAN 20 MG PO TABS: 20.0000 mg | ORAL_TABLET | Freq: Every day | ORAL | Status: DC

## 2015-10-03 NOTE — Telephone Encounter (Signed)
Samples provided, left msg for pt that they will be available at front desk for pickup.

## 2015-10-18 ENCOUNTER — Telehealth: Payer: Self-pay | Admitting: *Deleted

## 2015-10-18 NOTE — Telephone Encounter (Signed)
Called patient to see if ever received his CPAP supplies. He has not. Appointment made for him to see Dr Claiborne Billings on 4/17 as his FTF and 6 month ROV

## 2015-10-23 ENCOUNTER — Ambulatory Visit (INDEPENDENT_AMBULATORY_CARE_PROVIDER_SITE_OTHER): Payer: Medicare Other | Admitting: Cardiovascular Disease

## 2015-10-23 ENCOUNTER — Encounter: Payer: Self-pay | Admitting: Cardiovascular Disease

## 2015-10-23 VITALS — BP 120/60 | HR 62 | Ht 72.0 in | Wt 208.0 lb

## 2015-10-23 DIAGNOSIS — I4892 Unspecified atrial flutter: Secondary | ICD-10-CM

## 2015-10-23 DIAGNOSIS — R5383 Other fatigue: Secondary | ICD-10-CM

## 2015-10-23 DIAGNOSIS — I2510 Atherosclerotic heart disease of native coronary artery without angina pectoris: Secondary | ICD-10-CM

## 2015-10-23 DIAGNOSIS — E782 Mixed hyperlipidemia: Secondary | ICD-10-CM

## 2015-10-23 DIAGNOSIS — G4733 Obstructive sleep apnea (adult) (pediatric): Secondary | ICD-10-CM | POA: Diagnosis not present

## 2015-10-23 DIAGNOSIS — I10 Essential (primary) hypertension: Secondary | ICD-10-CM

## 2015-10-23 DIAGNOSIS — Z7901 Long term (current) use of anticoagulants: Secondary | ICD-10-CM

## 2015-10-23 NOTE — Patient Instructions (Addendum)
Your physician recommends that you return for lab work in: [place].   No changes in your medications were made today.  Your physician recommends that you schedule a follow-up appointment in: August sleep clinic.  Your physician has recommended that you have a sleep study. This test records several body functions during sleep, including: brain activity, eye movement, oxygen and carbon dioxide blood levels, heart rate and rhythm, breathing rate and rhythm, the flow of air through your mouth and nose, snoring, body muscle movements, and chest and belly movement.

## 2015-10-26 ENCOUNTER — Encounter: Payer: Self-pay | Admitting: Cardiovascular Disease
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
## 2015-12-05 ENCOUNTER — Other Ambulatory Visit: Payer: Self-pay | Admitting: Cardiovascular Disease

## 2015-12-05 MED ORDER — RIVAROXABAN 20 MG PO TABS: 20.0000 mg | ORAL_TABLET | Freq: Every morning | ORAL | Status: DC

## 2015-12-05 NOTE — Telephone Encounter (Signed)
Patient calling the office for samples of medication:   1.  What medication and dosage are you requesting samples for?Xareltp  2.  Are you currently out of this medication?  1 pill left

## 2015-12-05 NOTE — Telephone Encounter (Signed)
Called pt and informed him samples available at front desk. Pt voiced acknowledgment.

## 2015-12-22 ENCOUNTER — Ambulatory Visit (HOSPITAL_BASED_OUTPATIENT_CLINIC_OR_DEPARTMENT_OTHER): Payer: Medicare Other | Attending: Cardiovascular Disease | Admitting: Cardiovascular Disease

## 2015-12-22 DIAGNOSIS — I493 Ventricular premature depolarization: Secondary | ICD-10-CM | POA: Diagnosis not present

## 2015-12-22 DIAGNOSIS — R0683 Snoring: Secondary | ICD-10-CM | POA: Insufficient documentation

## 2015-12-22 DIAGNOSIS — Z7982 Long term (current) use of aspirin: Secondary | ICD-10-CM | POA: Diagnosis not present

## 2015-12-22 DIAGNOSIS — Z79899 Other long term (current) drug therapy: Secondary | ICD-10-CM | POA: Diagnosis not present

## 2015-12-22 DIAGNOSIS — Z7901 Long term (current) use of anticoagulants: Secondary | ICD-10-CM | POA: Insufficient documentation

## 2015-12-22 DIAGNOSIS — G4733 Obstructive sleep apnea (adult) (pediatric): Secondary | ICD-10-CM | POA: Insufficient documentation

## 2015-12-26 ENCOUNTER — Encounter (HOSPITAL_BASED_OUTPATIENT_CLINIC_OR_DEPARTMENT_OTHER): Payer: Self-pay | Admitting: Cardiovascular Disease

## 2015-12-26 NOTE — Procedures (Signed)
Patient Name: Troy Miller, Troy Miller Date: 12/22/2015 Gender: Male D.O.B: Dec 03, 1940 Age (years): 78 Referring Provider: Shelva Majestic MD, ABSM Height (inches): 72 Interpreting Physician: Shelva Majestic MD, ABSM Weight (lbs): 208 RPSGT: Gerhard Perches BMI: 28 MRN: [id number] Neck Size: 15.50  CLINICAL INFORMATION Sleep Study Type: Split Night CPAP Indication for sleep study: OSA Epworth Sleepiness Score: 5  SLEEP STUDY TECHNIQUE As per the AASM Manual for the Scoring of Sleep and Associated Events v2.3 (April 2016) with a hypopnea requiring 4% desaturations. The channels recorded and monitored were frontal, central and occipital EEG, electrooculogram (EOG), submentalis EMG (chin), nasal and oral airflow, thoracic and abdominal wall motion, anterior tibialis EMG, snore microphone, electrocardiogram, and pulse oximetry. Continuous positive airway pressure (CPAP) was initiated when the patient met split night criteria and was titrated according to treat sleep-disordered breathing.  MEDICATIONS  valsartan-hydrochlorothiazide (DIOVAN-HCT) 80-12.5 MG tablet      simvastatin (ZOCOR) 20 MG tablet      rivaroxaban (XARELTO) 20 MG TABS tablet 20 mg, Every morning - 10a     niacin (NIASPAN) 500 MG CR tablet 631 mg, Once     folic acid (FOLVITE) 1 MG tablet 1 mg, Daily     fish oil-omega-3 fatty acids 1000 MG capsule      cholecalciferol (VITAMIN D) 1000 UNITS tablet 1,000 Units, 2 times daily     calcium carbonate (OS-CAL) 600 MG TABS 600 mg, 2 times daily with meals     aspirin 81 MG tablet 81 mg, Daily   Medications administered by patient during sleep study : No sleep medicine administered.  RESPIRATORY PARAMETERS Diagnostic Total AHI (/hr): 26.0 RDI (/hr): 26.0 OA Index (/hr): 0.7 CA Index (/hr): 0.7 REM AHI (/hr): 51.4 NREM AHI (/hr): 23.1 Supine AHI (/hr): 37.7 Non-supine AHI (/hr): 3.16 Min O2 Sat (%): 82.00 Mean O2 (%): 88.77 Time below 88%  (min): 76.4   Titration Optimal Pressure (cm): 15 AHI at Optimal Pressure (/hr): 0.0 Min O2 at Optimal Pressure (%): 89.0 Supine % at Optimal (%): 98 Sleep % at Optimal (%): 84    SLEEP ARCHITECTURE The recording time for the entire night was 415.2 minutes. During a baseline period of 172.4 minutes, the patient slept for 168.4 minutes in REM and nonREM, yielding a sleep efficiency of 97.7%. Sleep onset after lights out was 0.0 minutes with a REM latency of 95.7 minutes. The patient spent 0.89% of the night in stage N1 sleep, 87.23% in stage N2 sleep, 1.48% in stage N3 and 10.39% in REM. During the titration period of 233.4 minutes, the patient slept for 198.0 minutes in REM and nonREM, yielding a sleep efficiency of 84.8%. Sleep onset after CPAP initiation was 6.9 minutes with a REM latency of 63.5 minutes. The patient spent 3.54% of the night in stage N1 sleep, 72.22% in stage N2 sleep, 0.00% in stage N3 and 24.24% in REM.  CARDIAC DATA The 2 lead EKG demonstrated sinus rhythm. The mean heart rate was 47.03 beats per minute. Other EKG findings include: PVCs.  LEG MOVEMENT DATA The total Periodic Limb Movements of Sleep (PLMS) were 333. The PLMS index was 54.26.  IMPRESSIONS - Moderate obstructive sleep apnea occurred overall during the diagnostic portion of the study (AHI = 26.0/hour); however, sleep apnea was severe during REM sleep with an AHI of 51.4. An optimal PAP pressure was selected for this patient ( 15 cm of water) - No significant central sleep apnea occurred during the diagnostic portion of the study (CAI =  0.7/hour). - Moderate oxygen desaturation to a nadir of 82.00%. Time beklow 88% was 76.4 minutes. - The patient snored with Moderate snoring volume during the diagnostic portion of the study. - EKG findings include PVCs. - Severe periodic limb movements of sleep occurred during the study.  DIAGNOSIS - Obstructive Sleep Apnea (327.23 [G47.33 ICD-10])  RECOMMENDATIONS -  Recommend an initial trial of CPAP therapy with EPR of 3 at 15 cm H2O with a Medium size Resmed Full Face Mask AirFit F20 mask and heated humidification. - Efforts should be made to optimize nasal and oropharyngeal patenccy. - Avoid alcohol, sedatives and other CNS depressants that may worsen sleep apnea and disrupt normal sleep architecture. - If patient is symptomatic after initiation of CPAP therapy consider a trial of requip or another agent for restless legs with a PLMS index of 54.26. - Sleep hygiene should be reviewed to assess factors that may improve sleep quality. - Weight management and regular exercise should be initiated or continued. - Return to Sleep Center for re-evaluation after 4 weeks of therapy  Troy Sine, MD, [place], American Board of Sleep Medicine  ELECTRONICALLY SIGNED ON:  12/26/2015, 5:12 PM [place] PH: [phone]   FX: [phone] [place]

## 2015-12-28 ENCOUNTER — Telehealth: Payer: Self-pay | Admitting: Cardiovascular Disease

## 2015-12-28 MED ORDER — RIVAROXABAN 20 MG PO TABS: 20.0000 mg | ORAL_TABLET | Freq: Every morning | ORAL | Status: DC

## 2015-12-28 NOTE — Telephone Encounter (Signed)
Called pt to notify him samples available at front desk. Pt voiced acknowledgment and thanks.

## 2015-12-28 NOTE — Telephone Encounter (Signed)
New Prob  Patient calling the office for samples of medication:   1.  What medication and dosage are you requesting samples for? Xarelto  2.  Are you currently out of this medication? Yes

## 2015-12-29 ENCOUNTER — Other Ambulatory Visit: Payer: Self-pay | Admitting: *Deleted

## 2015-12-29 DIAGNOSIS — G4733 Obstructive sleep apnea (adult) (pediatric): Secondary | ICD-10-CM

## 2016-01-02 ENCOUNTER — Telehealth: Payer: Self-pay | Admitting: Cardiovascular Disease

## 2016-01-02 NOTE — Telephone Encounter (Signed)
Please call,concerning picking up his sleeping equipment.

## 2016-01-18 ENCOUNTER — Other Ambulatory Visit: Payer: Self-pay | Admitting: *Deleted

## 2016-01-18 DIAGNOSIS — G4733 Obstructive sleep apnea (adult) (pediatric): Secondary | ICD-10-CM

## 2016-01-18 NOTE — Telephone Encounter (Signed)
Returned a call to patient informing him that a order for a new machine with supplies were sent to [facility]. Awaiting Dr Evette Georges signature. He states that he wants it to go to a MDE company near where he lives. Told patient to call me back with name and number and I will send a referral to them today.

## 2016-01-19 ENCOUNTER — Telehealth: Payer: Self-pay | Admitting: Cardiovascular Disease

## 2016-01-19 NOTE — Telephone Encounter (Signed)
Message routed to [facility] - patient is calling back w/preferred DME company

## 2016-01-19 NOTE — Telephone Encounter (Signed)
New message      Pt would like to get his CPAP machine from [facility] [phone]

## 2016-01-22 ENCOUNTER — Telehealth: Payer: Self-pay | Admitting: *Deleted

## 2016-01-22 NOTE — Telephone Encounter (Signed)
CPAP order faxed to [facility] at number provided by them.

## 2016-01-22 NOTE — Telephone Encounter (Signed)
Faxed CPAP order to [facility] per patient request.

## 2016-01-24 ENCOUNTER — Telehealth: Payer: Self-pay | Admitting: *Deleted

## 2016-01-24 NOTE — Telephone Encounter (Signed)
Called patient to follow up to see if he has heard from [facility] in reference to his CPAP machine and supplies. He states that he has not. I recommended that he give them a few more days. They may be waiting to hear from his insurance company. If he does not hear from them by next week he should call me back to inform.

## 2016-01-29 ENCOUNTER — Telehealth: Payer: Self-pay | Admitting: Cardiovascular Disease

## 2016-01-29 NOTE — Telephone Encounter (Signed)
Returned pt call-pt aware samples are available and will be at the front desk.  Pt states he heard back from [facility] regarding his CPAP machine this AM as well.    Medication Samples: Xarelto 24m Lot: [id number]: 11-19

## 2016-01-29 NOTE — Telephone Encounter (Signed)
New message       Patient calling the office for samples of medication:   1.  What medication and dosage are you requesting samples for? xareto 48m  2.  Are you currently out of this medication? Almost out  And pt want WMariann Lasterto know that he has not heard from [facility] regarding his CPAP machine.

## 2016-02-23 ENCOUNTER — Encounter: Payer: Self-pay | Admitting: Cardiovascular Disease

## 2016-02-29 ENCOUNTER — Telehealth: Payer: Self-pay | Admitting: Cardiovascular Disease

## 2016-02-29 NOTE — Telephone Encounter (Signed)
Medication samples have been provided to the patient.  Drug name: Xarelto 26m  Qty: 28  LOT: [id number] Exp.Date: 11/19  Samples left at front desk for patient pick-up. Patient notified.  Pt states he will pick up in the AM and bring a [place]    Delphina Schum A Rashunda Passon 3:09 PM 02/29/2016

## 2016-02-29 NOTE — Telephone Encounter (Signed)
New Message  Patient calling the office for samples of medication:   1.  What medication and dosage are you requesting samples for? Xarelto 57m  2.  Are you currently out of this medication? yes

## 2016-03-29 ENCOUNTER — Telehealth: Payer: Self-pay | Admitting: Cardiovascular Disease

## 2016-03-29 NOTE — Telephone Encounter (Signed)
Patient aware samples are at the front desk for pick up  

## 2016-03-29 NOTE — Telephone Encounter (Signed)
Patient calling the office for samples of medication:   1.  What medication and dosage are you requesting samples for? xarelto 72m  2.  Are you currently out of this medication? yes

## 2016-04-24 ENCOUNTER — Encounter: Payer: Self-pay | Admitting: Internal Medicine

## 2016-04-26 IMAGING — NM NM MISC PROCEDURE
3 series · 18 of 18 positions shown · non-contrast
Comparison: none

[Series 1: rest_(id)_sa · 6.4mm · 6.40mm/px · 6 of 64 frames shown]
[frame 6/64]
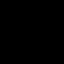
[frame 16/64]
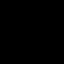
[frame 27/64]
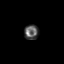
[frame 38/64]
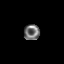
[frame 48/64]
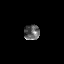
[frame 59/64]
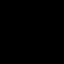

[Series 1: stress-sum-em_(id)_sa · 6.4mm · 6.40mm/px · 6 of 64 frames shown]
[frame 6/64]
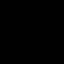
[frame 16/64]
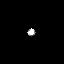
[frame 27/64]
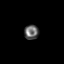
[frame 38/64]
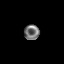
[frame 48/64]
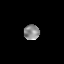
[frame 59/64]
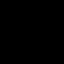

[Series 1: stress-gsp_(id)_sa · 6.4mm · 6.40mm/px · 6 of 512 frames shown]
[frame 43/512]
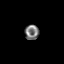
[frame 128/512]
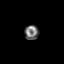
[frame 214/512]
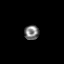
[frame 299/512]
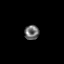
[frame 384/512]
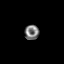
[frame 470/512]
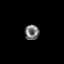

[18 of 18 positions shown; findings below may reference images not displayed]

Canned report from images found in remote index.

Refer to host system for actual result text.

## 2016-05-02 ENCOUNTER — Other Ambulatory Visit: Payer: Self-pay | Admitting: Internal Medicine

## 2016-05-02 ENCOUNTER — Telehealth: Payer: Self-pay | Admitting: Cardiovascular Disease

## 2016-05-02 DIAGNOSIS — J61 Pneumoconiosis due to asbestos and other mineral fibers: Secondary | ICD-10-CM

## 2016-05-02 NOTE — Telephone Encounter (Signed)
Patient aware samples are at the front desk for pick up  

## 2016-05-02 NOTE — Telephone Encounter (Signed)
New message   Patient calling the office for samples of medication:   1.  What medication and dosage are you requesting samples for?    xarelto 7m  2.  Are you currently out of this medication? no

## 2016-05-03 ENCOUNTER — Ambulatory Visit: Payer: Medicare Other | Admitting: Internal Medicine

## 2016-05-03 ENCOUNTER — Encounter (INDEPENDENT_AMBULATORY_CARE_PROVIDER_SITE_OTHER): Payer: Medicare Other | Admitting: Internal Medicine

## 2016-05-03 DIAGNOSIS — J61 Pneumoconiosis due to asbestos and other mineral fibers: Secondary | ICD-10-CM | POA: Diagnosis not present

## 2016-05-03 LAB — PULMONARY FUNCTION TEST
DL/VA % pred: 50 %
DL/VA: 2.33 ml/min/mmHg/L
DLCO COR % PRED: 36 %
DLCO COR: 12.13 ml/min/mmHg
DLCO unc % pred: 36 %
DLCO unc: 12.45 ml/min/mmHg
FEF 25-75 POST: 2.33 L/s
FEF 25-75 Pre: 2.17 L/sec
FEF2575-%CHANGE-POST: 7 %
FEF2575-%PRED-POST: 105 %
FEF2575-%Pred-Pre: 98 %
FEV1-%CHANGE-POST: 2 %
FEV1-%Pred-Post: 91 %
FEV1-%Pred-Pre: 89 %
FEV1-Post: 2.85 L
FEV1-Pre: 2.78 L
FEV1FVC-%CHANGE-POST: -3 %
FEV1FVC-%PRED-PRE: 104 %
FEV6-%Change-Post: 7 %
FEV6-%Pred-Post: 96 %
FEV6-%Pred-Pre: 89 %
FEV6-POST: 3.92 L
FEV6-Pre: 3.63 L
FEV6FVC-%Change-Post: 1 %
FEV6FVC-%PRED-POST: 106 %
FEV6FVC-%Pred-Pre: 104 %
FVC-%CHANGE-POST: 5 %
FVC-%PRED-POST: 90 %
FVC-%PRED-PRE: 85 %
FVC-POST: 3.92 L
FVC-PRE: 3.7 L
POST FEV1/FVC RATIO: 73 %
Post FEV6/FVC ratio: 100 %
Pre FEV1/FVC ratio: 75 %
Pre FEV6/FVC Ratio: 99 %
RV % pred: 77 %
RV: 2.07 L
TLC % pred: 82 %
TLC: 5.96 L

## 2016-05-17 ENCOUNTER — Ambulatory Visit (INDEPENDENT_AMBULATORY_CARE_PROVIDER_SITE_OTHER): Payer: Medicare Other | Admitting: Internal Medicine

## 2016-05-17 ENCOUNTER — Other Ambulatory Visit (INDEPENDENT_AMBULATORY_CARE_PROVIDER_SITE_OTHER): Payer: Medicare Other

## 2016-05-17 ENCOUNTER — Ambulatory Visit (INDEPENDENT_AMBULATORY_CARE_PROVIDER_SITE_OTHER)
Admission: RE | Admit: 2016-05-17 | Discharge: 2016-05-17 | Disposition: A | Discharge status: Home / Self Care | Payer: Medicare Other | Source: Ambulatory Visit | Attending: Internal Medicine | Admitting: Internal Medicine

## 2016-05-17 ENCOUNTER — Encounter: Payer: Self-pay | Admitting: Internal Medicine

## 2016-05-17 VITALS — BP 120/70 | HR 60 | Ht 72.0 in | Wt 210.0 lb

## 2016-05-17 DIAGNOSIS — J61 Pneumoconiosis due to asbestos and other mineral fibers: Secondary | ICD-10-CM

## 2016-05-17 DIAGNOSIS — I2510 Atherosclerotic heart disease of native coronary artery without angina pectoris: Secondary | ICD-10-CM

## 2016-05-17 DIAGNOSIS — Z23 Encounter for immunization: Secondary | ICD-10-CM | POA: Diagnosis not present

## 2016-05-17 DIAGNOSIS — R0600 Dyspnea, unspecified: Secondary | ICD-10-CM

## 2016-05-17 LAB — CBC WITH DIFFERENTIAL/PLATELET
Basophils Absolute: 0 K/uL (ref 0.0–0.1)
Basophils Relative: 0.4 % (ref 0.0–3.0)
Eosinophils Absolute: 0.4 K/uL (ref 0.0–0.7)
Eosinophils Relative: 3.3 % (ref 0.0–5.0)
HCT: 47.6 % (ref 39.0–52.0)
Hemoglobin: 16 g/dL (ref 13.0–17.0)
Lymphocytes Relative: 21 % (ref 12.0–46.0)
Lymphs Abs: 2.6 K/uL (ref 0.7–4.0)
MCHC: 33.6 g/dL (ref 30.0–36.0)
MCV: 90.9 fl (ref 78.0–100.0)
Monocytes Absolute: 1.3 K/uL — ABNORMAL HIGH (ref 0.1–1.0)
Monocytes Relative: 10.2 % (ref 3.0–12.0)
Neutro Abs: 8.1 K/uL — ABNORMAL HIGH (ref 1.4–7.7)
Neutrophils Relative %: 65.1 % (ref 43.0–77.0)
Platelets: 217 K/uL (ref 150.0–400.0)
RBC: 5.24 Mil/uL (ref 4.22–5.81)
RDW: 14.8 % (ref 11.5–15.5)
WBC: 12.5 K/uL — ABNORMAL HIGH (ref 4.0–10.5)

## 2016-05-17 LAB — BASIC METABOLIC PANEL
BUN: 15 mg/dL (ref 6–23)
CO2: 25 mEq/L (ref 19–32)
Calcium: 10.2 mg/dL (ref 8.4–10.5)
Chloride: 105 mEq/L (ref 96–112)
Creatinine, Ser: 1.13 mg/dL (ref 0.40–1.50)
GFR: 66.82 mL/min (ref >60.00)
Glucose, Bld: 96 mg/dL (ref 70–99)
POTASSIUM: 4.8 meq/L (ref 3.5–5.1)
SODIUM: 139 meq/L (ref 135–145)

## 2016-05-17 LAB — TSH: TSH: 3.04 u[IU]/mL (ref 0.35–4.50)

## 2016-05-17 LAB — BRAIN NATRIURETIC PEPTIDE: PRO B NATRI PEPTIDE: 95 pg/mL (ref 0.0–100.0)

## 2016-05-17 NOTE — Progress Notes (Signed)
Subjective:    Patient ID: Troy Miller, male    DOB: 05/19/1941   MRN: [id number]    Brief patient profile:  89 yowm quit smoking 1987 no problem breathing but am cough/ resolved, did fine with 2  cabg's last one 2005 with new onset of doe x summer 2015 with neg cardiac eval so referred to pulmonary clinic 03/10/2015 by [name] with ? ILD/ Pos asbestos exp remotely (worked in [employer] in 1990's).   History of Present Illness  03/10/2015 1st [place] [facility] office visit/ [facility]   Chief Complaint  Patient presents with  . Pulmonary Consult    Referred by Dr. [name]. Pt c/o SOB for the past 2 months. He gets winded walking up a slight incline and "not far" on a flat surface.   slowed by hips and doe if walks fast past or up inclines = MMRC 1 assoc with minimal chronic am cough no change x years  Onset doe was insidious/ pattern is minimally progressive   rec No change rx   04/14/2015 1st [place] [facility] office visit/ Wert   Chief Complaint  Patient presents with  . Follow-up    PFT done today. Pt states his breathing is unchanged. No new co's today.    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 Follow-up    CXR done today. He states his breathing is gradually worsening. He also has noticed increased cough, occ prod with yellow sputum.    MMRC2 = can't walk a nl pace on a flat grade s sob but does fine slow and flat eg [employer] leaning on cart    No obvious day to day or daytime variability or assoc chronic cough or cp or chest tightness, subjective wheeze or overt sinus or hb symptoms. No unusual exp hx or h/o childhood pna/ asthma or  knowledge of premature birth.  Sleeping ok without nocturnal  or early am exacerbation  of respiratory  c/o's or need for noct saba. Also denies any obvious fluctuation of symptoms with weather or environmental changes or other aggravating or alleviating factors except as outlined above   Current Medications, Allergies, Complete Past Medical History, Past Surgical History, Family History, and Social History were reviewed in [employer] record.  ROS  The following are not active complaints unless bolded sore throat, dysphagia, dental problems, itching, sneezing,  nasal congestion or excess/ purulent secretions, ear ache,   fever, chills, sweats, unintended wt loss, classically pleuritic or exertional cp, hemoptysis,  orthopnea pnd or leg swelling, presyncope, palpitations, abdominal pain, anorexia, nausea, vomiting, diarrhea  or change in bowel or bladder habits, change in stools or urine, dysuria,hematuria,  rash, arthralgias, visual complaints, headache, numbness, weakness or ataxia or problems with walking or coordination,  change in mood/affect or memory.                  Objective:  Physical Exam  amb pleasant wm   04/14/2015       214 > 05/17/2016  210     03/10/15 215 lb (97.523 kg)  02/13/15 214  lb (97.07 kg)  02/10/15 213 lb (96.616 kg)    Vital signs reviewed  - Note on arrival 02 sats  94% on RA     HEENT: nl dentition, turbinates, and orophanx. Nl external ear canals without cough reflex   NECK :  without JVD/Nodes/TM/ nl carotid upstrokes bilaterally   LUNGS: no acc muscle use,     CV:  RRR  no s3   II-IIII/VI sem s  increase in P2, no edema   ABD:  soft and nontender with nl excursion in the supine position. No bruits or organomegaly, bowel sounds nl  MS:  warm without deformities, calf tenderness, cyanosis or clubbing  SKIN: warm and dry without lesions    NEURO:  alert, approp, no deficits     CXR PA and Lateral:   05/17/2016 :      I personally reviewed images and agree with radiology impression as follows:    1. Bibasilar interstitial prominence, suggesting chronic interstitial lung disease, like related to history of asbestos exposure. Overall, changes appear slightly progressed relative to 2016. 2. Stable cardiomegaly with sequela prior CABG.     Labs ordered/ reviewed:      Chemistry      Component Value Date/Time   NA 139 05/17/2016 1516   K 4.8 05/17/2016 1516   CL 105 05/17/2016 1516   CO2 25 05/17/2016 1516   BUN 15 05/17/2016 1516   CREATININE 1.13 05/17/2016 1516   CREATININE 1.21 05/20/2014 0823      Component Value Date/Time   CALCIUM 10.2 05/17/2016 1516   ALKPHOS 72 05/20/2014 0823   AST 21 05/20/2014 0823   ALT 13 05/20/2014 0823   BILITOT 0.8 05/20/2014 0823        Lab Results  Component Value Date   WBC 12.5 (H) 05/17/2016   HGB 16.0 05/17/2016   HCT 47.6 05/17/2016   MCV 90.9 05/17/2016   PLT 217.0 05/17/2016       EOS                       0.4      05/17/2016      Lab Results  Component Value Date   TSH 3.04 05/17/2016     Lab Results  Component Value Date   PROBNP 95.0 05/17/2016           Assessment & Plan:

## 2016-05-17 NOTE — Patient Instructions (Signed)
Please remember to go to the lab   department downstairs for your tests - we will call you with the results when they are available.  Please schedule a follow up visit in  6 months but call sooner if needed   And we can start you on portable 02 if needed

## 2016-05-18 ENCOUNTER — Encounter: Payer: Self-pay | Admitting: Internal Medicine

## 2016-05-18 NOTE — Assessment & Plan Note (Addendum)
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 Whether the underlying AS, though prreviously mild, might be contributing to symptoms so rec repeat echo now and reconsider amb 02 if nothing else to offer here  I had an extended discussion with the patient reviewing all relevant studies completed to date and  lasting 15 to 20 minutes of a 25 minute visit    Each maintenance medication was reviewed in detail including most importantly the difference between maintenance and prns and under what circumstances the prns are to be triggered using an action plan format that is not reflected in the computer generated alphabetically organized AVS.    Please see instructions for details which were reviewed in writing and the patient given a copy highlighting the part that I personally wrote and discussed at today's ov.

## 2016-05-20 ENCOUNTER — Telehealth: Payer: Self-pay | Admitting: *Deleted

## 2016-05-20 ENCOUNTER — Encounter: Payer: Self-pay | Admitting: *Deleted

## 2016-05-20 NOTE — Telephone Encounter (Signed)
Unable to call [phone] numbers from our office  I have mailed pt a letter to call and discuss recs

## 2016-05-20 NOTE — Progress Notes (Signed)
Letter mailed Unable to get through to pt by phone

## 2016-05-20 NOTE — Telephone Encounter (Signed)
-----  Message from Tanda Rockers, MD sent at 05/18/2016  9:16 AM EST ----- After review of records/ labs rec repeat ECHO before the end of the year dx Aortic stenosis

## 2016-06-03 ENCOUNTER — Telehealth: Payer: Self-pay | Admitting: Cardiovascular Disease

## 2016-06-03 NOTE — Telephone Encounter (Signed)
New Message  Patient calling the office for samples of medication:   1.  What medication and dosage are you requesting samples for? Xarelto 90m   2.  Are you currently out of this medication? Yes

## 2016-06-04 ENCOUNTER — Telehealth: Payer: Self-pay | Admitting: Internal Medicine

## 2016-06-04 NOTE — Telephone Encounter (Signed)
error

## 2016-06-04 NOTE — Telephone Encounter (Signed)
ATC-phone line is giving a busy dial. Will try back.

## 2016-06-04 NOTE — Telephone Encounter (Signed)
Samples at the front desk Pt notified 

## 2016-06-05 NOTE — Telephone Encounter (Signed)
Attempted to call pt. Received fast busy x2. Will try back.

## 2016-06-07 NOTE — Telephone Encounter (Signed)
His number is a phone number that we can't contact from our office.

## 2016-06-21 ENCOUNTER — Telehealth: Payer: Self-pay | Admitting: Cardiovascular Disease

## 2016-06-21 NOTE — Telephone Encounter (Signed)
Patient calling the office for samples of medication:   1.  What medication and dosage are you requesting samples for? Xarelto   2.  Are you currently out of this medication? yes

## 2016-06-21 NOTE — Telephone Encounter (Signed)
Medication Samples have been provided to the patient.  Drug name: Xarelto84mQty: 14LOT: [id number].Date: 09/2018  The patient has been instructed regarding the correct time, dose, and frequency of taking this medication, including desired effects and most common side effects.

## 2016-06-24 ENCOUNTER — Other Ambulatory Visit: Payer: Self-pay | Admitting: Cardiovascular Disease

## 2016-07-10 ENCOUNTER — Encounter: Payer: Self-pay | Admitting: *Deleted

## 2016-07-10 DIAGNOSIS — R918 Other nonspecific abnormal finding of lung field: Secondary | ICD-10-CM

## 2016-07-11 ENCOUNTER — Telehealth: Payer: Self-pay | Admitting: Cardiovascular Disease

## 2016-07-11 NOTE — Telephone Encounter (Signed)
Returned call to patient left message on personal voice mail Xarelto 20 mg samples left at [employer] office front desk.

## 2016-07-11 NOTE — Telephone Encounter (Signed)
New message      Patient calling the office for samples of medication:   1.  What medication and dosage are you requesting samples for? xarelto 70m 2.  Are you currently out of this medication? Almost out

## 2016-08-01 ENCOUNTER — Other Ambulatory Visit: Payer: Self-pay | Admitting: Cardiovascular Disease

## 2016-08-01 NOTE — Telephone Encounter (Signed)
Patient calling the office for samples of medication:   1.  What medication and dosage are you requesting samples for? Xarelto 2.  Are you currently out of this medication? Yes

## 2016-08-01 NOTE — Telephone Encounter (Signed)
Xarelto 20 mg #2 lot #[id number] exp 4-20 Left at front desk for patient to pick up, patient aware

## 2016-08-08 ENCOUNTER — Other Ambulatory Visit: Payer: Medicare Other

## 2016-08-09 ENCOUNTER — Ambulatory Visit (INDEPENDENT_AMBULATORY_CARE_PROVIDER_SITE_OTHER)
Admission: RE | Admit: 2016-08-09 | Discharge: 2016-08-09 | Disposition: A | Discharge status: Home / Self Care | Payer: Medicare Other | Source: Ambulatory Visit | Attending: Internal Medicine | Admitting: Internal Medicine

## 2016-08-09 ENCOUNTER — Encounter: Payer: Self-pay | Admitting: *Deleted

## 2016-08-09 DIAGNOSIS — R918 Other nonspecific abnormal finding of lung field: Secondary | ICD-10-CM | POA: Diagnosis not present

## 2016-08-09 NOTE — Progress Notes (Signed)
Letter mailed to the pt asking him to call for results We can not call out since he has 910 area code

## 2016-08-20 ENCOUNTER — Telehealth: Payer: Self-pay | Admitting: Cardiovascular Disease

## 2016-08-20 MED ORDER — RIVAROXABAN 20 MG PO TABS: 20.0000 mg | ORAL_TABLET | Freq: Every morning | ORAL | 1 refills | Status: DC

## 2016-08-20 NOTE — Telephone Encounter (Signed)
samples of Xarelto 86m

## 2016-08-20 NOTE — Telephone Encounter (Signed)
No samples available of Xarelto 51m

## 2016-08-20 NOTE — Telephone Encounter (Signed)
New message    Pt is calling asking if he can come get samples of Xarelto 66m?

## 2016-08-20 NOTE — Telephone Encounter (Signed)
Pt notified-will need rx sent to [facility]-rx sent

## 2016-08-21 ENCOUNTER — Telehealth: Payer: Self-pay | Admitting: Cardiovascular Disease

## 2016-08-21 NOTE — Telephone Encounter (Signed)
Returned call to patient Xarelto 20 mg samples left at [employer] office front desk for pick up.

## 2016-08-21 NOTE — Telephone Encounter (Signed)
Patient is calling to see if he could get xarelto samples. Thanks.

## 2016-09-06 ENCOUNTER — Ambulatory Visit (INDEPENDENT_AMBULATORY_CARE_PROVIDER_SITE_OTHER): Payer: Medicare Other | Admitting: Internal Medicine

## 2016-09-06 ENCOUNTER — Encounter: Payer: Self-pay | Admitting: Internal Medicine

## 2016-09-06 VITALS — BP 112/70 | HR 57 | Ht 72.0 in | Wt 211.0 lb

## 2016-09-06 DIAGNOSIS — J9611 Chronic respiratory failure with hypoxia: Secondary | ICD-10-CM | POA: Diagnosis not present

## 2016-09-06 DIAGNOSIS — J61 Pneumoconiosis due to asbestos and other mineral fibers: Secondary | ICD-10-CM

## 2016-09-06 MED ORDER — PANTOPRAZOLE SODIUM 40 MG PO TBEC: 40.0000 mg | DELAYED_RELEASE_TABLET | Freq: Every day | ORAL | 2 refills | Status: DC

## 2016-09-06 MED ORDER — FAMOTIDINE 20 MG PO TABS: ORAL_TABLET | ORAL | 2 refills | Status: DC

## 2016-09-06 NOTE — Patient Instructions (Addendum)
Please see patient coordinator before you leave today  to schedule evaluation for POC  Pantoprazole (protonix) 40 mg   Take  30-60 min before first meal of the day and Pepcid (famotidine)  20 mg one @  bedtime until return to office - this is the best way to tell whether stomach acid is contributing to your problem.    GERD (REFLUX)  is an extremely common cause of respiratory symptoms just like yours , many times with no obvious heartburn at all.    It can be treated with medication, but also with lifestyle changes including elevation of the head of your bed (ideally with 6 inch  bed blocks),  Smoking cessation, avoidance of late meals, excessive alcohol, and avoid fatty foods, chocolate, peppermint, colas, red wine, and acidic juices such as orange juice.  NO MINT OR MENTHOL PRODUCTS SO NO COUGH DROPS  USE SUGARLESS CANDY INSTEAD (Jolley ranchers or Stover's or Life Savers) or even ice chips will also do - the key is to swallow to prevent all throat clearing. NO OIL BASED VITAMINS - use powdered substitutes.    Please schedule a follow up visit in 3 months but call sooner if needed with PFTs

## 2016-09-06 NOTE — Progress Notes (Signed)
Subjective:    Patient ID: Troy Miller, male    DOB: [birth year]   MRN: [id number]    Brief patient profile:  29 yowm quit smoking 1987 no problem breathing but am cough/ resolved, did fine with 2  cabg's last one 2005 with new onset of doe x summer 2015 with neg cardiac eval so referred to pulmonary clinic 03/10/2015 by [name] with ? 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   History of Present Illness  03/10/2015 1st [place] [facility] office visit/ Troy Miller   Chief Complaint  Patient presents with  . Pulmonary Consult    Referred by Dr. [name]. Pt c/o SOB for the past 2 months. He gets winded walking up a slight incline and "not far" on a flat surface.   slowed by hips and doe if walks fast past or up inclines = MMRC 1 assoc with minimal chronic am cough no change x years  Onset doe was insidious/ pattern is minimally progressive   rec No change rx   04/14/2015 1st [place] [facility] office visit/ Troy Miller   Chief Complaint  Patient presents with  . Follow-up    PFT done today. Pt states his breathing is unchanged. No new co's today.    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 Follow-up    CXR done today. He states his breathing is gradually worsening. He also has noticed increased cough, occ prod with yellow sputum.    MMRC2 = can't walk a nl pace on a flat grade s sob but does fine slow and flat eg [employer] leaning on cart  rec Offered 02 / declined     09/06/2016  f/u ov/Troy Miller re: ILD/ prob asbestosis  Chief Complaint  Patient presents with  . Follow-up    Breathing is unchanged. Here to discuss last ct  chest results.   still mmrc =2 / not using 02 or monitoring it either/ no on gerd rx    No obvious day to day or daytime variability or assoc chronic cough or cp or chest tightness, subjective wheeze or overt sinus or hb symptoms. No unusual exp hx or h/o childhood pna/ asthma or knowledge of premature birth.  Sleeping ok without nocturnal  or early am exacerbation  of respiratory  c/o's or need for noct saba. Also denies any obvious fluctuation of symptoms with weather or environmental changes or other aggravating or alleviating factors except as outlined above   Current Medications, Allergies, Complete Past Medical History, Past Surgical History, Family History, and Social History were reviewed in [employer] record.  ROS  The following are not active complaints unless bolded sore throat, dysphagia, dental problems, itching, sneezing,  nasal congestion or excess/ purulent secretions, ear ache,   fever, chills, sweats, unintended wt loss, classically pleuritic or exertional cp, hemoptysis,  orthopnea pnd or leg swelling, presyncope, palpitations, abdominal pain, anorexia, nausea, vomiting, diarrhea  or change in bowel or bladder habits, change in stools or urine, dysuria,hematuria,  rash, arthralgias, visual complaints, headache, numbness, weakness or ataxia or problems with  walking or coordination,  change in mood/affect or memory.                  Objective:  Physical Exam  amb pleasant wm   04/14/2015       214 > 05/17/2016  210  > 09/06/2016 211     03/10/15 215 lb (97.523 kg)  02/13/15 214 lb (97.07 kg)  02/10/15 213 lb (96.616 kg)    Vital signs reviewed  - Note on arrival 02 sats  94% on RA     HEENT: nl dentition, turbinates, and orophanx. Nl external ear canals without cough reflex   NECK :  without JVD/Nodes/TM/ nl carotid upstrokes bilaterally   LUNGS: no acc muscle use - minimal insp crackles bilaterally s cough    CV:  RRR  no s3   II-IIII/VI  sem s  increase in P2, no edema   ABD:  soft and nontender with nl excursion in the supine position. No bruits or organomegaly, bowel sounds nl  MS:  warm without deformities, calf tenderness, cyanosis - no significant clubbing  SKIN: warm and dry without lesions    NEURO:  alert, approp, no deficits     CXR PA and Lateral:   05/17/2016 :    I personally reviewed images and agree with radiology impression as follows:    1. Bibasilar interstitial prominence, suggesting chronic interstitial lung disease, like related to history of asbestos exposure. Overall, changes appear slightly progressed relative to 2016. 2. Stable cardiomegaly with sequela prior CABG.     Labs ordered/ reviewed:      Chemistry      Component Value Date/Time   NA 139 05/17/2016 1516   K 4.8 05/17/2016 1516   CL 105 05/17/2016 1516   CO2 25 05/17/2016 1516   BUN 15 05/17/2016 1516   CREATININE 1.13 05/17/2016 1516   CREATININE 1.21 05/20/2014 0823      Component Value Date/Time   CALCIUM 10.2 05/17/2016 1516   ALKPHOS 72 05/20/2014 0823   AST 21 05/20/2014 0823   ALT 13 05/20/2014 0823   BILITOT 0.8 05/20/2014 0823        Lab Results  Component Value Date   WBC 12.5 (H) 05/17/2016   HGB 16.0 05/17/2016   HCT 47.6 05/17/2016   MCV 90.9 05/17/2016   PLT 217.0 05/17/2016       EOS                       0.4      05/17/2016      Lab Results  Component Value Date   TSH 3.04 05/17/2016     Lab Results  Component Value Date   PROBNP 95.0 05/17/2016           Assessment & Plan:

## 2016-09-08 NOTE — Assessment & Plan Note (Addendum)
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  - 03/10/2015  Walked RA x 3 laps @ 185 ft each stopped due to sob with desat at brisk pace with sat 92% at end - PFT's  04/14/2015  FEV1 2.91 (92 % ) ratio 72  p no % improvement from saba with DLCO  40 % corrects to 56 % for alv volume   - PFT's  05/03/16  2.78 (89%) DLCO 36/36c  corrects to 50% for alv vol  - 05/17/2016  Walked RA x 2 laps @ 185 ft each stopped due to sob and dropped to 87% nl pace with mild sob > offered 02 but declined  - 08/09/2016  Repeat CT Findings are compatible with  progressive interstitial lung disease and 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  In meantime, Use of PPI is associated with improved survival time and with decreased radiologic fibrosis per King's study published in [facility] vol 184 p1390.  Dec 2011 and also may have other beneficial effects as per the latest review in [facility] vol 193 M3559 Jun 20016.  This may not always be cause and effect, but given how universally unimpressive and expensive  all the other  Drugs developed to date have been for IPF,   rec start  rx ppi / diet/ lifestyle modification and f/u with serial walking sats and lung volumes for now to put more points on the curve / establish firm baseline before considering additional measures.   Discussed in detail all the  indications, usual  risks and alternatives  relative to the benefits with patient who agrees to proceed with conservative f/u as outlined    I had an extended discussion with the patient reviewing all relevant studies completed to date and  lasting 15 to 20 minutes of a 25  minute visit    Each maintenance medication was reviewed in detail including most importantly the difference between maintenance and prns and under what circumstances the prns are to be triggered using an action plan format that is not reflected in the computer generated alphabetically organized AVS.    Please see AVS for specific instructions unique to this visit that I personally wrote and verbalized to the the pt in detail and then reviewed with pt  by my nurse highlighting any  changes in therapy recommended at today's visit to their plan of care.

## 2016-09-09 ENCOUNTER — Telehealth: Payer: Self-pay | Admitting: Internal Medicine

## 2016-09-09 ENCOUNTER — Encounter: Payer: Self-pay | Admitting: *Deleted

## 2016-09-09 ENCOUNTER — Telehealth: Payer: Self-pay | Admitting: *Deleted

## 2016-09-09 DIAGNOSIS — J61 Pneumoconiosis due to asbestos and other mineral fibers: Secondary | ICD-10-CM

## 2016-09-09 DIAGNOSIS — R0600 Dyspnea, unspecified: Secondary | ICD-10-CM

## 2016-09-09 NOTE — Telephone Encounter (Signed)
lmtcb x1 Troy Miller at [facility].

## 2016-09-09 NOTE — Telephone Encounter (Signed)
Unable to reach pt with 910 area code  Will mail a letter

## 2016-09-09 NOTE — Telephone Encounter (Signed)
-----  Message from Tanda Rockers, MD sent at 09/09/2016  9:55 AM EST ----- Refer to [facility] for second opinion re esbriet vs OFEV use here  - next available/ MR aware

## 2016-09-10 NOTE — Telephone Encounter (Signed)
Patient was walked in office and qualified for O2, per Dr Melvyn Novas start on O2 @ 2 liters and patient needed to have a POC eval. The order was not placed for new O2 start, the DME only received the POC eval order. Tameka with [facility] called requesting clarification of the order sent as they do not have any record of the patient being on O2. Explained that the order was not placed but that I would place this now. Troy Miller is faxing over forms to be signed by Dr Melvyn Novas. Will hold in triage until these are received.

## 2016-09-10 NOTE — Telephone Encounter (Signed)
Form has been received and filled out. Will place in MW's look at to be signed.

## 2016-09-10 NOTE — Telephone Encounter (Signed)
signed

## 2016-09-10 NOTE — Telephone Encounter (Signed)
Form has been signed by MW and returned to triage. This has been faxed back to [place] at [facility] at [facility]. Nothing further was needed.

## 2016-09-11 ENCOUNTER — Telehealth: Payer: Self-pay | Admitting: Internal Medicine

## 2016-09-11 DIAGNOSIS — J9611 Chronic respiratory failure with hypoxia: Secondary | ICD-10-CM

## 2016-09-11 NOTE — Telephone Encounter (Signed)
OK- will forward to MW as Troy Miller  MW- I can not call this pt b/c he has 910 area code

## 2016-09-11 NOTE — Telephone Encounter (Signed)
Ok/documented

## 2016-09-16 ENCOUNTER — Telehealth: Payer: Self-pay | Admitting: Internal Medicine

## 2016-09-16 NOTE — Telephone Encounter (Signed)
ATC pt line busy WCB 

## 2016-09-17 NOTE — Telephone Encounter (Signed)
atc the pt but the ling rings busy.  Will try back.

## 2016-09-18 NOTE — Telephone Encounter (Signed)
ATC pt. Unable to contact pt with both numbers listed.

## 2016-09-19 ENCOUNTER — Telehealth: Payer: Self-pay | Admitting: Cardiovascular Disease

## 2016-09-19 NOTE — Telephone Encounter (Signed)
New Message   Patient calling the office for samples of medication:   1.  What medication and dosage are you requesting samples for? rivaroxaban (XARELTO) 20 MG TABS tablet  2.  Are you currently out of this medication? Yes

## 2016-09-19 NOTE — Telephone Encounter (Signed)
Attempted to contact pt. Unable to reach him on either number we have listed. Per triage protocol, message will be closed.

## 2016-09-19 NOTE — Telephone Encounter (Signed)
Spoke with pt, aware there are no samples available. He reports he will not be able to afford to get it refilled. He will go to the pharmacy and see if he can get part of a prescription until we get samples in the office. Patient voiced understanding of the importance of taking meds,

## 2016-09-20 ENCOUNTER — Telehealth: Payer: Self-pay | Admitting: Internal Medicine

## 2016-09-20 NOTE — Telephone Encounter (Signed)
Noted.

## 2016-09-23 ENCOUNTER — Telehealth: Payer: Self-pay | Admitting: Internal Medicine

## 2016-09-23 ENCOUNTER — Telehealth: Payer: Self-pay | Admitting: Cardiovascular Disease

## 2016-09-23 DIAGNOSIS — J61 Pneumoconiosis due to asbestos and other mineral fibers: Secondary | ICD-10-CM

## 2016-09-23 DIAGNOSIS — J9611 Chronic respiratory failure with hypoxia: Secondary | ICD-10-CM

## 2016-09-23 DIAGNOSIS — R0602 Shortness of breath: Secondary | ICD-10-CM

## 2016-09-23 NOTE — Telephone Encounter (Signed)
Patient called again  Pt stated that the portable oxygen system that was brought to him is a system that he is to roll behind him.  Pt stated this is unacceptable and would like something that he can carry on his shoulder.  Advised pt will send order to see if he can get smaller system that he can carry on his shoulder.  Pt okay with this  Order placed Nothing further needed; will sign off

## 2016-09-23 NOTE — Telephone Encounter (Signed)
New message  Patient calling the office for samples of medication:   1.  What medication and dosage are you requesting samples for? Xarelto 3m   2.  Are you currently out of this medication? yes

## 2016-09-24 NOTE — Telephone Encounter (Signed)
14 day sample available in clinic.  Will leave at front desk.  Samples of XARELTO were given to the patient, quantity 14 tabs,  Lot Number [id number]  Exp 09-2018   Patient able to afford the prescription?? Tried patient assistant program?   Apixaban and warfarin are alternatives for this patient.

## 2016-09-24 NOTE — Telephone Encounter (Signed)
No samples

## 2016-09-24 NOTE — Telephone Encounter (Signed)
No samples, can we change this medication to something else?

## 2016-09-25 NOTE — Telephone Encounter (Signed)
Pt notified of samples. Pt informed to apply for pt assistance and given:[facility], Call: [phone], Monday-Friday, 8 AM to 8 PM ET.

## 2016-10-01 NOTE — Telephone Encounter (Signed)
ok

## 2016-10-16 ENCOUNTER — Telehealth: Payer: Self-pay | Admitting: Cardiovascular Disease

## 2016-10-16 NOTE — Telephone Encounter (Signed)
New message   Patient calling the office for samples of medication:   1.  What medication and dosage are you requesting samples for? xarelto 20 mg  2.  Are you currently out of this medication? Yes, been out one day.

## 2016-10-17 NOTE — Telephone Encounter (Signed)
New message     Patient calling the office for samples of medication:   1.  What medication and dosage are you requesting samples for? xarelto 20   2.  Are you currently out of this medication?  yes

## 2016-10-22 ENCOUNTER — Other Ambulatory Visit: Payer: Self-pay | Admitting: Cardiovascular Disease

## 2016-10-22 NOTE — Telephone Encounter (Signed)
Rx(s) sent to pharmacy electronically.

## 2016-10-23 NOTE — Telephone Encounter (Signed)
Returned call to patient-apologized for delay?  Patient states he already picked up samples and does not need anymore at this time.

## 2016-11-05 ENCOUNTER — Ambulatory Visit: Payer: Medicare Other | Admitting: Internal Medicine

## 2016-11-07 ENCOUNTER — Telehealth: Payer: Self-pay | Admitting: Cardiovascular Disease

## 2016-11-07 NOTE — Telephone Encounter (Signed)
New message      Patient calling the office for samples of medication:   1.  What medication and dosage are you requesting samples for?  xarelto 4m  2.  Are you currently out of this medication? Almost out of medication

## 2016-11-07 NOTE — Telephone Encounter (Signed)
Lot #[id number] exp 3/20 Advised patient samples at front and confirmed f/u ov this month

## 2016-11-08 ENCOUNTER — Other Ambulatory Visit (INDEPENDENT_AMBULATORY_CARE_PROVIDER_SITE_OTHER): Payer: Medicare Other

## 2016-11-08 ENCOUNTER — Telehealth: Payer: Self-pay | Admitting: Internal Medicine

## 2016-11-08 ENCOUNTER — Ambulatory Visit (INDEPENDENT_AMBULATORY_CARE_PROVIDER_SITE_OTHER): Payer: Medicare Other | Admitting: Internal Medicine

## 2016-11-08 ENCOUNTER — Encounter: Payer: Self-pay | Admitting: Internal Medicine

## 2016-11-08 DIAGNOSIS — J84112 Idiopathic pulmonary fibrosis: Secondary | ICD-10-CM | POA: Diagnosis not present

## 2016-11-08 DIAGNOSIS — R911 Solitary pulmonary nodule: Secondary | ICD-10-CM

## 2016-11-08 LAB — HEPATIC FUNCTION PANEL
ALT: 10 U/L (ref 0–53)
AST: 17 U/L (ref 0–37)
Albumin: 4.8 g/dL (ref 3.5–5.2)
Alkaline Phosphatase: 60 U/L (ref 39–117)
BILIRUBIN DIRECT: 0.2 mg/dL (ref 0.0–0.3)
BILIRUBIN TOTAL: 0.8 mg/dL (ref 0.2–1.2)
TOTAL PROTEIN: 8 g/dL (ref 6.0–8.3)

## 2016-11-08 LAB — SEDIMENTATION RATE: Sed Rate: 20 mm/hr (ref 0–20)

## 2016-11-08 LAB — C-REACTIVE PROTEIN: CRP: 0.5 mg/dL (ref 0.5–20.0)

## 2016-11-08 NOTE — Progress Notes (Signed)
Subjective:     Patient ID: Troy Miller, male   DOB: 1941-06-15, 76 y.o.   MRN: [id number]  HPI   Brief patient profile:  75 yowm quit smoking 1987 no problem breathing but am cough/ resolved, did fine with 2  cabg's last one 2005 with new onset of doe x summer 2015 with neg cardiac eval so referred to Miller clinic 03/10/2015 by Troy Miller with ? 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   History of Present Illness  03/10/2015 1st Troy Miller office visit/ Miller   Chief Complaint  Patient presents with  . Miller Consult    Referred by Dr. Kathlen Miller. Pt c/o SOB for the past 2 months. He gets winded walking up a slight incline and "not far" on a flat surface.   slowed by hips and doe if walks fast past or up inclines = MMRC 1 assoc with minimal chronic am cough no change x years  Onset doe was insidious/ pattern is minimally progressive   rec No change rx   04/14/2015 1st [place] Miller office visit/ Miller   Chief Complaint  Patient presents with  . Follow-up    PFT done today. Pt states his breathing is unchanged. No new co's today.    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 Follow-up    CXR done today. He states his breathing is gradually worsening. He also has noticed increased cough, occ prod with yellow sputum.    MMRC2 = can't walk a nl pace on a flat grade s sob but does fine slow and flat eg [employer] leaning on cart  rec Offered 02 / declined     09/06/2016  f/u ov/Miller re: ILD/ prob asbestosis  Chief Complaint  Patient presents with  . Follow-up    Breathing is unchanged. Here to discuss  last ct chest results.   still mmrc =2 / not using 02 or monitoring it either/ no on gerd rx    No obvious day to day or daytime variability or assoc chronic cough or cp or chest tightness, subjective wheeze or overt sinus or hb symptoms. No unusual exp hx or h/o childhood pna/ asthma or knowledge of premature birth.  Sleeping ok without nocturnal  or early am exacerbation  of respiratory  c/o's or need for noct saba. Also denies any obvious fluctuation of symptoms with weather or environmental changes or other aggravating or alleviat   OV 11/08/2016  Chief Complaint  Patient presents with  . Follow-up    Pt here for a second opinion from Troy Miller. Pt states his breathing is unchanged since last OV - DOE. Pt c/o prod cough with clear to yellow mucus. Pt denies CP/tightness and f/c/s.    S: ACCP ILD Questionnaire  76 year old male referred for second opinion on Miller fibrosis/interstitial lung disease. He started seeing Troy Miller in late 2016 for interstitial lung disease. Visualization of the Miller function test shows mild progression. In fact he attest to me that he's had dyspnea for 4-5 years and is slowly and gradually progressive.  He says that he gets short of breath when hurrying on level ground or walking up a slight hill. And he has to stop for breath and he walks 100 yards on level ground. He also has a dry cough in the daytime for the same amount of time. It is mild and does wake him up in the middle of the night. This a dry cough.  Past medical history: He does endorse acid reflux, dry eyes, mild pedal edema, bruising, joint pain and a history of coronary artery disease status post 5 chest results are also. He denies any personal family history of lung disease including COPD or asthma Miller fibrosis or autoimmune disease personal history and family history.  Personal exposures: He started smoking at age 22 to packs a day and quit at age 5. He never did 3  drugs.  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 No sauna . no hot tub no Jacuzzi no burden no mold exposure.  Travel history is lived in [place] all his life except for serving in [place]   Pneumotox: Negative for any medications or radiation or chemotherapy for Miller issues   Results for Troy Miller (MRN [id number]) as of 11/08/2016 12:17  Ref. Range 04/14/2015 11:47 05/03/2016 12:52  FVC-Pre Latest Units: L 3.96 3.70  FVC-%PredLowe ICD and I was on call you after finish clinic-Pre Latest Units: % 90 85   Results for Troy Miller (MRN [id number]) as of 11/08/2016 12:17  Ref. Range 04/14/2015 11:47 05/03/2016 12:52  DLCO unc Latest Units: ml/min/mmHg 13.79 12.45  DLCO unc % pred Latest Units: % 40 36   Results for Troy Miller (MRN [id number]) as of 11/08/2016 12:17  Ref. Range 05/05/2013 08:39 11/05/2013 08:12 05/20/2014 08:23 01/31/2015 16:33 05/17/2016 15:16  Eosinophils Absolute Latest Ref Range: 0.0 - 0.7 K/uL    0.4 0.4   IMPRESSION: personally visualized 1. Findings are compatible with tiny progressive interstitial lung disease and are concerning for developing usual interstitial pneumonia (UIP). This is not yet definitive, and repeat evaluation with high-resolution chest CT is suggested in 6-12 months to assess for further temporal changes in the appearance of the lung parenchyma. 2. Some of the previously noted Miller nodules are stable, while others are new or have clearly enlarged. The largest of these new nodules in the right lower lobe is in the midst of an area of architectural distortion and fibrosis where there are several thick-walled small  cavitary areas, such that this could simply represent fluid filling some of the small cavities. However, there is also an 8 mm nodule in the left lower lobe which previously measured only 5 mm. Close attention on followup studies is suggested. PET-CT is not recommended at this time given the high likelihood for false-positive. 3. 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 4. Aortic atherosclerosis, in addition to left main and 3 vessel coronary artery disease. Status post median sternotomy for CABG including LIMA to the LAD. 5. There are calcifications of the aortic valve and mitral annulus. Echocardiographic correlation for evaluation of potential valvular dysfunction may be warranted if clinically indicated. 6. Mild cardiomegaly.   Electronically Signed   By: Mauri Brooklyn.D.  On: 08/09/2016 11:10   Nuclear Medicine stress test 02/10/15: There was a medium-sized, severe basal to apical inferior perfusion defect suggestive of prior infarction.  No ischemia.  EF 47% with inferior hypokinesis.  Intermediate risk (primarily due to low EF).    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   reports that he quit smoking about 30 years ago. His smoking use included Cigarettes. He has a 50.00 pack-year smoking history. He has never used smokeless tobacco.  Past Surgical History:  Procedure Laterality Date  . BACK SURGERY  2003  . CARDIAC ELECTROPHYSIOLOGY STUDY AND ABLATION  03/19/10  . CARDIOVERSION  08/16/08  . CHOLECYSTECTOMY  08/13/02  . CORONARY ARTERY BYPASS GRAFT  1994 & 2005  . FEMORAL-TIBIAL BYPASS GRAFT     Remote  . HERNIA REPAIR   06/03/07  . VASECTOMY  01/28/08    Allergies  Allergen Reactions  . Fentanyl And Related Other (See Comments)    Hallucinations    Immunization History  Administered Date(s) Administered  . Influenza Split 04/20/2014  . Influenza, High Dose Seasonal PF 05/17/2016  . Influenza,inj,Quad PF,36+ Mos 03/10/2015  . Influenza-Unspecified 06/02/2012  . Pneumococcal Conjugate-13 02/23/2016    Family History  Problem Relation Age of Onset  . Lung cancer Mother     smoked  . Heart failure Father   . Stroke Sister      Current Outpatient Prescriptions:  .  aspirin 81 MG tablet, Take 81 mg by mouth daily., Disp: , Rfl:  .  calcium carbonate (OS-CAL) 600 MG TABS, Take 600 mg by mouth 2 (two) times daily with a meal., Disp: , Rfl:  .  cholecalciferol (VITAMIN D) 1000 UNITS tablet, Take 1,000 Units by mouth 2 (two) times daily., Disp: , Rfl:  .  famotidine (PEPCID) 20 MG tablet, One at bedtime, Disp: 30 tablet, Rfl: 2 .  folic acid (FOLVITE) 1 MG tablet, Take 1 mg by mouth daily., Disp: , Rfl:  .  niacin (NIASPAN) 500 MG CR tablet, Take 500 mg by mouth once. Takes 4 tabs at bedtime, Disp: , Rfl:  .  pantoprazole (PROTONIX) 40 MG tablet, Take 1 tablet (40 mg total) by mouth daily. Take 30-60 min before first meal of the day, Disp: 30 tablet, Rfl: 2 .  rivaroxaban (XARELTO) 20 MG TABS tablet, Take 1 tablet (20 mg total) by mouth every morning., Disp: 30 tablet, Rfl: 1 .  simvastatin (ZOCOR) 20 MG tablet, TAKE 1 TABLET BY MOUTH EVERY EVENING, Disp: 30 tablet, Rfl: 0 .  valsartan-hydrochlorothiazide (DIOVAN-HCT) 80-12.5 MG tablet, TAKE 1 TABLET BY MOUTH DAILY., Disp: 30 tablet, Rfl: 0   Review of Systems     Objective:   Physical Exam  Constitutional: He is oriented to person, place, and time. He appears well-developed and well-nourished. No distress.  HENT:  Head: Normocephalic and atraumatic.  Right Ear: External ear normal.  Left Ear: External ear normal.  Mouth/Throat: Oropharynx is  clear and moist. No oropharyngeal exudate.  Eyes: Conjunctivae and EOM are normal. Pupils are equal, round, and reactive to light. Right eye exhibits no discharge. Left eye exhibits no discharge. No scleral icterus.  Neck: Normal range of motion. Neck supple. No JVD present. No tracheal deviation present. No thyromegaly present.  Cardiovascular:  Normal rate, regular rhythm and intact distal pulses.  Exam reveals no gallop and no friction rub.   No murmur heard. Miller/Chest: Effort normal. No respiratory distress. He has no wheezes. He has rales. He exhibits no tenderness.  Abdominal: Soft. Bowel sounds are normal. He exhibits no distension and no mass. There is no tenderness. There is no rebound and no guarding.  Musculoskeletal: Normal range of motion. He exhibits no edema or tenderness.  Lymphadenopathy:    He has no cervical adenopathy.  Neurological: He is alert and oriented to person, place, and time. He has normal reflexes. No cranial nerve deficit. Coordination normal.  Skin: Skin is warm and dry. No rash noted. He is not diaphoretic. No erythema. No pallor.  Psychiatric: He has a normal mood and affect. His behavior is normal. Judgment and thought content normal.  Nursing note and vitals reviewed.  Vitals:   11/08/16 1214  BP: 118/64  Pulse: 74  SpO2: 96%  Weight: 209 lb (94.8 kg)  Height: 6' (1.829 m)         Assessment:       ICD-9-CM ICD-10-CM   1. IPF (idiopathic Miller fibrosis) (HCC) 516.31 J84.112 Angiotensin converting enzyme     ANA     Mpo/pr-3 (anca) antibodies     Anti-DNA antibody, double-stranded     Sedimentation rate     Rheumatoid factor     Cyclic citrul peptide antibody, IgG     C-reactive protein     Anti-scleroderma antibody     Sjogrens syndrome-A extractable nuclear antibody     Sjogrens syndrome-B extractable nuclear antibody     Hypersensitivity pnuemonitis profile     CK total and CKMB (cardiac)not at [facility]     Aldolase     ANCA  screen with reflex titer     Miller Function Test     Hepatic function panel       Plan:     IPF (idiopathic Miller fibrosis) (HCC) Despite asbestos exposure and pleural plaques 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   - Need to wean sunscreen   - Some chance of nausea and anorexia with small chance for diarrhea   - no known heart attack risk  -no known bleeding risk,    - need monthly blood work for 6 months - monitor liver function  - possible mortality benefit in pooled analysis   - larger world wide experience  FOllowup 4 - 6 weeks to Pre-bd spiro and dlco only. No lung volume or bd response.  4 - 6  weeks return to see me   Nodule of left lung 69m in feb 2018 left lower lobe (prior 568m  Plan Needs repeat  /super D in > 3 months from feb 2018 - approx June 2018     > 50% of this > 40 min visit spent in face to face counseling or/and coordination of care    Dr. MuBrand MalesM.D., F.[facility].P Miller and Critical Care Medicine Staff Physician  [place] [facility] Pager: [phone], If no answer or between  15:00h - 7:00h: call [phone]  11/08/2016 1:20 PM

## 2016-11-08 NOTE — Patient Instructions (Signed)
IPF (idiopathic pulmonary fibrosis) (HCC) youy have IPF disease; you are behaving like that This disease is progressive WE can try to slow it down Use oxygen portable  Plan - do autoimmune panel - if negative then we can commit to esbriet  - do LFT test in anticipation of esbriet start - read about esbriet  -  Esbriet only slow down progression, 1 out of 6 patients   - this means extension in quality of life but no difference in symptoms   -  3 pill three times daily, slow titration.   - Need to wean sunscreen   - Some chance of nausea and anorexia with small chance for diarrhea   - no known heart attack risk  -no known bleeding risk,    - need monthly blood work for 6 months - monitor liver function  - possible mortality benefit in pooled analysis   - larger world wide experience  FOllowup 4 - 6 weeks to Pre-bd spiro and dlco only. No lung volume or bd response.  4 - 6  weeks return to see me

## 2016-11-08 NOTE — Telephone Encounter (Signed)
There was no message needed.  Unable to close encounter myself.  Please close.  Thank you, Troy Miller

## 2016-11-08 NOTE — Assessment & Plan Note (Addendum)
28m in feb 2018 left lower lobe (prior 521m  Plan Needs repeat  /super D in > 3 months from feb 2018 - approx June 2018

## 2016-11-08 NOTE — Assessment & Plan Note (Addendum)
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   - Need to wean sunscreen   - Some chance of nausea and anorexia with small chance for diarrhea   - no known heart attack risk  -no known bleeding risk,    - need monthly blood work for 6 months - monitor liver function  - possible mortality benefit in pooled analysis   - larger world wide experience  FOllowup 4 - 6 weeks to Pre-bd spiro and dlco only. No lung volume or bd response.  4 - 6  weeks return to see me

## 2016-11-08 NOTE — Addendum Note (Signed)
Addended by: Collier Salina on: 11/08/2016 01:27 PM   Modules accepted: Orders

## 2016-11-09 LAB — CK TOTAL AND CKMB (NOT AT ARMC)
CK, MB: 4.4 ng/mL (ref 0.0–5.0)
RELATIVE INDEX: 3.8 (ref 0.0–4.0)
Total CK: 115 U/L (ref 7–232)

## 2016-11-11 LAB — ALDOLASE: ALDOLASE: 3.8 U/L (ref <=8.1)

## 2016-11-11 LAB — ANGIOTENSIN CONVERTING ENZYME: Angiotensin-Converting Enzyme: 41 U/L (ref 9–67)

## 2016-11-11 LAB — ANA: ANA: NEGATIVE

## 2016-11-11 LAB — CYCLIC CITRUL PEPTIDE ANTIBODY, IGG: Cyclic Citrullin Peptide Ab: <16 Units

## 2016-11-11 LAB — RHEUMATOID FACTOR

## 2016-11-12 ENCOUNTER — Other Ambulatory Visit: Payer: Self-pay | Admitting: Internal Medicine

## 2016-11-12 LAB — ANTI-DNA ANTIBODY, DOUBLE-STRANDED: DS DNA AB: 1 [IU]/mL

## 2016-11-12 LAB — ANTI-SCLERODERMA ANTIBODY: Scleroderma (Scl-70) (ENA) Antibody, IgG: <1

## 2016-11-12 LAB — ANCA SCREEN W REFLEX TITER: ANCA SCREEN: NEGATIVE

## 2016-11-12 LAB — MPO/PR-3 (ANCA) ANTIBODIES
Myeloperoxidase Abs: <1
Serine Protease 3: <1

## 2016-11-12 LAB — SJOGRENS SYNDROME-B EXTRACTABLE NUCLEAR ANTIBODY: SSB (LA) (ENA) ANTIBODY, IGG: NEGATIVE

## 2016-11-12 LAB — SJOGRENS SYNDROME-A EXTRACTABLE NUCLEAR ANTIBODY: SSA (RO) (ENA) ANTIBODY, IGG: NEGATIVE

## 2016-11-12 MED ORDER — FAMOTIDINE 20 MG PO TABS: ORAL_TABLET | ORAL | 0 refills | Status: DC

## 2016-11-12 MED ORDER — PANTOPRAZOLE SODIUM 40 MG PO TBEC: 40.0000 mg | DELAYED_RELEASE_TABLET | Freq: Every day | ORAL | 0 refills | Status: DC

## 2016-11-14 NOTE — Progress Notes (Signed)
ATC pt x3 - line immediately went to fast-busy signal.  WCB.

## 2016-11-15 ENCOUNTER — Telehealth: Payer: Self-pay

## 2016-11-15 ENCOUNTER — Ambulatory Visit: Payer: Medicare Other | Admitting: Internal Medicine

## 2016-11-15 NOTE — Telephone Encounter (Signed)
PA approved for Esbriet 23m until 07/07/2017.

## 2016-11-15 NOTE — Progress Notes (Signed)
ATC home number, line was busy. ATC work number and the line didn't ring. WCB.

## 2016-11-16 LAB — HYPERSENSITIVITY PNUEMONITIS PROFILE

## 2016-11-18 ENCOUNTER — Ambulatory Visit (INDEPENDENT_AMBULATORY_CARE_PROVIDER_SITE_OTHER)
Admission: RE | Admit: 2016-11-18 | Discharge: 2016-11-18 | Disposition: A | Discharge status: Home / Self Care | Payer: Medicare Other | Source: Ambulatory Visit | Attending: Internal Medicine | Admitting: Internal Medicine

## 2016-11-18 ENCOUNTER — Encounter: Payer: Self-pay | Admitting: Emergency Medicine

## 2016-11-18 DIAGNOSIS — R911 Solitary pulmonary nodule: Secondary | ICD-10-CM

## 2016-11-18 NOTE — Progress Notes (Signed)
ATC pt x 3. Line still rang busy. Letter has been mailed to home address. Will sign off.

## 2016-11-21 ENCOUNTER — Telehealth: Payer: Self-pay | Admitting: Internal Medicine

## 2016-11-21 MED ORDER — PIRFENIDONE 267 MG PO CAPS: 3.0000 | ORAL_CAPSULE | Freq: Three times a day (TID) | ORAL | 6 refills | Status: DC

## 2016-11-21 NOTE — Telephone Encounter (Signed)
Spoke with Elberta Fortis at [employer] - states that they are showing the Jacklynn Bue is covered but he has high co-pay of [phone] a month at this point. Pt will need to apply for co-pay assistance that will discount it further.  Elberta Fortis is going to put in a request for someone to contact him about assistance. Pt needs to also contact the manufacturer for assistance options.   Jannette Spanner, [place]  11/15/16 4:27 PM  Note    PA approved for Esbriet 250m until 07/07/2017.     ----- C[place]eta Minsat [place].  Rep states that they have attempted to call the patient and have left messages but no return calls.  Aware that I will relay message to patient if I speak with him.  Pt can either get assistance through [employer]or outside manufacturer assistance program Need a rx faxed to [phone] note requesting case be re-opened. (this has been resent to [place]  ATC patient, wcb - to discuss co-pay and need to contact [place].

## 2016-11-22 ENCOUNTER — Encounter: Payer: Self-pay | Admitting: Cardiovascular Disease

## 2016-11-22 ENCOUNTER — Ambulatory Visit (INDEPENDENT_AMBULATORY_CARE_PROVIDER_SITE_OTHER): Payer: Medicare Other | Admitting: Cardiovascular Disease

## 2016-11-22 VITALS — BP 104/70 | HR 51 | Ht 72.0 in | Wt 209.0 lb

## 2016-11-22 DIAGNOSIS — J61 Pneumoconiosis due to asbestos and other mineral fibers: Secondary | ICD-10-CM

## 2016-11-22 DIAGNOSIS — Z79899 Other long term (current) drug therapy: Secondary | ICD-10-CM

## 2016-11-22 DIAGNOSIS — I714 Abdominal aortic aneurysm, without rupture, unspecified: Secondary | ICD-10-CM

## 2016-11-22 DIAGNOSIS — I10 Essential (primary) hypertension: Secondary | ICD-10-CM | POA: Diagnosis not present

## 2016-11-22 DIAGNOSIS — Z7901 Long term (current) use of anticoagulants: Secondary | ICD-10-CM | POA: Diagnosis not present

## 2016-11-22 DIAGNOSIS — I350 Nonrheumatic aortic (valve) stenosis: Secondary | ICD-10-CM

## 2016-11-22 DIAGNOSIS — I2510 Atherosclerotic heart disease of native coronary artery without angina pectoris: Secondary | ICD-10-CM | POA: Diagnosis not present

## 2016-11-22 DIAGNOSIS — E782 Mixed hyperlipidemia: Secondary | ICD-10-CM

## 2016-11-22 DIAGNOSIS — I739 Peripheral vascular disease, unspecified: Secondary | ICD-10-CM | POA: Diagnosis not present

## 2016-11-22 NOTE — Progress Notes (Signed)
ATC pt, Line busy. WCB.

## 2016-11-22 NOTE — Patient Instructions (Signed)
Your physician has requested that you have an echocardiogram. Echocardiography is a painless test that uses sound waves to create images of your heart. It provides your doctor with information about the size and shape of your heart and how well your heart's chambers and valves are working. This procedure takes approximately one hour. There are no restrictions for this procedure.  Your physician has requested that you have an abdominal aorta duplex. During this test, an ultrasound is used to evaluate the aorta. Allow 30 minutes for this exam. Do not eat after midnight the day before and avoid carbonated beverages  Your physician has requested that you have a lower extremity arterial exercise duplex. During this test, exercise and ultrasound are used to evaluate arterial blood flow in the legs. Allow one hour for this exam. There are no restrictions or special instructions.   Your physician recommends that you return for lab work fasting.  Your physician recommends that you schedule a follow-up appointment in: 4 months

## 2016-11-22 NOTE — Progress Notes (Signed)
Patient ID: Troy Miller, male   DOB: 04-Nov-1940, 76 y.o.   MRN: [id number]     HPI: Troy Miller is a 76 y.o. male who presents for a 50 month cardiology and sleep evaluation.   Troy Miller has a history of CAD and in 1994 underwent initial CABG revascularization surgery. In May 2005 he underwent redo surgery with a RIMA to the LAD, vein graft to distal RCA by [name]. His previous LIMA graft was preserved and supplied the OM1 vessel. He has history of atrial flutter status post ablation by [name] as well as a history of PAF. He has documented peripheral vascular disease with abdominal aortic aneurysm, as well as common iliac artery aneurysms. He status post right fem-tib bypass graft surgery. Additional problems include hypertension as well as mixed hyperlipidemia. He also has mild aortic stenosis and documentation of an infrarenal abdominal aortic aneurysm.   A followup echo Doppler study on 05/11/2013 showed an ejection fraction in the 45-50% range with mild inferior hypokinesis.  There was grade 1 diastolic dysfunction.  His aortic valve was moderately calcified and there was evidence for mild aortic valve stenosis with a peak gradient of 19 and mean gradient of 10. Aortic valve area was 1.5 1.64 cm.  There was mild aortic root dilatation at 42 mm mild dilatation of the ascending aorta..  There was mitral regurgitation.  A follow-up abdominal aortic Doppler study on April 29, 2014 demonstrated an infrarenal fusiform aneurysm at 3.63.7 with mild amount of atherosclerosis visualized throughout.  The right and left proximal common iliac arteries also demonstrated aneurysmal dilatation.  The right common iliac artery measured 2.3.  A 2.3 without evidence for stenosis.  Left common iliac artery measured 3.2 x 3.0 with mild amount of thrombus and low flow velocity.  He underwent a follow-up ultrasound last week which showed slight progression.  His aneurysm in the distal aorta, now  measured 4.03.7.  There was a new finding of a right common iliac artery aneurysm measuring 2.2 by 2.3 cm.  The left common iliac artery was stable at 3.2 x 3 point 0 cm.  The IVC veins were patent.  A follow-up evaluation was recommended in one year.  A follow-up echo Doppler study on 11/04/2014 demonstrated an EF 50-55% without regional wall motion abnormalities.  There was evidence for mild aortic valve stenosis valve area of 1. 4 9 and 1.43 by measurements. Mean gradient was 14 and peak gradient 32 mmHg.  He saw [name] with complaints of chest discomfort in 2016.  A nuclear perfusion study was  on 02/10/2015:  Ejection fraction was 47%. There was evidence for prior inferior perfusion defect suggesting prior infarction.  There was no ischemia.   Presently, Troy Miller denies chest pain.  He recently underwent several treatment of skin lesions on his face by his dermatologist and will need additional treatment for 1.  On his nose which he states was positive for mild malignancy. He denies anginal symptoms.  He denies PND, orthopnea.  He denies bleeding.   Troy Miller has a history of obstructive sleep apnea dating back to 2005.  His initial sleep study showed severe sleep apnea with an AHI of 33.9, and in addition to obstructive events had central events.  Initial O2 desaturation was 81%.  Remotely he had seen Dr. [name], he had undergone a trial of BiPAP, but reportedly had felt better with CPAP therapy.  He has not used CPAP therapy for at least 3-4 years.  His DME company had been [employer] which is no longer in business.  He admits to daytime sleepiness.  He does snore.  He denies any recent CHF symptomatology.  When I last saw him, I deferred him for a new sleep study.  This was done on 12/22/2015.  This confirmed moderate sleep apnea with an AHI overall of 26 per hour.  However, sleep apnea was severe during [name] sleep with an AHI 51.4.  He had significant oxygen desaturation to 82% in time  below 88% was 76.4 minutes.  There was moderate snoring.  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  He received  a new CPAP machine.  He admits to 100% compliance.  His sleep has improved.  He denies daytime sleepiness and is unaware of breakthrough snoring.  He is now also being seen at the [place].  Remotely he developed cough secondary to ace inhibition.  He is seeing Dr. 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  Occasionally he require supplemental oxygen if he is walking but typically does not use oxygen routinely.  He is not had any recent evaluation for his PVD to his lower extremities and abdominal aorta.  He presents for evaluation.  Past Medical History:  Diagnosis Date  . AAA (abdominal aortic aneurysm) ([place])   . Aortic stenosis, mild    07/10/12 EF 50-55% on echo-mitral annular ca+ also  . Atrial fibrillation ([place])    03/19/10 ablation-[name]  . Atrial flutter ([place])   . CAD (coronary artery disease)   . History of stress test    Myoview 8/16:  EF 47%, inferior scar, no ischemia, Intermediate risk (low EF)  . Hyperlipemia   . Hypertension   . Iliac artery aneurysm ([place])   . PVD (peripheral vascular disease) ([place])    remote right fem-tib bypass graft sugery  . 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 BACK SURGERY  2003  . CARDIAC ELECTROPHYSIOLOGY STUDY AND ABLATION  03/19/10  . CARDIOVERSION  08/16/08  . CHOLECYSTECTOMY  08/13/02  . CORONARY ARTERY BYPASS GRAFT  1994 & 2005  . FEMORAL-TIBIAL BYPASS GRAFT     Remote  . HERNIA REPAIR  06/03/07  . VASECTOMY  01/28/08    Allergies  Allergen Reactions  . Fentanyl And Related Other (See Comments)    Hallucinations    Current Outpatient Prescriptions  Medication Sig Dispense Refill  . aspirin 81 MG tablet Take 81 mg by mouth daily.    . calcium  carbonate (OS-CAL) 600 MG TABS Take 600 mg by mouth 2 (two) times daily with a meal.    . cholecalciferol (VITAMIN D) 1000 UNITS tablet Take 1,000 Units by mouth 2 (two) times daily.    . famotidine (PEPCID) 20 MG tablet One at bedtime 90 tablet 0  . folic acid (FOLVITE) 1 MG tablet Take 1 mg by mouth daily.    . niacin (NIASPAN) 500 MG CR tablet Take 500 mg by mouth once. Takes 4 tabs at bedtime    . pantoprazole (PROTONIX) 40 MG tablet Take 1 tablet (40 mg total) by mouth daily. Take 30-60 min before first meal of the day 90 tablet 0  . rivaroxaban (XARELTO) 20 MG TABS tablet Take 1 tablet (20 mg total) by mouth every morning. 30 tablet 1  .  simvastatin (ZOCOR) 20 MG tablet TAKE 1 TABLET BY MOUTH EVERY EVENING 30 tablet 0  . valsartan-hydrochlorothiazide (DIOVAN-HCT) 80-12.5 MG tablet TAKE 1 TABLET BY MOUTH DAILY. 30 tablet 0  . Pirfenidone (ESBRIET) 267 MG CAPS Take 3 capsules by mouth 3 (three) times daily. (Patient not taking: Reported on 11/22/2016) 270 capsule 6   No current facility-administered medications for this visit.     Social History   Social History  . Marital status: Single    Spouse name: N/A  . Number of children: N/A  . Years of education: N/A   Occupational History  . Not on file.   Social History Main Topics  . Smoking status: Former Smoker    Packs/day: 2.00    Years: 25.00    Types: Cigarettes    Quit date: 04/26/1986  . Smokeless tobacco: Never Used  . Alcohol use No  . Drug use: No  . Sexual activity: Not on file   Other Topics Concern  . Not on file   Social History Narrative  . No narrative on file    Family History  Problem Relation Age of Onset  . Lung cancer Mother        smoked  . Heart failure Father   . Stroke Sister    Socially he is divorced has 3 children and 2 grandchildren. He is not routine exercise. No tobacco or alcohol use.  ROS General: Negative; No fevers, chills, or night sweats;  HEENT: Now with a hearing aid in his  right ear due to decreased hearing., sinus congestion, difficulty swallowing Pulmonary: Shortness of breath, positive for asbestosis. Cardiovascular:  See HPI: No change in claudication GI: Negative; No nausea, vomiting, diarrhea, or abdominal pain GU: Negative; No dysuria, hematuria, or difficulty voiding Musculoskeletal: Negative; no myalgias, joint pain, or weakness Hematologic/Oncology: Negative; no easy bruising, bleeding Endocrine: Negative; no heat/cold intolerance; no diabetes Neuro: Occasional transient toe paresthesias; no changes in balance, headaches Skin: Negative; No rashes or skin lesions Psychiatric: Negative; No behavioral problems, depression Sleep: Positive for sleep apnea, currently untreated for the past 3-4 years.  Originally diagnosed in 2005, both obstructive and central events.  Positive for daytime sleepiness, hypersomnolence; no bruxism, restless legs, hypnogognic hallucinations, no cataplexy Other comprehensive 14 point system review is negative.  PE BP 104/70   Pulse (!) 51   Ht 6' (1.829 m)   Wt 209 lb (94.8 kg)   BMI 28.35 kg/m    Wt Readings from Last 3 Encounters:  11/22/16 209 lb (94.8 kg)  11/08/16 209 lb (94.8 kg)  09/06/16 211 lb (95.7 kg)   General: Alert, oriented, no distress.  Skin: normal turgor, no rashes, warm and dry HEENT: Normocephalic, atraumatic. Pupils equal round and reactive to light; sclera anicteric; extraocular muscles intact;  Nose without nasal septal hypertrophy Mouth/Parynx benign; Mallinpatti scale 3 Neck: No JVD, no carotid bruits; normal carotid upstroke Lungs: clear to ausculatation and percussion; no wheezing or rales Chest wall: without tenderness to palpitation Heart: PMI not displaced, RRR, s1 s2 normal, 2/6 systolic murmur in his aortic region compatible with aortic stenosis.  No appreciated aortic insufficiency., no diastolic murmur, no rubs, gallops, thrills, or heaves Abdomen: Mild diastases recti, nontender;  no hepatosplenomehaly, BS+; abdominal aorta nontender and not dilated by palpation. Back: no CVA tenderness Pulses 2+ Musculoskeletal: full range of motion, normal strength, no joint deformities Extremities: no clubbing cyanosis or edema, Homan's sign negative  Neurologic: grossly nonfocal; Cranial nerves grossly wnl Psychologic: Normal mood and affect  ECG (independently read by me): sinus bradycardia 51 bpm.  Left axis deviation.  Inferior Q waves with early transition.  Normal intervals.  April 2017 ECG (independently read by me): Normal sinus rhythm with PACs.  Heart rate 62 bpm.  Inferior posterior MI  November 2016 ECG (independently read by me):  Normal sinus rhythm at 62 bpm.   Incomplete right bundle branch block.  Prior ECG (independently read by me): Sinus bradycardia at 58 bpm with an isolate a PVC and occasional PACs with mild sinus arrhythmia.  Evidence for prior inferior posterior infarct.  Nonspecific ST changes.  Prior 11/05/2013 ECG (independently read by me): Sinus bradycardia at 49 beats per minute.  QTc interval 426 ms.  PR interval 174 ms.  Mild RV conduction delay  Prior 04/26/2013 ECG: Marked sinus bradycardia 43 beats per minute. Left axis deviation. 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  LABS:  BMP Latest Ref Rng & Units 05/17/2016 02/10/2015 01/31/2015  Glucose 70 - 99 mg/dL 96 103(H) 94  BUN 6 - 23 mg/dL 15 26(H) 24(H)  Creatinine 0.40 - 1.50 mg/dL 1.13 1.27 1.52(H)  Sodium 135 - 145 mEq/L 139 138 137  Potassium 3.5 - 5.1 mEq/L 4.8 4.5 4.5  Chloride 96 - 112 mEq/L 105 103 105  CO2 19 - 32 mEq/L _0 Calcium 8.4 - 10.5 mg/dL 10.2 10.5 10.2   Hepatic Function Latest Ref Rng & Units 11/08/2016 05/20/2014 11/05/2013  Total Protein 6.0 - 8.3 g/dL 8.0 7.8 7.8  Albumin 3.5 - 5.2 g/dL 4.8 4.6 4.7  AST 0 - 37 U/L _1 ALT 0 - 53 U/L _2 Alk Phosphatase 39 - 117 U/L 60 72 66  Total Bilirubin 0.2 - 1.2 mg/dL 0.8 0.8 0.7   Bilirubin, Direct 0.0 - 0.3 mg/dL 0.2 - -   CBC Latest Ref Rng & Units 05/17/2016 01/31/2015 05/20/2014  WBC 4.0 - 10.5 K/uL 12.5(H) 13.2(H) 11.0(H)  Hemoglobin 13.0 - 17.0 g/dL 16.0 16.1 16.1  Hematocrit 39.0 - 52.0 % 47.6 47.4 46.8  Platelets 150.0 - 400.0 K/uL 217.0 175.0 194   Lab Results  Component Value Date   MCV 90.9 05/17/2016   MCV 92.6 01/31/2015   MCV 90.3 05/20/2014   No results found for: HGBA1C  Lab Results  Component Value Date   TSH 3.04 05/17/2016   Lipid Panel     Component Value Date/Time   CHOL 103 05/20/2014 0823   CHOL 118 05/05/2013 0839   TRIG 135 05/20/2014 0823   TRIG 202 (H) 05/05/2013 0839   HDL 39 (L) 05/20/2014 0823   HDL 38 (L) 05/05/2013 0839   CHOLHDL 2.6 05/20/2014 0823   VLDL 27 05/20/2014 0823   LDLCALC 37 05/20/2014 0823   LDLCALC 40 05/05/2013 0839     RADIOLOGY: No results found.  IMPRESSION: 1. Coronary artery disease involving native coronary artery of native heart without angina pectoris   2. Mild aortic stenosis   3. Essential hypertension   4. Mixed hyperlipidemia   5. PVD (peripheral vascular disease) ([place])   6. Abdominal aortic aneurysm (AAA) without rupture ([place])   7. Medication management   8. Asbestosis ([place])   9. Chronic anticoagulation     ASSESSMENT AND PLAN: Troy Miller  Is a 76 year old Caucasian male who has CAD dating back to 1994 when he underwent his initial CABG surgery. He is 12 years status post redo bypass surgery in 2005. He has peripheral vascular disease.  He has mild aortic stenosis with low-normal EF on his last echo.  His last nuclear study revealed inferior scar without associated ischemia. 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  He has not had a follow-up evaluation of his lower extremity PVD and is status post remote  right fem-tib bypass graft surgery in 1996.  His blood pressure today is stable on valsartan HCT 80/12.5 mg which he is tolerating.  His physical exam suggests a murmur of aortic stenosis.  I have recommended he undergo a follow-up echo Doppler study for reevaluation.  He will also undergo go Doppler study in follow-up of his abdominal aorta aneurysm and lower extremity arterial Doppler study.  He has remote history of atrial fibrillation and is status post ablation.  He is on anticoagulation therapy.  He is on simvastatin and niacin for his mixed hyperlipidemia.  He has not had recent lab work and a complete set of fasting laboratory will be obtained.  I discussed with him his sleep study that I had ordered last year.  He admits to 100% compliance with his new CPAP machine.  He denies any residual daytime sleepiness or breakthrough snoring.  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  I will see him in 4 months for reevaluation.  Troy Sine, MD, [facility]  11/24/2016 11:18 AM

## 2016-11-23 ENCOUNTER — Other Ambulatory Visit: Payer: Self-pay | Admitting: Cardiovascular Disease

## 2016-11-25 NOTE — Telephone Encounter (Signed)
Rx(s) sent to pharmacy electronically.

## 2016-11-27 ENCOUNTER — Telehealth: Payer: Self-pay | Admitting: Internal Medicine

## 2016-11-27 NOTE — Telephone Encounter (Signed)
  Notes recorded by Brand Males, MD on 11/13/2016 at 5:19 PM EDT autoimmjne negative (labwork)  Notes recorded by Brand Males, MD on 11/20/2016 at 6:35 PM EDT No change in CT aug 2016 -> May 2018. Autoimmune neg. Will see what his PFTs are during June 2018 visit and discuss next steps ----------------- atc pt X3, line rang to fast busy signal.  Wcb.

## 2016-11-27 NOTE — Telephone Encounter (Signed)
atc pt X3, line rang to fast busy signal.  Wcb.

## 2016-11-28 NOTE — Telephone Encounter (Signed)
ATC- received busy signal wcb

## 2016-11-29 NOTE — Telephone Encounter (Signed)
Attempted to call the pt---fast busy signal on the phone.  Will try back.

## 2016-11-29 NOTE — Telephone Encounter (Signed)
ATC ---received busy signal---wcb for the pt.

## 2016-12-03 NOTE — Telephone Encounter (Signed)
Attempted to csall the pt and fast busy signal.  Will need to speak with pt directly if he calls back.  Will sign off of message since we have contacted pt many times.

## 2016-12-03 NOTE — Telephone Encounter (Signed)
Attempted to call the pt to follow up on [place] contacting him to set him up with the assistance options.  Phone rings busy signal.  Will try back again.

## 2016-12-04 NOTE — Telephone Encounter (Signed)
Closing per protocol

## 2016-12-06 ENCOUNTER — Other Ambulatory Visit: Payer: Self-pay

## 2016-12-06 ENCOUNTER — Other Ambulatory Visit: Payer: Medicare Other | Admitting: *Deleted

## 2016-12-06 ENCOUNTER — Ambulatory Visit (HOSPITAL_COMMUNITY): Payer: Medicare Other | Attending: Cardiovascular Disease

## 2016-12-06 DIAGNOSIS — I350 Nonrheumatic aortic (valve) stenosis: Secondary | ICD-10-CM | POA: Insufficient documentation

## 2016-12-06 DIAGNOSIS — E785 Hyperlipidemia, unspecified: Secondary | ICD-10-CM | POA: Insufficient documentation

## 2016-12-06 DIAGNOSIS — I4892 Unspecified atrial flutter: Secondary | ICD-10-CM

## 2016-12-06 DIAGNOSIS — Z87891 Personal history of nicotine dependence: Secondary | ICD-10-CM | POA: Insufficient documentation

## 2016-12-06 DIAGNOSIS — E782 Mixed hyperlipidemia: Secondary | ICD-10-CM

## 2016-12-06 DIAGNOSIS — I2510 Atherosclerotic heart disease of native coronary artery without angina pectoris: Secondary | ICD-10-CM | POA: Insufficient documentation

## 2016-12-06 DIAGNOSIS — Z951 Presence of aortocoronary bypass graft: Secondary | ICD-10-CM | POA: Diagnosis not present

## 2016-12-06 DIAGNOSIS — I10 Essential (primary) hypertension: Secondary | ICD-10-CM | POA: Diagnosis not present

## 2016-12-07 LAB — CBC WITH DIFFERENTIAL/PLATELET
BASOS: 0 %
Basophils Absolute: 0 10*3/uL (ref 0.0–0.2)
EOS (ABSOLUTE): 0.3 10*3/uL (ref 0.0–0.4)
EOS: 3 %
Hematocrit: 45.9 % (ref 37.5–51.0)
Hemoglobin: 15.5 g/dL (ref 13.0–17.7)
IMMATURE GRANS (ABS): 0 10*3/uL (ref 0.0–0.1)
IMMATURE GRANULOCYTES: 0 %
LYMPHS: 19 %
Lymphocytes Absolute: 2 10*3/uL (ref 0.7–3.1)
MCH: 30.5 pg (ref 26.6–33.0)
MCHC: 33.8 g/dL (ref 31.5–35.7)
MCV: 90 fL (ref 79–97)
MONOS ABS: 1.3 10*3/uL — AB (ref 0.1–0.9)
Monocytes: 12 %
NEUTROS PCT: 66 %
Neutrophils Absolute: 7 10*3/uL (ref 1.4–7.0)
Platelets: 167 10*3/uL (ref 150–379)
RBC: 5.09 x10E6/uL (ref 4.14–5.80)
RDW: 14.4 % (ref 12.3–15.4)
WBC: 10.7 10*3/uL (ref 3.4–10.8)

## 2016-12-07 LAB — BASIC METABOLIC PANEL
BUN/Creatinine Ratio: 15 (ref 10–24)
BUN: 19 mg/dL (ref 8–27)
CO2: 20 mmol/L (ref 18–29)
CREATININE: 1.27 mg/dL (ref 0.76–1.27)
Calcium: 9.7 mg/dL (ref 8.6–10.2)
Chloride: 103 mmol/L (ref 96–106)
GFR calc Af Amer: 63 mL/min/{1.73_m2} (ref >59)
GFR, EST NON AFRICAN AMERICAN: 54 mL/min/{1.73_m2} — AB (ref >59)
Glucose: 104 mg/dL — ABNORMAL HIGH (ref 65–99)
Potassium: 5.1 mmol/L (ref 3.5–5.2)
SODIUM: 138 mmol/L (ref 134–144)

## 2016-12-07 LAB — LIPID PANEL
CHOL/HDL RATIO: 2.8 ratio (ref 0.0–5.0)
Cholesterol, Total: 95 mg/dL — ABNORMAL LOW (ref 100–199)
HDL: 34 mg/dL — AB (ref >39)
LDL Calculated: 34 mg/dL (ref 0–99)
Triglycerides: 136 mg/dL (ref 0–149)
VLDL CHOLESTEROL CAL: 27 mg/dL (ref 5–40)

## 2016-12-07 LAB — TSH: TSH: 3.44 u[IU]/mL (ref 0.450–4.500)

## 2016-12-09 ENCOUNTER — Encounter: Payer: Self-pay | Admitting: Internal Medicine

## 2016-12-09 ENCOUNTER — Ambulatory Visit (INDEPENDENT_AMBULATORY_CARE_PROVIDER_SITE_OTHER): Payer: Medicare Other | Admitting: Internal Medicine

## 2016-12-09 VITALS — BP 126/70 | HR 88 | Ht 71.0 in | Wt 208.0 lb

## 2016-12-09 DIAGNOSIS — I2510 Atherosclerotic heart disease of native coronary artery without angina pectoris: Secondary | ICD-10-CM | POA: Diagnosis not present

## 2016-12-09 DIAGNOSIS — R911 Solitary pulmonary nodule: Secondary | ICD-10-CM | POA: Diagnosis not present

## 2016-12-09 DIAGNOSIS — J84112 Idiopathic pulmonary fibrosis: Secondary | ICD-10-CM | POA: Diagnosis not present

## 2016-12-09 LAB — PULMONARY FUNCTION TEST
DL/VA % PRED: 41 %
DL/VA: 1.91 ml/min/mmHg/L
DLCO COR % PRED: 32 %
DLCO COR: 11.02 ml/min/mmHg
DLCO unc % pred: 34 %
DLCO unc: 11.7 ml/min/mmHg
FEF 25-75 Pre: 2.31 L/sec
FEF2575-%Pred-Pre: 106 %
FEV1-%Pred-Pre: 94 %
FEV1-PRE: 2.92 L
FEV1FVC-%Pred-Pre: 105 %
FEV6-%Pred-Pre: 94 %
FEV6-Pre: 3.8 L
FEV6FVC-%Pred-Pre: 105 %
FVC-%PRED-PRE: 89 %
FVC-PRE: 3.84 L
PRE FEV6/FVC RATIO: 99 %
Pre FEV1/FVC ratio: 76 %

## 2016-12-09 NOTE — Assessment & Plan Note (Signed)
9% decline overall in lung function (FVC/DLCO in 2 years  Plan Start esbriet per schedule - we discussed this   - do not forget to wear sunscreen  Followup 4 weeks with me or APP to do LFT blood test and monitor tolerance

## 2016-12-09 NOTE — Assessment & Plan Note (Signed)
55m in [facility] feb 2018  Plan CT chest wo contrast in auugust/sept 2018 - 6 month followup

## 2016-12-09 NOTE — Progress Notes (Signed)
PFT done today. 

## 2016-12-09 NOTE — Progress Notes (Signed)
Subjective:     Patient ID: Troy Miller, male   DOB: [birth year], 76 y.o.   MRN: [id number]  HPI Brief patient profile:  75 yowm quit smoking 1987 no problem breathing but am cough/ resolved, did fine with 2  cabg's last one 2005 with new onset of doe x summer 2015 with neg cardiac eval so referred to pulmonary clinic 03/10/2015 by Troy Miller with ? 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   History of Present Illness  03/10/2015 1st Troy Miller Pulmonary office visit/ Miller   Chief Complaint  Patient presents with  . Pulmonary Consult    Referred by Dr. Kathlen Miller. Pt c/o SOB for the past 2 months. He gets winded walking up a slight incline and "not far" on a flat surface.   slowed by hips and doe if walks fast past or up inclines = MMRC 1 assoc with minimal chronic am cough no change x years  Onset doe was insidious/ pattern is minimally progressive   rec No change rx   04/14/2015 1st Troy Miller Pulmonary office visit/ Miller   Chief Complaint  Patient presents with  . Follow-up    PFT done today. Pt states his breathing is unchanged. No new co's today.    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 Follow-up    CXR done today. He states his breathing is gradually worsening. He also has noticed increased cough, occ prod with yellow sputum.    MMRC2 = can't walk a nl pace on a flat grade s sob but does fine slow and flat eg [employer] leaning on cart  rec Offered 02 / declined     09/06/2016  f/u ov/Miller re: ILD/ prob asbestosis  Chief Complaint  Patient presents with  . Follow-up    Breathing is unchanged. Here to discuss last  ct chest results.   still mmrc =2 / not using 02 or monitoring it either/ no on gerd rx    No obvious day to day or daytime variability or assoc chronic cough or cp or chest tightness, subjective wheeze or overt sinus or hb symptoms. No unusual exp hx or h/o childhood pna/ asthma or knowledge of premature birth.  Sleeping ok without nocturnal  or early am exacerbation  of respiratory  c/o's or need for noct saba. Also denies any obvious fluctuation of symptoms with weather or environmental changes or other aggravating or alleviat   OV 11/08/2016  Chief Complaint  Patient presents with  . Follow-up    Pt here for a second opinion from Troy Miller. Pt states his breathing is unchanged since last OV - DOE. Pt c/o prod cough with clear to yellow mucus. Pt denies CP/tightness and f/c/s.    S: ACCP ILD Questionnaire  76 year old male referred for second opinion on pulmonary fibrosis/interstitial lung disease. He started seeing Troy Miller in late 2016 for interstitial lung disease. Visualization of the pulmonary function test shows mild progression. In fact he attest to me that he's had dyspnea for 4-5 years and is slowly and gradually progressive. He says  that he gets short of breath when hurrying on level ground or walking up a slight hill. And he has to stop for breath and he walks 100 yards on level ground. He also has a dry cough in the daytime for the same amount of time. It is mild and does wake him up in the middle of the night. This a dry cough.  Past medical history: He does endorse acid reflux, dry eyes, mild pedal edema, bruising, joint pain and a history of coronary artery disease status post 5 chest results are also. He denies any personal family history of lung disease including COPD or asthma pulmonary fibrosis or autoimmune disease personal history and family history.  Personal exposures: He started smoking at age 16 to packs a day and quit at age 75. He never did 3 drugs.  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 No sauna . no hot tub no Jacuzzi no burden no mold exposure.  Travel history is lived in [place] all his life except for serving in [place]   Pneumotox: Negative for any medications or radiation or chemotherapy for pulmonary issues   Results for Troy Miller (MRN [id number]) as of 11/08/2016 12:17  Ref. Range 04/14/2015 11:47 05/03/2016 12:52  FVC-Pre Latest Units: L 3.96 3.70  FVC-%PredLowe ICD and I was on call you after finish clinic-Pre Latest Units: % 90 85   Results for Troy Miller (MRN [id number]) as of 11/08/2016 12:17  Ref. Range 04/14/2015 11:47 05/03/2016 12:52  DLCO unc Latest Units: ml/min/mmHg 13.79 12.45  DLCO unc % pred Latest Units: % 40 36   Results for Troy Miller (MRN [id number]) as of 11/08/2016 12:17  Ref. Range 05/05/2013 08:39 11/05/2013 08:12 05/20/2014 08:23 01/31/2015 16:33 05/17/2016 15:16  Eosinophils Absolute Latest Ref Range: 0.0 - 0.7 K/uL    0.4 0.4   IMPRESSION: personally visualized 1. Findings are compatible with tiny progressive interstitial lung disease and are concerning for developing usual interstitial pneumonia (UIP). This is not yet definitive, and repeat evaluation with high-resolution chest CT is suggested in 6-12 months to assess for further temporal changes in the appearance of the lung parenchyma. 2. Some of the previously noted pulmonary nodules are stable, while others are new or have clearly enlarged. The largest of these new nodules in the right lower lobe is in the midst of an area of architectural distortion and fibrosis where there are several thick-walled small cavitary areas, such that  this could simply represent fluid filling some of the small cavities. However, there is also an 8 mm nodule in the left lower lobe which previously measured only 5 mm. Close attention on followup studies is suggested. PET-CT is not recommended at this time given the high likelihood for false-positive. 3. 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 4. Aortic atherosclerosis, in addition to left main and 3 vessel coronary artery disease. Status post median sternotomy for CABG including LIMA to the LAD. 5. There are calcifications of the aortic valve and mitral annulus. Echocardiographic correlation for evaluation of potential valvular dysfunction may be warranted if clinically indicated. 6. Mild cardiomegaly.   Electronically Signed   By: Vinnie Langton M.D.   On: 08/09/2016  11:10   Nuclear Medicine stress test 02/10/15: There was a medium-sized, severe basal to apical inferior perfusion defect suggestive of prior infarction.  No ischemia.  EF 47% with inferior hypokinesis.  Intermediate risk (primarily due to low EF).    OV 12/09/2016  Chief Complaint  Patient presents with  . Follow-up    review PFT.  pt c/o sob with exertion, prod cough with yellow/clear mucus- states this is his baseline.  Has not yet started Esbriet but has received approval.      FU - IPF - diagnosis - given 11/08/16  Here to review diagnostic workup of pulmonary function test and autoimmune lab work. Some function test shows a 3% decline in FVC and 18% decline in DLCO since 2 years. On average to 9% decline which he agrees with subjectively. He is yet to get his Pirfenidone [facility]) but he says it is on his way. His autoimmune test is negative. Given the decline male gender and UIP pattern of given the diagnosis of IPF to him.   Results for DETRON, CARRAS (MRN [id number]) as of 12/09/2016 16:40  Ref. Range 04/14/2015 11:47 05/03/2016 12:52 12/09/2016 15:22  FVC-Pre  Latest Units: L 3.96 3.70 3.84  FVC-%Pred-Pre Latest Units: % 90 85 89  Results for XAYNE, BRUMBAUGH (MRN [id number]) as of 12/09/2016 16:40  Ref. Range 04/14/2015 11:47 05/03/2016 12:52 12/09/2016 15:22  DLCO unc Latest Units: ml/min/mmHg 13.79 12.45 11.70  DLCO unc % pred Latest Units: % 40 36 34    Results for DERRECK, WILTSEY (MRN [id number]) as of 12/09/2016 16:40  Ref. Range 11/08/2016 13:32  Aspergillus flavus Unknown REPORT  A fumigatus #1 Unknown REPORT  A pullulans Unknown REPORT  [place] Serum Unknown REPORT  Aspergillus [place] Unknown REPORT  Anit Nuclear Antibody(ANA) Latest Ref Range: NEGATIVE  NEG  Angiotensin-Converting Enzyme Latest Ref Range: 9 - 67 U/L 41  Cyclic Citrullin Peptide Ab Latest Units: Units <16  ds DNA Ab Latest Units: IU/mL 1  Myeloperoxidase Abs Latest Ref Range: <1.0 AI  <1.0  Serine Protease 3 Latest Ref Range: <1.0 AI  <1.0  RA Latex Turbid. 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   reports that he quit smoking about 30 years ago. His smoking use included Cigarettes. He has a 50.00 pack-year smoking history. He has never used smokeless tobacco.  Past Surgical History:  Procedure Laterality Date  . BACK SURGERY  2003  . CARDIAC ELECTROPHYSIOLOGY STUDY AND ABLATION  03/19/10  . CARDIOVERSION  08/16/08  . CHOLECYSTECTOMY  08/13/02  . CORONARY ARTERY BYPASS GRAFT  1994 & 2005  . FEMORAL-TIBIAL BYPASS  GRAFT     Remote  . HERNIA REPAIR  06/03/07  . VASECTOMY  01/28/08    Allergies  Allergen Reactions  . Fentanyl And Related Other (See Comments)    Hallucinations    Immunization History  Administered Date(s) Administered  . Influenza Split 04/20/2014  . Influenza, High  Dose Seasonal PF 05/17/2016  . Influenza,inj,Quad PF,36+ Mos 03/10/2015  . Influenza-Unspecified 06/02/2012  . Pneumococcal Conjugate-13 02/23/2016    Family History  Problem Relation Age of Onset  . Lung cancer Mother        smoked  . Heart failure Father   . Stroke Sister      Current Outpatient Prescriptions:  .  aspirin 81 MG tablet, Take 81 mg by mouth daily., Disp: , Rfl:  .  calcium carbonate (OS-CAL) 600 MG TABS, Take 600 mg by mouth 2 (two) times daily with a meal., Disp: , Rfl:  .  cholecalciferol (VITAMIN D) 1000 UNITS tablet, Take 1,000 Units by mouth 2 (two) times daily., Disp: , Rfl:  .  famotidine (PEPCID) 20 MG tablet, One at bedtime, Disp: 90 tablet, Rfl: 0 .  folic acid (FOLVITE) 1 MG tablet, Take 1 mg by mouth daily., Disp: , Rfl:  .  niacin (NIASPAN) 500 MG CR tablet, Take 500 mg by mouth once. Takes 4 tabs at bedtime, Disp: , Rfl:  .  pantoprazole (PROTONIX) 40 MG tablet, Take 1 tablet (40 mg total) by mouth daily. Take 30-60 min before first meal of the day, Disp: 90 tablet, Rfl: 0 .  rivaroxaban (XARELTO) 20 MG TABS tablet, Take 1 tablet (20 mg total) by mouth every morning., Disp: 30 tablet, Rfl: 1 .  simvastatin (ZOCOR) 20 MG tablet, TAKE 1 TABLET BY MOUTH EVERY EVENING, Disp: 30 tablet, Rfl: 6 .  valsartan-hydrochlorothiazide (DIOVAN-HCT) 80-12.5 MG tablet, TAKE 1 TABLET BY MOUTH DAILY., Disp: 30 tablet, Rfl: 6 .  Pirfenidone (ESBRIET) 267 MG CAPS, Take 3 capsules by mouth 3 (three) times daily. (Patient not taking: Reported on 12/09/2016), Disp: 270 capsule, Rfl: 6   Review of Systems     Objective:   Physical Exam Vitals:   12/09/16 1558  BP: 126/70  Pulse: 88  SpO2: 93%   Weight: 208 lb (94.3 kg)  Height: _0  (1.803 m)    Estimated body mass index is 29.01 kg/m as calculated from the following:   Height as of this encounter: _1  (1.803 m).   Weight as of this encounter: 208 lb (94.3 kg). Discussion only    Assessment:       ICD-9-CM ICD-10-CM   1. IPF (idiopathic pulmonary fibrosis) (HCC) 516.31 J84.112   2. Nodule of left lung 793.11 R91.1        Plan:     IPF (idiopathic pulmonary fibrosis) (HCC) 9% decline overall in lung function (FVC/DLCO in 2 years  Plan Start esbriet per schedule - we discussed this   - do not forget to wear sunscreen  Followup 4 weeks with me or APP to do LFT blood test and monitor tolerance  Nodule of left lung 28m in LLL feb 2018  Plan CT chest wo contrast in auugust/sept 2018 - 6 month followup  (> 50% of this 15 min visit spent in face to face counseling or/and coordination of care)   Dr. [name] M.D., FBeatrice Community HospitalC.P Pulmonary and Critical Care Medicine Staff Physician CU[facility] NyackPulmonary and Critical Care Pager: [phone] If no answer or between  15:00h - 7:00h: call [phone]  12/09/2016 4:54 PM

## 2016-12-09 NOTE — Patient Instructions (Signed)
IPF (idiopathic pulmonary fibrosis) (HCC) 9% decline overall in lung function (FVC/DLCO in 2 years  Plan Start esbriet per schedule - we discussed this   - do not forget to wear sunscreen  Followup 4 weeks with me or APP to do LFT blood test and monitor tolerance  Nodule of left lung 58m in LLL feb 2018  Plan CT chest wo contrast in auugust/sept 2018 - 6 month followup  Followup 4 weeks with APP Also, please have front desk xcheck your phone number by dialing

## 2016-12-13 ENCOUNTER — Ambulatory Visit: Payer: Medicare Other | Admitting: Internal Medicine

## 2016-12-23 ENCOUNTER — Other Ambulatory Visit: Payer: Self-pay | Admitting: Cardiovascular Disease

## 2016-12-23 DIAGNOSIS — I739 Peripheral vascular disease, unspecified: Secondary | ICD-10-CM

## 2016-12-26 ENCOUNTER — Telehealth: Payer: Self-pay | Admitting: Cardiovascular Disease

## 2016-12-26 NOTE — Telephone Encounter (Signed)
Patient calling the office for samples of medication:   1.  What medication and dosage are you requesting samples for? Xarelto 20 mg  2.  Are you currently out of this medication?

## 2016-12-26 NOTE — Telephone Encounter (Signed)
Medication samples have been provided to the patient.  Drug name: Xarelto 20 mg Qty: 28 tabs LOT: [id number] Exp.Date: 06/2019  Samples left at front desk for patient pick-up. Patient notified.  Emaree Chiu, [place] 4:51 PM 12/26/2016

## 2016-12-27 ENCOUNTER — Ambulatory Visit (HOSPITAL_COMMUNITY)
Admission: RE | Admit: 2016-12-27 | Discharge: 2016-12-27 | Disposition: A | Discharge status: Home / Self Care | Payer: Medicare Other | Source: Ambulatory Visit | Attending: Internal Medicine | Admitting: Internal Medicine

## 2016-12-27 DIAGNOSIS — I724 Aneurysm of artery of lower extremity: Secondary | ICD-10-CM | POA: Diagnosis not present

## 2016-12-27 DIAGNOSIS — Z951 Presence of aortocoronary bypass graft: Secondary | ICD-10-CM | POA: Insufficient documentation

## 2016-12-27 DIAGNOSIS — I714 Abdominal aortic aneurysm, without rupture, unspecified: Secondary | ICD-10-CM

## 2016-12-27 DIAGNOSIS — I739 Peripheral vascular disease, unspecified: Secondary | ICD-10-CM | POA: Insufficient documentation

## 2016-12-27 DIAGNOSIS — I2510 Atherosclerotic heart disease of native coronary artery without angina pectoris: Secondary | ICD-10-CM | POA: Diagnosis not present

## 2016-12-27 DIAGNOSIS — I723 Aneurysm of iliac artery: Secondary | ICD-10-CM | POA: Diagnosis not present

## 2016-12-27 DIAGNOSIS — R9439 Abnormal result of other cardiovascular function study: Secondary | ICD-10-CM | POA: Diagnosis not present

## 2016-12-27 DIAGNOSIS — I10 Essential (primary) hypertension: Secondary | ICD-10-CM | POA: Diagnosis not present

## 2016-12-27 DIAGNOSIS — I743 Embolism and thrombosis of arteries of the lower extremities: Secondary | ICD-10-CM | POA: Diagnosis not present

## 2016-12-27 DIAGNOSIS — Z87891 Personal history of nicotine dependence: Secondary | ICD-10-CM | POA: Diagnosis not present

## 2016-12-27 DIAGNOSIS — E785 Hyperlipidemia, unspecified: Secondary | ICD-10-CM | POA: Insufficient documentation

## 2017-01-03 ENCOUNTER — Ambulatory Visit (INDEPENDENT_AMBULATORY_CARE_PROVIDER_SITE_OTHER): Payer: Medicare Other | Admitting: Cardiovascular Disease

## 2017-01-03 ENCOUNTER — Encounter: Payer: Self-pay | Admitting: Cardiovascular Disease

## 2017-01-03 ENCOUNTER — Telehealth: Payer: Self-pay | Admitting: Adult Health

## 2017-01-03 VITALS — BP 122/64 | HR 48 | Ht 72.0 in | Wt 206.6 lb

## 2017-01-03 DIAGNOSIS — I739 Peripheral vascular disease, unspecified: Secondary | ICD-10-CM

## 2017-01-03 DIAGNOSIS — I724 Aneurysm of artery of lower extremity: Secondary | ICD-10-CM

## 2017-01-03 DIAGNOSIS — I2510 Atherosclerotic heart disease of native coronary artery without angina pectoris: Secondary | ICD-10-CM

## 2017-01-03 NOTE — Patient Instructions (Signed)
Medication Instructions: Your physician recommends that you continue on your current medications as directed. Please refer to the Current Medication list given to you today.   Follow-Up: You have been referred to Dr. Trula Slade at [facility] for surgical consideration.  --They will contact you to set up this appointment.  Your physician recommends that you schedule a follow-up appointment as needed with Dr. Gwenlyn Found.

## 2017-01-03 NOTE — Assessment & Plan Note (Signed)
[name] was referred to me by Dr. 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 He has had a right fem- tip bypass graft in 1996 in [place].He really denies significant claudication. And then refer him to Dr. Trula Slade for surgical consideration.

## 2017-01-03 NOTE — Telephone Encounter (Signed)
Left message for patient to call back to reschedule his appt with TP on 01/06/17

## 2017-01-03 NOTE — Progress Notes (Signed)
01/03/2017 Troy Miller   Apr 25, 1941  [id number]  Primary Physician [name], PA-C Primary Cardiologist: Lorretta Harp MD Renae Gloss  HPI:  Troy Miller is a 76 year old Caucasian male patient of Troy Miller with history of CAD status post remote bypass graft surgery in 1994 and redo in 2005. He has a history of vascular disease status post  Remote fem- tip bypass grafting on the right in 1996 in [place] and hyperlipidemia. He really denies claudication. He has a small abdominal aortic aneurysm As Well as hypertension and hyperlipidemia. He denies chest pain or shortness of breath. Recent Dopplers revealed abdominal aorta that measures 3.6 cm, a left iliac aneurysm as well as a distal left SFA and popliteal aneurysm. This attempted bypass graft appeared patent.   Current Outpatient Prescriptions  Medication Sig Dispense Refill  . aspirin 81 MG tablet Take 81 mg by mouth daily.    . calcium carbonate (OS-CAL) 600 MG TABS Take 600 mg by mouth 2 (two) times daily with a meal.    . cholecalciferol (VITAMIN D) 1000 UNITS tablet Take 1,000 Units by mouth 2 (two) times daily.    . famotidine (PEPCID) 20 MG tablet One at bedtime 90 tablet 0  . folic acid (FOLVITE) 1 MG tablet Take 1 mg by mouth daily.    . niacin (NIASPAN) 500 MG CR tablet Take 500 mg by mouth once. Takes 4 tabs at bedtime    . pantoprazole (PROTONIX) 40 MG tablet Take 1 tablet (40 mg total) by mouth daily. Take 30-60 min before first meal of the day 90 tablet 0  . Pirfenidone (ESBRIET) 267 MG CAPS Take 3 capsules by mouth 3 (three) times daily. 270 capsule 6  . rivaroxaban (XARELTO) 20 MG TABS tablet Take 1 tablet (20 mg total) by mouth every morning. 30 tablet 1  . simvastatin (ZOCOR) 20 MG tablet TAKE 1 TABLET BY MOUTH EVERY EVENING 30 tablet 6  . valsartan-hydrochlorothiazide (DIOVAN-HCT) 80-12.5 MG tablet TAKE 1 TABLET BY MOUTH DAILY. 30 tablet 6   No current facility-administered  medications for this visit.     Allergies  Allergen Reactions  . Fentanyl And Related Other (See Comments)    Hallucinations    Social History   Social History  . Marital status: Single    Spouse name: N/A  . Number of children: N/A  . Years of education: N/A   Occupational History  . Not on file.   Social History Main Topics  . Smoking status: Former Smoker    Packs/day: 2.00    Years: 25.00    Types: Cigarettes    Quit date: 04/26/1986  . Smokeless tobacco: Never Used  . Alcohol use No  . Drug use: No  . Sexual activity: Not on file   Other Topics Concern  . Not on file   Social History Narrative  . No narrative on file     Review of Systems: General: negative for chills, fever, night sweats or weight changes.  Cardiovascular: negative for chest pain, dyspnea on exertion, edema, orthopnea, palpitations, paroxysmal nocturnal dyspnea or shortness of breath Dermatological: negative for rash Respiratory: negative for cough or wheezing Urologic: negative for hematuria Abdominal: negative for nausea, vomiting, diarrhea, bright red blood per rectum, melena, or hematemesis Neurologic: negative for visual changes, syncope, or dizziness All other systems reviewed and are otherwise negative except as noted above.    Blood pressure 122/64, pulse (!) 48, height 6' (1.829 m), weight 206 lb  9.6 oz (93.7 kg).  General appearance: alert and no distress Neck: no adenopathy, no carotid bruit, no JVD, supple, symmetrical, trachea midline and thyroid not enlarged, symmetric, no tenderness/mass/nodules Lungs: clear to auscultation bilaterally Heart: regular rate and rhythm, S1, S2 normal, no murmur, click, rub or gallop Extremities: extremities normal, atraumatic, no cyanosis or edema  EKG Sinus bradycardia 48 with right bundle-branch block and left axis deviation. I personally reviewed this EKG.  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 He has had a right fem- tip bypass graft in 1996 in [place].He really denies significant claudication. And then refer him to Troy Miller for surgical consideration.      Lorretta Harp MD FACP,FACC,FAHA, [facility] 01/03/2017 10:41 AM

## 2017-01-03 NOTE — Addendum Note (Signed)
Addended by: Zebedee Iba on: 01/03/2017 04:25 PM   Modules accepted: Orders

## 2017-01-06 ENCOUNTER — Ambulatory Visit: Payer: Medicare Other | Admitting: Adult Health

## 2017-01-07 ENCOUNTER — Ambulatory Visit: Payer: Medicare Other | Admitting: Pulmonary Disease

## 2017-01-13 ENCOUNTER — Encounter: Payer: Self-pay | Admitting: *Deleted

## 2017-01-15 ENCOUNTER — Encounter: Payer: Self-pay | Admitting: Surgery

## 2017-01-21 ENCOUNTER — Other Ambulatory Visit (INDEPENDENT_AMBULATORY_CARE_PROVIDER_SITE_OTHER): Payer: Medicare Other

## 2017-01-21 ENCOUNTER — Ambulatory Visit (INDEPENDENT_AMBULATORY_CARE_PROVIDER_SITE_OTHER): Payer: Medicare Other | Admitting: Pulmonary Disease

## 2017-01-21 ENCOUNTER — Encounter: Payer: Self-pay | Admitting: Pulmonary Disease

## 2017-01-21 VITALS — BP 126/62 | HR 76 | Ht 72.0 in | Wt 207.0 lb

## 2017-01-21 DIAGNOSIS — I2510 Atherosclerotic heart disease of native coronary artery without angina pectoris: Secondary | ICD-10-CM

## 2017-01-21 DIAGNOSIS — R911 Solitary pulmonary nodule: Secondary | ICD-10-CM

## 2017-01-21 DIAGNOSIS — J84112 Idiopathic pulmonary fibrosis: Secondary | ICD-10-CM

## 2017-01-21 LAB — CBC WITH DIFFERENTIAL/PLATELET
BASOS ABS: 0.1 10*3/uL (ref 0.0–0.1)
Basophils Relative: 1.1 % (ref 0.0–3.0)
EOS ABS: 0.4 10*3/uL (ref 0.0–0.7)
Eosinophils Relative: 3.7 % (ref 0.0–5.0)
HCT: 45.7 % (ref 39.0–52.0)
HEMOGLOBIN: 15.5 g/dL (ref 13.0–17.0)
Lymphocytes Relative: 24 % (ref 12.0–46.0)
Lymphs Abs: 2.3 10*3/uL (ref 0.7–4.0)
MCHC: 33.9 g/dL (ref 30.0–36.0)
MCV: 91.1 fl (ref 78.0–100.0)
Monocytes Absolute: 1 10*3/uL (ref 0.1–1.0)
Monocytes Relative: 10 % (ref 3.0–12.0)
Neutro Abs: 6 10*3/uL (ref 1.4–7.7)
Neutrophils Relative %: 61.2 % (ref 43.0–77.0)
Platelets: 194 10*3/uL (ref 150.0–400.0)
RBC: 5.01 Mil/uL (ref 4.22–5.81)
RDW: 15.9 % — ABNORMAL HIGH (ref 11.5–15.5)
WBC: 9.8 10*3/uL (ref 4.0–10.5)

## 2017-01-21 LAB — COMPREHENSIVE METABOLIC PANEL
ALBUMIN: 4.6 g/dL (ref 3.5–5.2)
ALT: 8 U/L (ref 0–53)
AST: 14 U/L (ref 0–37)
Alkaline Phosphatase: 57 U/L (ref 39–117)
BILIRUBIN TOTAL: 0.6 mg/dL (ref 0.2–1.2)
BUN: 23 mg/dL (ref 6–23)
CALCIUM: 9.9 mg/dL (ref 8.4–10.5)
CHLORIDE: 103 meq/L (ref 96–112)
CO2: 23 mEq/L (ref 19–32)
CREATININE: 1.23 mg/dL (ref 0.40–1.50)
GFR: 60.49 mL/min (ref >60.00)
Glucose, Bld: 118 mg/dL — ABNORMAL HIGH (ref 70–99)
Potassium: 4 mEq/L (ref 3.5–5.1)
Sodium: 136 mEq/L (ref 135–145)
Total Protein: 7.6 g/dL (ref 6.0–8.3)

## 2017-01-21 NOTE — Patient Instructions (Signed)
We will get comprehensive metabolic panel and CBC for monitoring Continue esbriet at current dose  Follow up in 1 month.

## 2017-01-21 NOTE — Progress Notes (Addendum)
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 Started on Esbriet 2 months ago. He is a patient of Dr. Chase Caller. He is tolerating the esbriet well. He developed mild headaches which have improved. Denies any GI symptoms with nausea, vomiting, indigestion. He is remembers to use sunscreen and avoid sun exposure. Denies cough, sputum production, fevers, chills, increased dyspnea.   Outpatient Encounter Prescriptions as of 01/21/2017  Medication Sig  . aspirin 81 MG tablet Take 81 mg by mouth daily.  . calcium carbonate (OS-CAL) 600 MG TABS Take 600 mg by mouth 2 (two) times daily with a meal.  . cholecalciferol (VITAMIN D) 1000 UNITS tablet Take 1,000 Units by mouth 2 (two) times daily.  . famotidine (PEPCID) 20 MG tablet One at bedtime  . folic acid (FOLVITE) 1 MG tablet Take 1 mg by mouth daily.  . niacin (NIASPAN) 500 MG CR tablet Take 500 mg by mouth once. Takes 4 tabs at bedtime  . pantoprazole (PROTONIX) 40 MG tablet Take 1 tablet (40 mg total) by mouth daily. Take 30-60 min before first meal of the day  . Pirfenidone (ESBRIET) 267 MG CAPS Take 3 capsules by mouth 3 (three) times daily.  . rivaroxaban (XARELTO) 20 MG TABS tablet Take 1 tablet (20 mg total) by mouth every morning.  . simvastatin (ZOCOR) 20 MG tablet TAKE 1 TABLET BY MOUTH EVERY EVENING  . valsartan-hydrochlorothiazide (DIOVAN-HCT) 80-12.5 MG tablet TAKE 1 TABLET BY MOUTH DAILY.   No facility-administered encounter medications on file as of 01/21/2017.     Allergies as of 01/21/2017 - Review Complete 01/21/2017  Allergen Reaction Noted  . Fentanyl and related Other (See Comments) 08/31/2012    Past Medical History:  Diagnosis Date  . AAA (abdominal aortic aneurysm) ([place])   . Aortic  stenosis, mild    07/10/12 EF 50-55% on echo-mitral annular ca+ also  . Atrial fibrillation ([place])    03/19/10 ablation-[name]  . Atrial flutter ([place])   . CAD (coronary artery disease)   . History of stress test    Myoview 8/16:  EF 47%, inferior scar, no ischemia, Intermediate risk (low EF)  . Hyperlipemia   . Hypertension   . Iliac artery aneurysm ([place])   . PVD (peripheral vascular disease) ([place])    remote right fem-tib bypass graft sugery  . 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 BACK SURGERY  2003  . CARDIAC ELECTROPHYSIOLOGY STUDY AND ABLATION  03/19/10  . CARDIOVERSION  08/16/08  . CHOLECYSTECTOMY  08/13/02  . CORONARY ARTERY BYPASS GRAFT  1994 & 2005  . FEMORAL-TIBIAL BYPASS GRAFT     Remote  . HERNIA REPAIR  06/03/07  . VASECTOMY  01/28/08    Family History  Problem Relation Age of Onset  . Lung cancer Mother        smoked  . Heart failure Father   . Stroke Sister     Social History   Social History  . Marital status: Single    Spouse name: N/A  . Number of children: N/A  . Years of education: N/A   Occupational History  . Not on file.   Social History Main  Topics  . Smoking status: Former Smoker    Packs/day: 2.00    Years: 25.00    Types: Cigarettes    Quit date: 04/26/1986  . Smokeless tobacco: Never Used  . Alcohol use No  . Drug use: No  . Sexual activity: Not on file   Other Topics Concern  . Not on file   Social History Narrative  . No narrative on file    Review of systems: Review of Systems  Constitutional: Negative for fever and chills.  HENT: Negative.   Eyes: Negative for blurred vision.  Respiratory: as per HPI  Cardiovascular: Negative for chest pain and palpitations.  Gastrointestinal: Negative for vomiting, diarrhea, blood per rectum. Genitourinary: Negative for dysuria, urgency, frequency and hematuria.  Musculoskeletal: Negative for myalgias,  back pain and joint pain.  Skin: Negative for itching and rash.  Neurological: Negative for dizziness, tremors, focal weakness, seizures and loss of consciousness.  Endo/Heme/Allergies: Negative for environmental allergies.  Psychiatric/Behavioral: Negative for depression, suicidal ideas and hallucinations.  All other systems reviewed and are negative.  Physical Exam: Blood pressure 126/62, pulse 76, height 6' (1.829 m), weight 207 lb (93.9 kg), SpO2 92 %. Gen:      No acute distress HEENT:  EOMI, sclera anicteric Neck:     No masses; no thyromegaly Lungs:    Basal crackles; normal respiratory effort CV:         Regular rate and rhythm; no murmurs Abd:      + bowel sounds; soft, non-tender; no palpable masses, no distension Ext:    No edema; adequate peripheral perfusion Skin:      Warm and dry; no rash Neuro: alert and oriented x 3 Psych: normal mood and affect  Data Reviewed: PFTs 6/4/1 8 FVC 4.29 [99%) FEV1 3.08 [94%) F/F 72 SVC 81% DLCO 34% Severe diffusion defect   CT chest 08/09/16-  calcified pleural plaques, peripheral, basal fibrosis with bronchiolectasis, early honeycombing. I have reviewed the images personally  Assessment:  IPF fibrosis Asbestos exposure Started on esbriet due to progressive worsening of PFTs and fibrosis on CT scan. He has been on therapy for 2 months and is tolerating it well. No GI symptoms. He's had some mild headache which is improving.  He'll continue on the antifibrotic therapy, check comprehensive metabolic panel, CBC today.  Plan/Recommendations: - Follow up monthly for first 6 months. Check CMP, CBC today - Return in 1 month - Continue esbriet.   Marshell Garfinkel MD [facility] Pager [phone] 01/21/2017, 1:35 PM  CC: [name], PA-C

## 2017-01-28 ENCOUNTER — Telehealth: Payer: Self-pay | Admitting: Cardiovascular Disease

## 2017-01-28 NOTE — Telephone Encounter (Signed)
Samples left at front for pt and patient notified

## 2017-01-28 NOTE — Telephone Encounter (Signed)
New message     Patient calling the office for samples of medication:   1.  What medication and dosage are you requesting samples for? 20 mg Xarelto  2.  Are you currently out of this medication?  Has 2 doses left

## 2017-01-29 ENCOUNTER — Encounter: Payer: Self-pay | Admitting: Surgery

## 2017-01-29 ENCOUNTER — Ambulatory Visit (INDEPENDENT_AMBULATORY_CARE_PROVIDER_SITE_OTHER): Payer: Medicare Other | Admitting: Surgery

## 2017-01-29 VITALS — BP 125/70 | HR 59 | Temp 97.4°F | Resp 20 | Ht 72.0 in | Wt 205.8 lb

## 2017-01-29 DIAGNOSIS — I714 Abdominal aortic aneurysm, without rupture, unspecified: Secondary | ICD-10-CM

## 2017-01-29 DIAGNOSIS — I2510 Atherosclerotic heart disease of native coronary artery without angina pectoris: Secondary | ICD-10-CM

## 2017-01-29 DIAGNOSIS — I724 Aneurysm of artery of lower extremity: Secondary | ICD-10-CM | POA: Diagnosis not present

## 2017-01-29 NOTE — Progress Notes (Signed)
Vascular and Vein Specialist of [facility]  Patient name: Troy Miller MRN: [id number] DOB: [birth year] Sex: male  REASON FOR VISIT: AAA, left popliteal aneurysm, anastomotic aneurysm right femoral artery; requesting provider: Dr. Gwenlyn Found  HPI:    Troy Miller is a 76 y.o. male, who is here to discuss multiple issues. He previously underwent right femoral to tibial artery bypass in 1996 in [place] for claudication. His most recent doppler studies in June 2018 revealed an aneurysm at the right proximal anastomosis of his right femoral tibial bypass. His bypass was patent. His ABI is 1.1 on that side. He denies any claudication issues, rest pain or non healing wounds.  His duplex studies also revealed a left distal SFA/popliteal aneurysm measuring ~3 cm. He has no evidence of thromboembolic events to his left foot.   His AAA has been followed in the past by Dr. Claiborne Billings who is his main cardiology. This last measured 3.6 cm. He denies any family history of aneurysms.   He has a history of CABG in 1994 and redo CABG in 2005. His bilateral great saphenous veins have been used for bypass. He is on Xarelto for atrial flutter. He is on a statin for hyperlipidemia. He is on an ARB for hypertension. He is on home 02 at night. He is a former smoker quitting over 30 years ago. He denies any history of CVA. He denies any recent cardiac issues. Has history of DVT.   PAST MEDICAL HISTORY:   Past Medical History:  Diagnosis Date  . AAA (abdominal aortic aneurysm) ([place])   . Aortic stenosis, mild    07/10/12 EF 50-55% on echo-mitral annular ca+ also  . Atrial fibrillation ([place])    03/19/10 ablation-[name]  . Atrial flutter ([place])   . CAD (coronary artery disease)   . DVT (deep venous thrombosis) ([place])   . History of stress test    Myoview 8/16:  EF 47%, inferior scar, no ischemia, Intermediate risk (low EF)  . Hyperlipemia   . Hypertension   . Iliac artery aneurysm ([place])   . PVD  (peripheral vascular disease) ([place])    remote right fem-tib bypass graft sugery  . 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 Lung cancer Mother        smoked  . Heart failure Father   . Stroke Sister     SOCIAL HISTORY:   Social History   Social History  . Marital status: Single    Spouse name: N/A  . Number of children: N/A  . Years of education: N/A   Occupational History  . Not on file.   Social History Main Topics  . Smoking status: Former Smoker    Packs/day: 2.00    Years: 25.00    Types: Cigarettes    Quit date: 04/26/1986  . Smokeless tobacco: Never Used  . Alcohol use No  . Drug use: No  . Sexual activity: Not on file   Other Topics Concern  . Not on file   Social History Narrative  . No narrative on file    Allergies  Allergen Reactions  . Fentanyl And Related Other (See Comments)    Hallucinations    Current Outpatient Prescriptions  Medication Sig Dispense Refill  . aspirin 81 MG tablet Take 81 mg by mouth daily.    . calcium carbonate (OS-CAL) 600 MG TABS Take 600 mg by mouth 2 (two) times  daily with a meal.    . cholecalciferol (VITAMIN D) 1000 UNITS tablet Take 1,000 Units by mouth 2 (two) times daily.    . famotidine (PEPCID) 20 MG tablet One at bedtime 90 tablet 0  . folic acid (FOLVITE) 1 MG tablet Take 1 mg by mouth daily.    . niacin (NIASPAN) 500 MG CR tablet Take 500 mg by mouth once. Takes 4 tabs at bedtime    . pantoprazole (PROTONIX) 40 MG tablet Take 1 tablet (40 mg total) by mouth daily. Take 30-60 min before first meal of the day 90 tablet 0  . Pirfenidone (ESBRIET) 267 MG CAPS Take 3 capsules by mouth 3 (three) times daily. 270 capsule 6  . rivaroxaban (XARELTO) 20 MG TABS tablet Take 1 tablet (20 mg total) by mouth every morning. 30 tablet 1  . simvastatin (ZOCOR) 20 MG tablet TAKE 1 TABLET BY MOUTH EVERY EVENING 30 tablet 6  . valsartan-hydrochlorothiazide  (DIOVAN-HCT) 80-12.5 MG tablet TAKE 1 TABLET BY MOUTH DAILY. 30 tablet 6   No current facility-administered medications for this visit.     REVIEW OF SYSTEMS:   REVIEW OF SYSTEMS (negative unless checked):   Cardiac:  _0  Chest pain or chest pressure? _1  Shortness of breath upon activity? _2  Shortness of breath when lying flat? _3  Irregular heart rhythm?  Vascular:  _4  Pain in calf, thigh, or hip brought on by walking? _5  Pain in feet at night that wakes you up from your sleep? _6  Blood clot in your veins? _7  Leg swelling?  Pulmonary:  _8  Oxygen at home? _9  Productive cough? _10  Wheezing?  Neurologic:  _11  Sudden weakness in arms or legs? _12  Sudden numbness in arms or legs? _13  Sudden onset of difficult speaking or slurred speech? _14  Temporary loss of vision in one eye? _15  Problems with dizziness?  Gastrointestinal:  _16  Blood in stool? _17  Vomited blood?  Genitourinary:  _18  Burning when urinating? _19  Blood in urine?  Psychiatric:  _20  Major depression  Hematologic:  _21  Bleeding problems? _22  Problems with blood clotting?  Dermatologic:  _23  Rashes or ulcers?  Constitutional:  _24  Fever or chills?  Ear/Nose/Throat:  _25  Change in hearing? _26  Nose bleeds? _27  Sore throat?  Musculoskeletal:  _28  Back pain? _29  Joint pain? _30  Muscle pain?  PHYSICAL EXAM:   Vitals:   01/29/17 1551  BP: 125/70  Pulse: (!) 59  Resp: 20  Temp: (!) 97.4 F (36.3 C)  TempSrc: Oral  SpO2: 90%  Weight: 205 lb 12.8 oz (93.4 kg)  Height: 6' (1.829 m)    GENERAL: The patient is a well-nourished male, in no acute distress. The vital signs are documented above. CARDIAC: There is a regular rate and rhythm. No carotid bruits.  VASCULAR: 2+ radial and femoral pulses bilaterally. Non palpable popliteal and pedal pulses. No ischemic changes to feet. Dilated tortuous varicosities to feet and legs bilaterally. Right leg is slightly more swollen than left.  PULMONARY: There is good  air exchange bilaterally without wheezing or rales. ABDOMEN: Soft and non-tender. No pulsatile mass.  MUSCULOSKELETAL: There are no major deformities or cyanosis. NEUROLOGIC: No focal weakness or paresthesias are detected. SKIN: There are no ulcers or rashes noted. PSYCHIATRIC: The patient has a normal affect.  DATA:    ABIs, AAA duplex and lower extremity arterial duplex studies reviewed from 12/27/16. His right femoral tibial bypass is patent. ABI of 1.1 on the right and 0.67 on the left. Left distal SFA is aneurysmal measuring 3 x 3 cm  and left popliteal measuring 2.3 x 2.4 cm. Aneurysm at right femoral proximal anastomosis 2.4 x 2.3 cm.   ASSESSMENT/PLAN:   3.6 cm AAA  Will continue to monitor with annual ultrasounds. No plans for intervention until his aneurysm reaches at least 5.0 cm or he is symptomatic.   Right femoral artery anastomotic aneurysm   His bypass is open. He denies any claudication issues. Will obtain CTA abd/pelvis with runoff to evaluate.  3 cm distal left SFA/popliteal aneurysm  No thromboembolic symptoms from aneurysm. Will need repair of this. Will obtain CTA abd/pelvis with runoff to determine possible stent exclusion of aneurysm versus surgical repair.   Of note, has had history of bilateral great saphenous harvest for CABG/leg bypass. On Xarelto for atrial flutter.   The patient will follow up with Dr. Trula Slade to discuss intervention plans following his CTA.   Virgina Jock, PA-C [facility] MD: Trula Slade  I agree with the above.  I have seen and evaluated the patient.  He comes with an ultrasound of his abdomen that shows a 3.6 cm infrarenal abdominal aortic aneurysm.  He also has aneurysmal changes at the proximal anastomosis of a right femoral distal bypass graft with vein.  Diameter ranges in the 2.6 cm area.  He also has a 3 cm left popliteal aneurysm.  He has had a CT scan of the chest in the recent  past which does not show aneurysmal changes.  In order to get adequate planning information, I feel he needs to have a CT angiogram with bilateral lower extremity runoff to evaluate the popliteal aneurysm on the left see if he is a candidate for endovascular repair or whether or not he needs surgical intervention.  He'll also giving information regarding the anastomotic changes on his right leg bypass, and a true diameter measurement of the infrarenal aorta.  He is going to get the CT scan in the immediate future and will follow-up with me as soon as this is done.  He is currently on anticoagulation.  Annamarie Major

## 2017-01-30 ENCOUNTER — Telehealth: Payer: Self-pay | Admitting: Surgery

## 2017-01-30 NOTE — Addendum Note (Signed)
Addended by: Lianne Cure A on: 01/30/2017 09:33 AM   Modules accepted: Orders

## 2017-01-30 NOTE — Telephone Encounter (Signed)
Left a voice message regarding the the CTA abd/pelvis ordered by Dr.Brabham which is scheduled for Monday 02/10/17 at 2:20pm at the [address] location of [facility]. The patient needs to arrive at 2pm with no solid foods 4 hours prior. Liquids and medications are fine. Then he is to see [facility] on the same day at 3:30pm. I also mailed a letter to the patient with the above information as well. awt

## 2017-02-07 ENCOUNTER — Encounter: Payer: Self-pay | Admitting: Surgery

## 2017-02-10 ENCOUNTER — Ambulatory Visit (INDEPENDENT_AMBULATORY_CARE_PROVIDER_SITE_OTHER): Payer: Medicare Other | Admitting: Surgery

## 2017-02-10 ENCOUNTER — Ambulatory Visit
Admission: RE | Admit: 2017-02-10 | Discharge: 2017-02-10 | Disposition: A | Discharge status: Home / Self Care | Payer: Medicare Other | Source: Ambulatory Visit | Attending: Surgery | Admitting: Surgery

## 2017-02-10 ENCOUNTER — Encounter: Payer: Self-pay | Admitting: Surgery

## 2017-02-10 VITALS — BP 132/76 | HR 51 | Temp 98.4°F | Resp 16 | Ht 72.0 in | Wt 206.0 lb

## 2017-02-10 DIAGNOSIS — I714 Abdominal aortic aneurysm, without rupture, unspecified: Secondary | ICD-10-CM

## 2017-02-10 DIAGNOSIS — I2510 Atherosclerotic heart disease of native coronary artery without angina pectoris: Secondary | ICD-10-CM

## 2017-02-10 DIAGNOSIS — I724 Aneurysm of artery of lower extremity: Secondary | ICD-10-CM

## 2017-02-10 MED ORDER — IOPAMIDOL (ISOVUE-370) INJECTION 76%
125.0000 mL | Freq: Once | INTRAVENOUS | Status: AC | PRN
  Administered 2017-02-10: 125 mL via INTRAVENOUS

## 2017-02-10 NOTE — Progress Notes (Signed)
Vascular and Vein Specialist of [facility]  Patient name: Troy Miller MRN: [id number] DOB: 12-28-1940 Sex: male   REASON FOR VISIT:    Follow up  HISOTRY OF PRESENT ILLNESS:    Troy Miller is a 76 y.o. male who is back today for discussions of his CT scan for further evaluation of his vascular disease.  He has a history of a right femoral to tibial bypass in 1996 in [place] for claudication.  He recently had Doppler studies that revealed an aneurysm at the right proximal anastomosis.  His ABIs 1.1.  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  There is also a 3.6 cm abdominal aortic aneurysm.  He has a history of CABG in 1994 and redo CABG in 2005. His bilateral great saphenous veins have been used for bypass. He is on Xarelto for atrial flutter. He is on a statin for hyperlipidemia. He is on an ARB for hypertension. He is on home 02 at night. He is a former smoker quitting over 30 years ago. He denies any history of CVA. He denies any recent cardiac issues. Has history of DVT.   PAST MEDICAL HISTORY:   Past Medical History:  Diagnosis Date  . AAA (abdominal aortic aneurysm) ([place])   . Aortic stenosis, mild    07/10/12 EF 50-55% on echo-mitral annular ca+ also  . Atrial fibrillation ([place])    03/19/10 ablation-[name]  . Atrial flutter ([place])   . CAD (coronary artery disease)   . DVT (deep venous thrombosis) ([place])   . History of stress test    Myoview 8/16:  EF 47%, inferior scar, no ischemia, Intermediate risk (low EF)  . Hyperlipemia   . Hypertension   . Iliac artery aneurysm ([place])   . PVD (peripheral vascular disease) ([place])    remote right fem-tib bypass graft sugery  . 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 Lung cancer Mother        smoked  . Heart failure Father   . Stroke Sister     SOCIAL  HISTORY:   Social History  Substance Use Topics  . Smoking status: Former Smoker    Packs/day: 2.00    Years: 25.00    Types: Cigarettes    Quit date: 04/26/1986  . Smokeless tobacco: Never Used  . Alcohol use No     ALLERGIES:   Allergies  Allergen Reactions  . Fentanyl And Related Other (See Comments)    Hallucinations     CURRENT MEDICATIONS:   Current Outpatient Prescriptions  Medication Sig Dispense Refill  . aspirin 81 MG tablet Take 81 mg by mouth daily.    . calcium carbonate (OS-CAL) 600 MG TABS Take 600 mg by mouth 2 (two) times daily with a meal.    . cholecalciferol (VITAMIN D) 1000 UNITS tablet Take 1,000 Units by mouth 2 (two) times daily.    . famotidine (PEPCID) 20 MG tablet One at bedtime 90 tablet 0  . folic acid (FOLVITE) 1 MG tablet Take 1 mg by mouth daily.    . niacin (NIASPAN) 500 MG CR tablet Take 500 mg by mouth once. Takes 4 tabs at bedtime    . pantoprazole (PROTONIX) 40 MG tablet Take 1 tablet (40 mg total) by mouth daily. Take 30-60 min before first meal of the day 90 tablet 0  . Pirfenidone (ESBRIET)  267 MG CAPS Take 3 capsules by mouth 3 (three) times daily. 270 capsule 6  . rivaroxaban (XARELTO) 20 MG TABS tablet Take 1 tablet (20 mg total) by mouth every morning. 30 tablet 1  . simvastatin (ZOCOR) 20 MG tablet TAKE 1 TABLET BY MOUTH EVERY EVENING 30 tablet 6  . valsartan-hydrochlorothiazide (DIOVAN-HCT) 80-12.5 MG tablet TAKE 1 TABLET BY MOUTH DAILY. 30 tablet 6   No current facility-administered medications for this visit.     REVIEW OF SYSTEMS:   _0  denotes positive finding, _1  denotes negative finding Cardiac  Comments:  Chest pain or chest xpressure:    Shortness of breath upon exertion: x   Short of breath when lying flat:    Irregular heart rhythm: x       Vascular    Pain in calf, thigh, or hip brought on by ambulation:    Pain in feet at night that wakes you up from your sleep:     Blood clot in your veins: x   Leg  swelling:  x       Pulmonary    Oxygen at home: x   Productive cough:     Wheezing:         Neurologic    Sudden weakness in arms or legs:     Sudden numbness in arms or legs:     Sudden onset of difficulty speaking or slurred speech:    Temporary loss of vision in one eye:     Problems with dizziness:         Gastrointestinal    Blood in stool:     Vomited blood:         Genitourinary    Burning when urinating:     Blood in urine:        Psychiatric    Major depression:         Hematologic    Bleeding problems:    Problems with blood clotting too easily:        Skin    Rashes or ulcers:        Constitutional    Fever or chills:      PHYSICAL EXAM:   Vitals:   02/10/17 1541  BP: 132/76  Pulse: (!) 51  Resp: 16  Temp: 98.4 F (36.9 C)  TempSrc: Oral  SpO2: 93%  Weight: 206 lb (93.4 kg)  Height: 6' (1.829 m)    GENERAL: The patient is a well-nourished male, in no acute distress. The vital signs are documented above. CARDIAC: There is a regular rate and rhythm.  VASCULAR: Palpable right graft pulse.  Multiple varicosities in both lower extremities.  Palpable femoral pulses pedal pulses are not palpable PULMONARY: Non-labored respirations ABDOMEN: Soft and non-tender with normal pitched bowel sounds.  MUSCULOSKELETAL: There are no major deformities or cyanosis. NEUROLOGIC: No focal weakness or paresthesias are detected. SKIN: There are no ulcers or rashes noted. PSYCHIATRIC: The patient has a normal affect.  STUDIES:   Outside duplex: ABIs, AAA duplex and lower extremity arterial duplex studies reviewed from 12/27/16. His right femoral tibial bypass is patent. ABI of 1.1 on the right and 0.67 on the left. Left distal SFA is aneurysmal measuring 3 x 3 cm and left popliteal measuring 2.3 x 2.4 cm. Aneurysm at right femoral proximal anastomosis 2.4 x 2.3 cm.   I have reviewed his CT angiogram of the abdomen and pelvis with bilateral runoff which has not been  officially read.  The AAA measures  approximate 4 cm.  He has roughly a 3 cm left common iliac aneurysm.  I did not see aneurysmal changes to the right distal superficial femoral to posterior tibial bypass.  There is all below the aneurysm and the left superficial femoral and popliteal artery ending just about the level of the joint space.  It was difficult to determine runoff based on tibial calcification  MEDICAL ISSUES:   AAA/left CIA: This will need to be addressed at a later date.  I'll continue to monitor these.  He will need a CT scan at least in 6 months if not sooner.  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  If the patient's runoff is not healthy, I would need to consider femoral popliteal bypass graft, however saphenous veins had been removed for CABG and lower sternum a bypass.  He would need to have vein mapping of his small saphenous and upper extremities.  I have scheduled him for arteriogram on Tuesday, August 14.  I'll plan on accessing the right groin getting an abdominal aortogram with bilateral runoff and intervening on the left popliteal artery if feasible.He will be off his Jennye Moccasin for 48 hours prior to the procedure   Annamarie Major, MD [facility] and [facility]in Specialists of [facility] [phone] Pager [phone]

## 2017-02-11 ENCOUNTER — Telehealth: Payer: Self-pay | Admitting: Pulmonary Disease

## 2017-02-11 ENCOUNTER — Other Ambulatory Visit: Payer: Self-pay | Admitting: Internal Medicine

## 2017-02-11 NOTE — Telephone Encounter (Signed)
Spoke with pt. He is aware of results. Nothing further was needed. 

## 2017-02-11 NOTE — Telephone Encounter (Signed)
Patient returned phone call..ert ° °

## 2017-02-11 NOTE — Telephone Encounter (Signed)
Notes recorded by Marshell Garfinkel, MD on 01/25/2017 at 9:13 AM EDT Labs are ok. Continue current therapy -------------------------- lmtcb x1 for pt. He has a [id number] area code and will need to speak to someone when he calls back.

## 2017-02-13 ENCOUNTER — Other Ambulatory Visit: Payer: Self-pay

## 2017-02-14 ENCOUNTER — Encounter: Payer: Self-pay | Admitting: Pharmacist

## 2017-02-18 ENCOUNTER — Ambulatory Visit: Payer: Medicare Other | Admitting: Pulmonary Disease

## 2017-02-18 ENCOUNTER — Ambulatory Visit (HOSPITAL_COMMUNITY)
Admission: RE | Admit: 2017-02-18 | Discharge: 2017-02-18 | Disposition: A | Discharge status: Home / Self Care | Payer: Medicare Other | Source: Ambulatory Visit | Attending: Surgery | Admitting: Surgery

## 2017-02-18 ENCOUNTER — Encounter (HOSPITAL_COMMUNITY)
Admission: RE | Disposition: A | Discharge status: Home / Self Care | Payer: Self-pay | Source: Ambulatory Visit | Attending: Surgery

## 2017-02-18 DIAGNOSIS — I4892 Unspecified atrial flutter: Secondary | ICD-10-CM | POA: Insufficient documentation

## 2017-02-18 DIAGNOSIS — Z86718 Personal history of other venous thrombosis and embolism: Secondary | ICD-10-CM | POA: Insufficient documentation

## 2017-02-18 DIAGNOSIS — Z951 Presence of aortocoronary bypass graft: Secondary | ICD-10-CM | POA: Insufficient documentation

## 2017-02-18 DIAGNOSIS — Z87891 Personal history of nicotine dependence: Secondary | ICD-10-CM | POA: Insufficient documentation

## 2017-02-18 DIAGNOSIS — I771 Stricture of artery: Secondary | ICD-10-CM | POA: Diagnosis not present

## 2017-02-18 DIAGNOSIS — Z79899 Other long term (current) drug therapy: Secondary | ICD-10-CM | POA: Diagnosis not present

## 2017-02-18 DIAGNOSIS — I10 Essential (primary) hypertension: Secondary | ICD-10-CM | POA: Insufficient documentation

## 2017-02-18 DIAGNOSIS — I724 Aneurysm of artery of lower extremity: Secondary | ICD-10-CM | POA: Insufficient documentation

## 2017-02-18 DIAGNOSIS — I2510 Atherosclerotic heart disease of native coronary artery without angina pectoris: Secondary | ICD-10-CM | POA: Insufficient documentation

## 2017-02-18 DIAGNOSIS — Z7901 Long term (current) use of anticoagulants: Secondary | ICD-10-CM | POA: Diagnosis not present

## 2017-02-18 DIAGNOSIS — Z7982 Long term (current) use of aspirin: Secondary | ICD-10-CM | POA: Insufficient documentation

## 2017-02-18 DIAGNOSIS — E785 Hyperlipidemia, unspecified: Secondary | ICD-10-CM | POA: Insufficient documentation

## 2017-02-18 DIAGNOSIS — I350 Nonrheumatic aortic (valve) stenosis: Secondary | ICD-10-CM | POA: Diagnosis not present

## 2017-02-18 HISTORY — PX: ABDOMINAL AORTOGRAM W/LOWER EXTREMITY: CATH118223

## 2017-02-18 HISTORY — PX: PERIPHERAL VASCULAR INTERVENTION: CATH118257

## 2017-02-18 LAB — POCT I-STAT, CHEM 8
BUN: 18 mg/dL (ref 6–20)
CALCIUM ION: 1.19 mmol/L (ref 1.15–1.40)
CHLORIDE: 105 mmol/L (ref 101–111)
Creatinine, Ser: 1.2 mg/dL (ref 0.61–1.24)
Glucose, Bld: 112 mg/dL — ABNORMAL HIGH (ref 65–99)
HCT: 49 % (ref 39.0–52.0)
Hemoglobin: 16.7 g/dL (ref 13.0–17.0)
POTASSIUM: 4.5 mmol/L (ref 3.5–5.1)
SODIUM: 140 mmol/L (ref 135–145)
TCO2: 26 mmol/L (ref 0–100)

## 2017-02-18 LAB — POCT ACTIVATED CLOTTING TIME
ACTIVATED CLOTTING TIME: 246 s
ACTIVATED CLOTTING TIME: 191 s
ACTIVATED CLOTTING TIME: 169 s
Activated Clotting Time: 213 seconds

## 2017-02-18 SURGERY — ABDOMINAL AORTOGRAM W/LOWER EXTREMITY
Anesthesia: LOCAL

## 2017-02-18 MED ORDER — SODIUM CHLORIDE 0.9 % IV SOLN
1.0000 mL/kg/h | INTRAVENOUS | Status: DC
  Administered 2017-02-18: 1 mL/kg/h via INTRAVENOUS

## 2017-02-18 MED ORDER — IODIXANOL 320 MG/ML IV SOLN
INTRAVENOUS | Status: DC | PRN
  Administered 2017-02-18: 155 mL via INTRA_ARTERIAL

## 2017-02-18 MED ORDER — MIDAZOLAM HCL 2 MG/2ML IJ SOLN
INTRAMUSCULAR | Status: AC
  Filled 2017-02-18: qty 2

## 2017-02-18 MED ORDER — HYDRALAZINE HCL 20 MG/ML IJ SOLN: 5.0000 mg | INTRAMUSCULAR | Status: DC | PRN

## 2017-02-18 MED ORDER — ACETAMINOPHEN 325 MG RE SUPP: 325.0000 mg | RECTAL | Status: DC | PRN

## 2017-02-18 MED ORDER — LABETALOL HCL 5 MG/ML IV SOLN: 10.0000 mg | INTRAVENOUS | Status: DC | PRN

## 2017-02-18 MED ORDER — MORPHINE SULFATE (PF) 10 MG/ML IV SOLN
INTRAVENOUS | Status: DC | PRN
  Administered 2017-02-18 (×2): 2 mg via INTRAVENOUS

## 2017-02-18 MED ORDER — MIDAZOLAM HCL 2 MG/2ML IJ SOLN
INTRAMUSCULAR | Status: DC | PRN
  Administered 2017-02-18 (×2): 1 mg via INTRAVENOUS

## 2017-02-18 MED ORDER — GUAIFENESIN-DM 100-10 MG/5ML PO SYRP: 15.0000 mL | ORAL_SOLUTION | ORAL | Status: DC | PRN

## 2017-02-18 MED ORDER — METOPROLOL TARTRATE 5 MG/5ML IV SOLN: 2.0000 mg | INTRAVENOUS | Status: DC | PRN

## 2017-02-18 MED ORDER — PHENOL 1.4 % MT LIQD: 1.0000 | OROMUCOSAL | Status: DC | PRN

## 2017-02-18 MED ORDER — ACETAMINOPHEN 325 MG PO TABS: 325.0000 mg | ORAL_TABLET | ORAL | Status: DC | PRN

## 2017-02-18 MED ORDER — HEPARIN SODIUM (PORCINE) 1000 UNIT/ML IJ SOLN
INTRAMUSCULAR | Status: DC | PRN
  Administered 2017-02-18: 9000 [IU] via INTRAVENOUS
  Administered 2017-02-18: 1000 [IU] via INTRAVENOUS

## 2017-02-18 MED ORDER — MORPHINE SULFATE (PF) 10 MG/ML IV SOLN
INTRAVENOUS | Status: AC
  Filled 2017-02-18: qty 1

## 2017-02-18 MED ORDER — HEPARIN SODIUM (PORCINE) 1000 UNIT/ML IJ SOLN
INTRAMUSCULAR | Status: AC
  Filled 2017-02-18: qty 1

## 2017-02-18 MED ORDER — SODIUM CHLORIDE 0.9 % IV SOLN
INTRAVENOUS | Status: DC
  Administered 2017-02-18: 09:00:00 via INTRAVENOUS

## 2017-02-18 MED ORDER — MORPHINE SULFATE (PF) 10 MG/ML IV SOLN: 2.0000 mg | INTRAVENOUS | Status: DC | PRN

## 2017-02-18 MED ORDER — SODIUM CHLORIDE 0.9 % IV SOLN: 500.0000 mL | Freq: Once | INTRAVENOUS | Status: DC | PRN

## 2017-02-18 MED ORDER — ONDANSETRON HCL 4 MG/2ML IJ SOLN: 4.0000 mg | Freq: Four times a day (QID) | INTRAMUSCULAR | Status: DC | PRN

## 2017-02-18 MED ORDER — DOCUSATE SODIUM 100 MG PO CAPS: 100.0000 mg | ORAL_CAPSULE | Freq: Every day | ORAL | Status: DC

## 2017-02-18 MED ORDER — LIDOCAINE HCL (PF) 1 % IJ SOLN
INTRAMUSCULAR | Status: DC | PRN
  Administered 2017-02-18: 15 mL via INTRADERMAL

## 2017-02-18 MED ORDER — OXYCODONE HCL 5 MG PO TABS: 5.0000 mg | ORAL_TABLET | ORAL | Status: DC | PRN

## 2017-02-18 MED ORDER — LIDOCAINE HCL (PF) 1 % IJ SOLN
INTRAMUSCULAR | Status: AC
  Filled 2017-02-18: qty 30

## 2017-02-18 MED ORDER — HEPARIN (PORCINE) IN NACL 2-0.9 UNIT/ML-% IJ SOLN
INTRAMUSCULAR | Status: AC | PRN
  Administered 2017-02-18: 1000 mL

## 2017-02-18 MED ORDER — ALUM & MAG HYDROXIDE-SIMETH 200-200-20 MG/5ML PO SUSP: 15.0000 mL | ORAL | Status: DC | PRN

## 2017-02-18 SURGICAL SUPPLY — 34 items
BALLN ARMADA 18 6.0 X 200 (BALLOONS) ×3
BALLN STERLING OTW 5X100X135 (BALLOONS) ×3
BALLN STERLING OTW 7X100X135 (BALLOONS) ×3
BALLOON ARMADA 18 6.0 X 200 (BALLOONS) ×2 IMPLANT
BALLOON STERLING OTW 5X100X135 (BALLOONS) ×2 IMPLANT
BALLOON STERLING OTW 7X100X135 (BALLOONS) ×2 IMPLANT
CATH ANGIO 5F BER2 65CM (CATHETERS) ×3 IMPLANT
CATH OMNI FLUSH 5F 65CM (CATHETERS) ×3 IMPLANT
CATH QUICKCROSS .035X135CM (MICROCATHETER) ×3 IMPLANT
CATH SOFT-VU 4F 65 STRAIGHT (CATHETERS) ×2 IMPLANT
CATH SOFT-VU STRAIGHT 4F 65CM (CATHETERS) ×1
COVER PRB 48X5XTLSCP FOLD TPE (BAG) ×2 IMPLANT
COVER PROBE 5X48 (BAG) ×1
DEVICE CONTINUOUS FLUSH (MISCELLANEOUS) ×3 IMPLANT
DEVICE TORQUE H2O (MISCELLANEOUS) ×3 IMPLANT
DRAPE ZERO GRAVITY STERILE (DRAPES) ×3 IMPLANT
GUIDEWIRE ANGLED .035X150CM (WIRE) ×3 IMPLANT
GUIDEWIRE ANGLED .035X260CM (WIRE) ×3 IMPLANT
KIT ENCORE 26 ADVANTAGE (KITS) ×3 IMPLANT
KIT MICROINTRODUCER STIFF 5F (SHEATH) ×3 IMPLANT
KIT PV (KITS) ×3 IMPLANT
SHEATH HIGHFLEX ANSEL 7FR 55CM (SHEATH) ×3 IMPLANT
SHEATH PINNACLE 5F 10CM (SHEATH) ×3 IMPLANT
STENT VIABAHN 7X25X120 (Permanent Stent) ×3 IMPLANT
STENT VIABAHN 8X100X120 (Permanent Stent) ×1 IMPLANT
STENT VIABAHN 8X10X120 (Permanent Stent) ×2 IMPLANT
SYR MEDRAD MARK V 150ML (SYRINGE) ×3 IMPLANT
TAPE RADIOPAQUE TURBO (MISCELLANEOUS) ×3 IMPLANT
TRANSDUCER W/STOPCOCK (MISCELLANEOUS) ×3 IMPLANT
TRAY PV CATH (CUSTOM PROCEDURE TRAY) ×3 IMPLANT
WIRE BENTSON .035X145CM (WIRE) ×3 IMPLANT
WIRE G V18X300CM (WIRE) ×3 IMPLANT
WIRE ROSEN-J .035X180CM (WIRE) ×3 IMPLANT
WIRE TORQFLEX AUST .018X40CM (WIRE) ×3 IMPLANT

## 2017-02-18 NOTE — Discharge Instructions (Signed)
Femoral Site Care Refer to this sheet in the next few weeks. These instructions provide you with information about caring for yourself after your procedure. Your health care provider may also give you more specific instructions. Your treatment has been planned according to current medical practices, but problems sometimes occur. Call your health care provider if you have any problems or questions after your procedure. What can I expect after the procedure? After your procedure, it is typical to have the following:  Bruising at the site that usually fades within 1-2 weeks.  Blood collecting in the tissue (hematoma) that may be painful to the touch. It should usually decrease in size and tenderness within 1-2 weeks.  Follow these instructions at home:  Take medicines only as directed by your health care provider.  You may shower 24-48 hours after the procedure or as directed by your health care provider. Remove the bandage (dressing) and gently wash the site with plain soap and water. Pat the area dry with a clean towel. Do not rub the site, because this may cause bleeding.  Do not take baths, swim, or use a hot tub until your health care provider approves.  Check your insertion site every day for redness, swelling, or drainage.  Do not apply powder or lotion to the site.  Limit use of stairs to twice a day for the first 2-3 days or as directed by your health care provider.  Do not squat for the first 2-3 days or as directed by your health care provider.  Do not lift over 10 lb (4.5 kg) for 5 days after your procedure or as directed by your health care provider.  Ask your health care provider when it is okay to: ? Return to work or school. ? Resume usual physical activities or sports. ? Resume sexual activity.  Do not drive home if you are discharged the same day as the procedure. Have someone else drive you.  You may drive 24 hours after the procedure unless otherwise instructed by  your health care provider.  Do not operate machinery or power tools for 24 hours after the procedure or as directed by your health care provider.  If your procedure was done as an outpatient procedure, which means that you went home the same day as your procedure, a responsible adult should be with you for the first 24 hours after you arrive home.  Keep all follow-up visits as directed by your health care provider. This is important. Contact a health care provider if:  You have a fever.  You have chills.  You have increased bleeding from the site. Hold pressure on the site. Get help right away if:  You have unusual pain at the site.  You have redness, warmth, or swelling at the site.  You have drainage (other than a small amount of blood on the dressing) from the site.  The site is bleeding, and the bleeding does not stop after 30 minutes of holding steady pressure on the site.  Your leg or foot becomes pale, cool, tingly, or numb. This information is not intended to replace advice given to you by your health care provider. Make sure you discuss any questions you have with your health care provider. Document Released: 02/25/2014 Document Revised: 11/30/2015 Document Reviewed: 01/11/2014 Elsevier Interactive Patient Education  [employer].

## 2017-02-18 NOTE — Interval H&P Note (Signed)
History and Physical Interval Note:  02/18/2017 11:57 AM  Troy Miller  has presented today for surgery, with the diagnosis of left popitil anorizum  The various methods of treatment have been discussed with the patient and family. After consideration of risks, benefits and other options for treatment, the patient has consented to  Procedure(s): ABDOMINAL AORTOGRAM W/LOWER EXTREMITY (N/A) as a surgical intervention .  The patient's history has been reviewed, patient examined, no change in status, stable for surgery.  I have reviewed the patient's chart and labs.  Questions were answered to the patient's satisfaction.     Annamarie Major

## 2017-02-18 NOTE — Progress Notes (Signed)
Patient arrived to the holding area from the [facility] with a 71F sheath in the right femoral artery. ACT at 1515 was 191. Will re-check in 30 minutes. VS: HR: 51 and sinus brady, BP: 138/74, RR: 16, O2: 100% on 3L, and patient denies pain. Right pedal pulse palpable.

## 2017-02-18 NOTE — Progress Notes (Signed)
VS before sheath pull: HR-49 and sinus brady, RR-12, O2 sats- 100% on 3L [place], BP 148/71. PT pulse palpable and 2+.  Sheath pulled at 1555. Manual pressure held for 22 minutes. Hemostasis achieved at 1617, bedrest to be 4 hours and to begin at 1620. VS post sheath pull- BP: 161/78, HR 47 and sinus brady, RR: 13, O2 sat 100% on 3L [place]. PT pulse palpable and 2+. Femoral sheath pull site level 0.

## 2017-02-18 NOTE — H&P (View-Only) (Signed)
[facility]  Patient name: Troy Miller MRN: [id number] DOB: 12-06-1940 Sex: male   REASON FOR VISIT:    Follow up  HISOTRY OF PRESENT ILLNESS:    Troy Miller is a 76 y.o. male who is back today for discussions of his CT scan for further evaluation of his vascular disease.  He has a history of a right femoral to tibial bypass in 1996 in [place] for claudication.  He recently had Doppler studies that revealed an aneurysm at the right proximal anastomosis.  His ABIs 1.1.  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  There is also a 3.6 cm abdominal aortic aneurysm.  He has a history of CABG in 1994 and redo CABG in 2005. His bilateral great saphenous veins have been used for bypass. He is on Xarelto for atrial flutter. He is on a statin for hyperlipidemia. He is on an ARB for hypertension. He is on home 02 at night. He is a former smoker quitting over 30 years ago. He denies any history of CVA. He denies any recent cardiac issues. Has history of DVT.   PAST MEDICAL HISTORY:   Past Medical History:  Diagnosis Date  . AAA (abdominal aortic aneurysm) ([place])   . Aortic stenosis, mild    07/10/12 EF 50-55% on echo-mitral annular ca+ also  . Atrial fibrillation ([place])    03/19/10 ablation-[name]  . Atrial flutter ([place])   . CAD (coronary artery disease)   . DVT (deep venous thrombosis) ([place])   . History of stress test    Myoview 8/16:  EF 47%, inferior scar, no ischemia, Intermediate risk (low EF)  . Hyperlipemia   . Hypertension   . Iliac artery aneurysm ([place])   . PVD (peripheral vascular disease) ([place])    remote right fem-tib bypass graft sugery  . 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 Lung cancer Mother        smoked  . Heart failure Father   . Stroke Sister     SOCIAL  HISTORY:   Social History  Substance Use Topics  . Smoking status: Former Smoker    Packs/day: 2.00    Years: 25.00    Types: Cigarettes    Quit date: 04/26/1986  . Smokeless tobacco: Never Used  . Alcohol use No     ALLERGIES:   Allergies  Allergen Reactions  . Fentanyl And Related Other (See Comments)    Hallucinations     CURRENT MEDICATIONS:   Current Outpatient Prescriptions  Medication Sig Dispense Refill  . aspirin 81 MG tablet Take 81 mg by mouth daily.    . calcium carbonate (OS-CAL) 600 MG TABS Take 600 mg by mouth 2 (two) times daily with a meal.    . cholecalciferol (VITAMIN D) 1000 UNITS tablet Take 1,000 Units by mouth 2 (two) times daily.    . famotidine (PEPCID) 20 MG tablet One at bedtime 90 tablet 0  . folic acid (FOLVITE) 1 MG tablet Take 1 mg by mouth daily.    . niacin (NIASPAN) 500 MG CR tablet Take 500 mg by mouth once. Takes 4 tabs at bedtime    . pantoprazole (PROTONIX) 40 MG tablet Take 1 tablet (40 mg total) by mouth daily. Take 30-60 min before first meal of the day 90 tablet 0  . Pirfenidone (ESBRIET)  267 MG CAPS Take 3 capsules by mouth 3 (three) times daily. 270 capsule 6  . rivaroxaban (XARELTO) 20 MG TABS tablet Take 1 tablet (20 mg total) by mouth every morning. 30 tablet 1  . simvastatin (ZOCOR) 20 MG tablet TAKE 1 TABLET BY MOUTH EVERY EVENING 30 tablet 6  . valsartan-hydrochlorothiazide (DIOVAN-HCT) 80-12.5 MG tablet TAKE 1 TABLET BY MOUTH DAILY. 30 tablet 6   No current facility-administered medications for this visit.     REVIEW OF SYSTEMS:   _0  denotes positive finding, _1  denotes negative finding Cardiac  Comments:  Chest pain or chest xpressure:    Shortness of breath upon exertion: x   Short of breath when lying flat:    Irregular heart rhythm: x       Vascular    Pain in calf, thigh, or hip brought on by ambulation:    Pain in feet at night that wakes you up from your sleep:     Blood clot in your veins: x   Leg  swelling:  x       Pulmonary    Oxygen at home: x   Productive cough:     Wheezing:         Neurologic    Sudden weakness in arms or legs:     Sudden numbness in arms or legs:     Sudden onset of difficulty speaking or slurred speech:    Temporary loss of vision in one eye:     Problems with dizziness:         Gastrointestinal    Blood in stool:     Vomited blood:         Genitourinary    Burning when urinating:     Blood in urine:        Psychiatric    Major depression:         Hematologic    Bleeding problems:    Problems with blood clotting too easily:        Skin    Rashes or ulcers:        Constitutional    Fever or chills:      PHYSICAL EXAM:   Vitals:   02/10/17 1541  BP: 132/76  Pulse: (!) 51  Resp: 16  Temp: 98.4 F (36.9 C)  TempSrc: Oral  SpO2: 93%  Weight: 206 lb (93.4 kg)  Height: 6' (1.829 m)    GENERAL: The patient is a well-nourished male, in no acute distress. The vital signs are documented above. CARDIAC: There is a regular rate and rhythm.  VASCULAR: Palpable right graft pulse.  Multiple varicosities in both lower extremities.  Palpable femoral pulses pedal pulses are not palpable PULMONARY: Non-labored respirations ABDOMEN: Soft and non-tender with normal pitched bowel sounds.  MUSCULOSKELETAL: There are no major deformities or cyanosis. NEUROLOGIC: No focal weakness or paresthesias are detected. SKIN: There are no ulcers or rashes noted. PSYCHIATRIC: The patient has a normal affect.  STUDIES:   Outside duplex: ABIs, AAA duplex and lower extremity arterial duplex studies reviewed from 12/27/16. His right femoral tibial bypass is patent. ABI of 1.1 on the right and 0.67 on the left. Left distal SFA is aneurysmal measuring 3 x 3 cm and left popliteal measuring 2.3 x 2.4 cm. Aneurysm at right femoral proximal anastomosis 2.4 x 2.3 cm.   I have reviewed his CT angiogram of the abdomen and pelvis with bilateral runoff which has not been  officially read.  The AAA measures  approximate 4 cm.  He has roughly a 3 cm left common iliac aneurysm.  I did not see aneurysmal changes to the right distal superficial femoral to posterior tibial bypass.  There is all below the aneurysm and the left superficial femoral and popliteal artery ending just about the level of the joint space.  It was difficult to determine runoff based on tibial calcification  MEDICAL ISSUES:   AAA/left CIA: This will need to be addressed at a later date.  I'll continue to monitor these.  He will need a CT scan at least in 6 months if not sooner.  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  If the patient's runoff is not healthy, I would need to consider femoral popliteal bypass graft, however saphenous veins had been removed for CABG and lower sternum a bypass.  He would need to have vein mapping of his small saphenous and upper extremities.  I have scheduled him for arteriogram on Tuesday, August 14.  I'll plan on accessing the right groin getting an abdominal aortogram with bilateral runoff and intervening on the left popliteal artery if feasible.He will be off his Jennye Moccasin for 48 hours prior to the procedure   Annamarie Major, MD [facility] [phone] Pager [phone]

## 2017-02-18 NOTE — Op Note (Signed)
Patient name: Troy Miller MRN: [id number] DOB: 1941/07/08 Sex: male  02/18/2017 Pre-operative Diagnosis: Left popliteal aneurysm Post-operative diagnosis:  Same Surgeon:  Annamarie Major Procedure Performed:  1.  Ultrasound-guided access, right femoral artery  2.  Abdominal aortogram  3.  Bilateral lower extremity runoff  4.  Endovascular repair of popliteal artery aneurysm with Viabahn covered stenting to the superficial femoral and popliteal artery  5.  Conscious sedation (125 minutes)    Indications:  The patient presented to the office with findings of multiple aneurysms in his abdominal aorta, iliac arteries his right superficial femoral and left popliteal aneurysm.  He is here today for further diagnostic evaluation and possible endovascular treatment of his left popliteal aneurysm.  Procedure:  The patient was identified in the holding area and taken to room 8.  The patient was then placed supine on the table and prepped and draped in the usual sterile fashion.  A time out was called.  Conscious sedation was administered with the use of IV fentanyl and Versed in a continuous physician and nurse monitoring.  Heart rate, blood pressure, and oxygen saturation were continues to monitor.  Ultrasound was used to evaluate the right common femoral artery.  It was patent .  A digital ultrasound image was acquired.  A micropuncture needle was used to access the right common femoral artery under ultrasound guidance.  An 018 wire was advanced without resistance and a micropuncture sheath was placed.  The 018 wire was removed and a benson wire was placed.  The micropuncture sheath was exchanged for a 5 french sheath.  An omniflush catheter was advanced over the wire to the level of L-1.  An abdominal angiogram was obtained.  Next, using the omniflush catheter and a benson wire, the aortic bifurcation was crossed and the catheter was placed into theleft external iliac artery and left runoff was  obtained.  right runoff was performed via retrograde sheath injections.  Findings:   Aortogram:  No significant renal artery stenosis.  Aneurysmal degeneration of the infrarenal abdominal aorta as well as bilateral iliac arteries.  Right Lower Extremity:  The right common femoral artery appears to be patent but somewhat ectatic.  There is a pseudoaneurysmal appearance of the proximal anastomosis of the right popliteal artery bypass  Left Lower Extremity:  The left common femoral is patent at its course.  Diffuse irregularities with moderate stenosis is noted within the profundofemoral artery.  The superficial femoral artery is patent however it is very tortuous with 2 areas of greater than 90% stenosis as well as aneurysmal change.  The popliteal artery below the knee appears normal in caliber.  The dominant runoff vessel is the peroneal artery.  The anterior tibial artery does opacify but has significant stenosis proximally  Intervention:  After the above images were acquired the decision was made to proceed with intervention.  I used a Rosen wire to help advance a 7 x 55 high flex Ansel 1 sheath into the left superficial femoral artery.  The patient was fully heparinized.  I used a 035 Glidewire and a quick cross to navigate through the stenosis and get catheter access into the below knee popliteal artery.  Next, AB-18 wire was placed.  I had to predilate the lesion using a 5 x 200 followed by a 6 x 200 balloon.  I then selected a ViaBAHN 7 x 200 stent and deployed this at the joint space.  I then selected a 8 x 100 Viabahn and deployed  this within the previously deployed stent.  The stents were postdilated with a 6 mm and a 7 mm balloon.  Completion imaging revealed successful exclusion of the aneurysm with resolution of the stenosis within the superficial femoral popliteal arteries.  Runoff was unchanged.  At this point, catheters and wires were removed.  The patient be taken the holding area for sheath  pull once his coag profile corrects.  Impression:  #1  successful endoluminal bypass to treat a left popliteal aneurysm using a 7 x 200 followed by a 8 x 100 Viabahn stent  #2  aortoiliac aneurysmal degeneration, best seen on CT scan  #3  pseudoaneurysmal changes at the anastomosis of the right popliteal bypass    V. Annamarie Major, M.D. [facility] [place] Office: [phone] Pager:  [phone]

## 2017-02-19 ENCOUNTER — Encounter (HOSPITAL_COMMUNITY): Payer: Self-pay | Admitting: Surgery

## 2017-02-25 ENCOUNTER — Telehealth: Payer: Self-pay | Admitting: Surgery

## 2017-02-25 NOTE — Telephone Encounter (Signed)
-----  Message from Mena Goes, RN sent at 02/18/2017  3:50 PM EDT ----- Regarding: 1 month w/ ABIs and bilateral duplex   ----- Message ----- From: Serafina Mitchell, MD Sent: 02/18/2017   2:27 PM To: Vvs Charge Pool  02/18/2017:   Surgeon:  Annamarie Major Procedure Performed:  1.  Ultrasound-guided access, right femoral artery  2.  Abdominal aortogram  3.  Bilateral lower extremity runoff  4.  Endovascular repair of popliteal artery aneurysm with Viabahn covered stenting to the superficial femoral and popliteal artery  5.  Conscious sedation (125 minutes)   Follow-up with me in one month with ABI and bilateral duplex of the legs

## 2017-02-25 NOTE — Telephone Encounter (Signed)
Spoke to pt for appt date and time  Mailed letter   9/24 OV & [place]

## 2017-02-26 NOTE — Op Note (Deleted)
Patient name: Troy Miller MRN: [id number] DOB: [birth year] Sex: male  02/18/2017 Pre-operative Diagnosis: ESRD Post-operative diagnosis:  Same Surgeon:  Annamarie Major Assistants:   Gerri Lins Procedure:   First stage right basilic vein fistula Anesthesia:  Gen. Blood Loss:  See anesthesia record Specimens:  none  Findings:  I was unable to identify an adequate cephalic vein on ultrasound therefore a first stage basilic vein procedure was performed.  I used a antecubital branch off of basilic vein to perform the anastomosis.  Indications:  The patient has new onset dialysis.  He is here for access.  Vein mapping did not show an adequate left-sided vein.  He did appear to have a right cephalic vein.  He comes in today for his operation.  Procedure:  The patient was identified in the holding area and taken to OR 11. The patient was then placed supine on the table. general anesthesia was administered.  The patient was prepped and draped in the usual sterile fashion.  A time out was called and antibiotics were administered.  Ultrasound was used to evaluate the veins in the right arm.  I did not see an adequate cephalic vein, however the patient had a large basilic vein getting at the antecubital crease.  I made a transverse incision at the antecubital crease.  I first dissected out the brachial artery.  This was a 4 mm artery which was disease-free.  Then dissected out and antecubital vein going to the basilic vein.  This measured approximately 4 mm.  The basilic vein appeared to be at least 5 mm.  I marked the vein for orientation.  It was ligated distally with 2-0 silk tie. It flushed easily with heparin saline. Next, the brachial artery was occluded with Serafin clamps.  No heparin was given.  A #11 blade was used to make an arteriotomy which was extended longitudinally.  The vein was cut to the appropriate length and spatulated.  A running anastomosis was created with 6-0 Prolene.  Prior  to completion, the appropriate flushing maneuvers were performed the anastomosis was completed.  I inspected the course of the vein to make sure there were no kinks.  There was a good thrill within the vein and a multiphasic signal in the radial artery the did not change significantly with fistula compression.  Next, hemostasis was achieved.  The incision was closed with 2 layers of 3-0 Vicryl followed by Dermabond.   Disposition:    To PACU stable   V. Annamarie Major, M.D. [facility] [place] Office: [phone] Pager:  [phone]

## 2017-03-04 ENCOUNTER — Telehealth: Payer: Self-pay | Admitting: Cardiovascular Disease

## 2017-03-04 NOTE — Telephone Encounter (Signed)
Pt called requesting XARELTO samples.    Please give him a  call

## 2017-03-05 NOTE — Telephone Encounter (Signed)
Medication samples have been provided to the patient.  Drug name: Xarelto 20 mg  Qty: 21 tablets  LOT: [id number]  Exp.Date: 12/20  Samples left at front desk for patient pick-up. Patient notified.

## 2017-03-07 ENCOUNTER — Other Ambulatory Visit: Payer: Self-pay

## 2017-03-07 DIAGNOSIS — Z48812 Encounter for surgical aftercare following surgery on the circulatory system: Secondary | ICD-10-CM

## 2017-03-25 ENCOUNTER — Encounter: Payer: Self-pay | Admitting: Pulmonary Disease

## 2017-03-25 ENCOUNTER — Ambulatory Visit (INDEPENDENT_AMBULATORY_CARE_PROVIDER_SITE_OTHER): Payer: Medicare Other | Admitting: Pulmonary Disease

## 2017-03-25 ENCOUNTER — Other Ambulatory Visit (INDEPENDENT_AMBULATORY_CARE_PROVIDER_SITE_OTHER): Payer: Medicare Other

## 2017-03-25 ENCOUNTER — Ambulatory Visit (INDEPENDENT_AMBULATORY_CARE_PROVIDER_SITE_OTHER)
Admission: RE | Admit: 2017-03-25 | Discharge: 2017-03-25 | Disposition: A | Discharge status: Home / Self Care | Payer: Medicare Other | Source: Ambulatory Visit | Attending: Pulmonary Disease | Admitting: Pulmonary Disease

## 2017-03-25 VITALS — BP 128/78 | HR 61 | Ht 72.0 in | Wt 201.2 lb

## 2017-03-25 DIAGNOSIS — R0600 Dyspnea, unspecified: Secondary | ICD-10-CM

## 2017-03-25 DIAGNOSIS — I2510 Atherosclerotic heart disease of native coronary artery without angina pectoris: Secondary | ICD-10-CM | POA: Diagnosis not present

## 2017-03-25 LAB — COMPREHENSIVE METABOLIC PANEL
ALT: 8 U/L (ref 0–53)
AST: 15 U/L (ref 0–37)
Albumin: 4.5 g/dL (ref 3.5–5.2)
Alkaline Phosphatase: 63 U/L (ref 39–117)
BUN: 18 mg/dL (ref 6–23)
CHLORIDE: 103 meq/L (ref 96–112)
CO2: 23 mEq/L (ref 19–32)
Calcium: 10.1 mg/dL (ref 8.4–10.5)
Creatinine, Ser: 1.2 mg/dL (ref 0.40–1.50)
GFR: 62.21 mL/min (ref >60.00)
GLUCOSE: 101 mg/dL — AB (ref 70–99)
POTASSIUM: 4.1 meq/L (ref 3.5–5.1)
SODIUM: 135 meq/L (ref 135–145)
Total Bilirubin: 0.6 mg/dL (ref 0.2–1.2)
Total Protein: 8 g/dL (ref 6.0–8.3)

## 2017-03-25 LAB — CBC
HEMATOCRIT: 47.9 % (ref 39.0–52.0)
Hemoglobin: 15.9 g/dL (ref 13.0–17.0)
MCHC: 33.1 g/dL (ref 30.0–36.0)
MCV: 94 fl (ref 78.0–100.0)
Platelets: 157 10*3/uL (ref 150.0–400.0)
RBC: 5.1 Mil/uL (ref 4.22–5.81)
RDW: 15.4 % (ref 11.5–15.5)
WBC: 9.9 10*3/uL (ref 4.0–10.5)

## 2017-03-25 NOTE — Patient Instructions (Signed)
We'll check a chest x-ray today We'll check lab work including comprehensive metabolic panel and a CBC Follow-up in one month.

## 2017-03-25 NOTE — Progress Notes (Signed)
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 Started on Esbriet 2 months ago. He is a patient of Dr. Chase Caller. He is tolerating the esbriet well. He developed mild headaches which have improved. Denies any GI symptoms with nausea, vomiting, indigestion. He is remembers to use sunscreen and avoid sun exposure. Denies cough, sputum production, fevers, chills, increased dyspnea.  Interim History: Here for a follow-up visit and monitoring of his blood work. He continues on this. With no issues. He does have slight worsening of dyspnea with nonproductive cough. Denies any fevers, chills.  Outpatient Encounter Prescriptions as of 03/25/2017  Medication Sig  . acetaminophen (TYLENOL) 500 MG tablet Take 1,000 mg by mouth daily as needed for moderate pain or headache.  Marland Kitchen aspirin 81 MG tablet Take 81 mg by mouth daily.  . calcium carbonate (OS-CAL) 600 MG TABS Take 600 mg by mouth 2 (two) times daily with a meal.  . Cholecalciferol (VITAMIN D) 2000 units CAPS Take 2,000 Units by mouth daily.  . famotidine (PEPCID) 20 MG tablet TAKE 1 TABLET BY MOUTH EVERYDAY AT BEDTIME  . folic acid (FOLVITE) 1 MG tablet Take 1 mg by mouth every evening.   . niacin (NIASPAN) 500 MG CR tablet Take 2,000 mg by mouth every evening.   . pantoprazole (PROTONIX) 40 MG tablet TAKE 1 TABLET (40 MG TOTAL) BY MOUTH DAILY. TAKE 30-60 MIN BEFORE FIRST MEAL OF THE DAY  . Pirfenidone (ESBRIET) 267 MG CAPS Take 3 capsules by mouth 3 (three) times daily. (Patient taking differently: Take 3 capsules by mouth 3 (three) times daily after meals. )  . rivaroxaban (XARELTO) 20 MG TABS tablet Take 1 tablet (20 mg total) by mouth every morning.  . simvastatin (ZOCOR) 20 MG tablet TAKE 1  TABLET BY MOUTH EVERY EVENING  . valsartan-hydrochlorothiazide (DIOVAN-HCT) 80-12.5 MG tablet TAKE 1 TABLET BY MOUTH DAILY.   No facility-administered encounter medications on file as of 03/25/2017.     Allergies as of 03/25/2017 - Review Complete 03/25/2017  Allergen Reaction Noted  . Fentanyl and related Other (See Comments) 08/31/2012    Past Medical History:  Diagnosis Date  . AAA (abdominal aortic aneurysm) ([place])   . Aortic stenosis, mild    07/10/12 EF 50-55% on echo-mitral annular ca+ also  . Atrial fibrillation ([place])    03/19/10 ablation-[name]  . Atrial flutter ([place])   . CAD (coronary artery disease)   . DVT (deep venous thrombosis) ([place])   . History of stress test    Myoview 8/16:  EF 47%, inferior scar, no ischemia, Intermediate risk (low EF)  . Hyperlipemia   . Hypertension   . Iliac artery aneurysm ([place])   . PVD (peripheral vascular disease) ([place])    remote right fem-tib bypass graft sugery  . 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 ABDOMINAL AORTOGRAM W/LOWER EXTREMITY N/A 02/18/2017   Procedure: ABDOMINAL AORTOGRAM W/LOWER EXTREMITY;  Surgeon: Serafina Mitchell, MD;  Location: [facility];  Service: Cardiovascular;  Laterality: N/A;  . BACK SURGERY  2003  . CARDIAC ELECTROPHYSIOLOGY STUDY AND ABLATION  03/19/10  . CARDIOVERSION  08/16/08  . CHOLECYSTECTOMY  08/13/02  . CORONARY ARTERY BYPASS GRAFT  1994 & 2005  . FEMORAL-TIBIAL BYPASS GRAFT     Remote  . HERNIA REPAIR  06/03/07  . PERIPHERAL VASCULAR INTERVENTION Left 02/18/2017   Procedure: PERIPHERAL VASCULAR INTERVENTION;  Surgeon: Serafina Mitchell, MD;  Location: [place] [facility];  Service: Cardiovascular;  Laterality: Left;  POPLITEAL  . VASECTOMY  01/28/08    Family History  Problem Relation Age of Onset  . Lung cancer Mother        smoked  . Heart failure Father   . Stroke Sister     Social History   Social  History  . Marital status: Single    Spouse name: N/A  . Number of children: N/A  . Years of education: N/A   Occupational History  . Not on file.   Social History Main Topics  . Smoking status: Former Smoker    Packs/day: 2.00    Years: 25.00    Types: Cigarettes    Quit date: 04/26/1986  . Smokeless tobacco: Never Used  . Alcohol use No  . Drug use: No  . Sexual activity: Not on file   Other Topics Concern  . Not on file   Social History Narrative  . No narrative on file    Review of systems: Review of Systems  Constitutional: Negative for fever and chills.  HENT: Negative.   Eyes: Negative for blurred vision.  Respiratory: as per HPI  Cardiovascular: Negative for chest pain and palpitations.  Gastrointestinal: Negative for vomiting, diarrhea, blood per rectum. Genitourinary: Negative for dysuria, urgency, frequency and hematuria.  Musculoskeletal: Negative for myalgias, back pain and joint pain.  Skin: Negative for itching and rash.  Neurological: Negative for dizziness, tremors, focal weakness, seizures and loss of consciousness.  Endo/Heme/Allergies: Negative for environmental allergies.  Psychiatric/Behavioral: Negative for depression, suicidal ideas and hallucinations.  All other systems reviewed and are negative.  Physical Exam: Blood pressure 128/78, pulse 61, height 6' (1.829 m), weight 201 lb 3.2 oz (91.3 kg), SpO2 91 %. Gen:      No acute distress HEENT:  EOMI, sclera anicteric Neck:     No masses; no thyromegaly Lungs:   Basal crackles; normal respiratory effort CV:         Regular rate and rhythm; no murmurs Abd:      + bowel sounds; soft, non-tender; no palpable masses, no distension Ext:    No edema; adequate peripheral perfusion Skin:      Warm and dry; no rash Neuro: alert and oriented x 3 Psych: normal mood and affect  Data Reviewed: PFTs 6/4/1 8 FVC 4.29 [99%) FEV1 3.08 [94%) F/F 72 SVC 81% DLCO 34% Severe diffusion defect   CT  chest 08/09/16-  calcified pleural plaques, peripheral, basal fibrosis with bronchiolectasis, early honeycombing. I have reviewed the images personally  Assessment:  IPF fibrosis Asbestos exposure Continues on esbriet.  He has been on therapy for about 4 months and is tolerating it well.  I'll get a chest x-ray today to make sure there is no acute infiltrate as he reports worsening cough with dyspnea.  He'll continue on the antifibrotic therapy. Monitor CBC and comprehensive metabolic panel today.  Plan/Recommendations: - Check CMP, CBC today - CXR today - Continue esbriet.  Marshell Garfinkel MD [facility] Pager [phone] 03/25/2017, 11:41 AM  CC: [name], PA-C

## 2017-03-28 ENCOUNTER — Encounter: Payer: Self-pay | Admitting: Cardiovascular Disease

## 2017-03-28 ENCOUNTER — Ambulatory Visit (INDEPENDENT_AMBULATORY_CARE_PROVIDER_SITE_OTHER): Payer: Medicare Other | Admitting: Cardiovascular Disease

## 2017-03-28 VITALS — BP 130/68 | HR 59 | Ht 72.0 in | Wt 199.0 lb

## 2017-03-28 DIAGNOSIS — I714 Abdominal aortic aneurysm, without rupture, unspecified: Secondary | ICD-10-CM

## 2017-03-28 DIAGNOSIS — I350 Nonrheumatic aortic (valve) stenosis: Secondary | ICD-10-CM | POA: Diagnosis not present

## 2017-03-28 DIAGNOSIS — E782 Mixed hyperlipidemia: Secondary | ICD-10-CM

## 2017-03-28 DIAGNOSIS — J61 Pneumoconiosis due to asbestos and other mineral fibers: Secondary | ICD-10-CM

## 2017-03-28 DIAGNOSIS — I724 Aneurysm of artery of lower extremity: Secondary | ICD-10-CM

## 2017-03-28 DIAGNOSIS — Z7901 Long term (current) use of anticoagulants: Secondary | ICD-10-CM

## 2017-03-28 DIAGNOSIS — I2510 Atherosclerotic heart disease of native coronary artery without angina pectoris: Secondary | ICD-10-CM | POA: Diagnosis not present

## 2017-03-28 NOTE — Progress Notes (Signed)
Patient ID: Troy Miller, male   DOB: 03-Feb-1941, 76 y.o.   MRN: [id number]     HPI: Troy Miller is a 76 y.o. male who presents for a 4 month cardiology and sleep evaluation.   Troy Miller has a history of CAD and in 1994 underwent initial CABG revascularization surgery. In May 2005 he underwent redo surgery with a RIMA to the LAD, vein graft to distal RCA by [name]. His previous LIMA graft was preserved and supplied the OM1 vessel. He has history of atrial flutter status post ablation by [name] as well as a history of PAF. He has documented peripheral vascular disease with abdominal aortic aneurysm, as well as common iliac artery aneurysms. He status post right fem-tib bypass graft surgery. Additional problems include hypertension as well as mixed hyperlipidemia. He also has mild aortic stenosis and documentation of an infrarenal abdominal aortic aneurysm.   A followup echo Doppler study on 05/11/2013 showed an ejection fraction in the 45-50% range with mild inferior hypokinesis.  There was grade 1 diastolic dysfunction.  His aortic valve was moderately calcified and there was evidence for mild aortic valve stenosis with a peak gradient of 19 and mean gradient of 10. Aortic valve area was 1.5 1.64 cm.  There was mild aortic root dilatation at 42 mm mild dilatation of the ascending aorta..  There was mitral regurgitation.  A follow-up abdominal aortic Doppler study on April 29, 2014 demonstrated an infrarenal fusiform aneurysm at 3.63.7 with mild amount of atherosclerosis visualized throughout.  The right and left proximal common iliac arteries also demonstrated aneurysmal dilatation.  The right common iliac artery measured 2.3.  A 2.3 without evidence for stenosis.  Left common iliac artery measured 3.2 x 3.0 with mild amount of thrombus and low flow velocity.  He underwent a follow-up ultrasound last week which showed slight progression.  His aneurysm in the distal aorta, now  measured 4.03.7.  There was a new finding of a right common iliac artery aneurysm measuring 2.2 by 2.3 cm.  The left common iliac artery was stable at 3.2 x 3 point 0 cm.  The IVC veins were patent.  A follow-up evaluation was recommended in one year.  A follow-up echo Doppler study on 11/04/2014 demonstrated an EF 50-55% without regional wall motion abnormalities.  There was evidence for mild aortic valve stenosis valve area of 1. 4 9 and 1.43 by measurements. Mean gradient was 14 and peak gradient 32 mmHg.  He saw Troy Miller with complaints of chest discomfort in 2016.  A nuclear perfusion study was  on 02/10/2015:  Ejection fraction was 47%. There was evidence for prior inferior perfusion defect suggesting prior infarction.  There was no ischemia.   Presently, Troy Miller denies chest pain.  He recently underwent several treatment of skin lesions on his face by his dermatologist and will need additional treatment for 1.  On his nose which he states was positive for mild malignancy. He denies anginal symptoms.  He denies PND, orthopnea.  He denies bleeding.   Troy Miller has a history of obstructive sleep apnea dating back to 2005.  His initial sleep study showed severe sleep apnea with an AHI of 33.9, and in addition to obstructive events had central events.  Initial O2 desaturation was 81%.  Remotely he had seen Dr. [name], he had undergone a trial of BiPAP, but reportedly had felt better with CPAP therapy.  He has not used CPAP therapy for at least 3-4 years.  His DME company had been [employer] which is no longer in business.  He admits to daytime sleepiness.  He does snore.  He denies any recent CHF symptomatology.  When I last saw him, I deferred him for a new sleep study.  This was done on 12/22/2015.  This confirmed moderate sleep apnea with an AHI overall of 26 per hour.  However, sleep apnea was severe during [name] sleep with an AHI 51.4.  He had significant oxygen desaturation to 82% in time  below 88% was 76.4 minutes.  There was moderate snoring.  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  He received  a new CPAP machine.  He admits to 100% compliance.  His sleep has improved.  He denies daytime sleepiness and is unaware of breakthrough snoring.  He is now also being seen at the [facility].  Remotely he developed cough secondary to ace inhibition.  He is seeing Dr. 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  Occasionally he require supplemental oxygen if he is walking but typically does not use oxygen routinely.   Since I last saw him, he underwent a follow-up echo Doppler study and on 12/06/2016.  This showed previously noted ejection fraction reduction at 40-45%.  There was hypokinesis inferiorly.  HEENT abnormal diastolic dysfunction.  There was mild-to-moderate aortic valve stenosis with a mean gradient of 17 and peak gradient of 34 mmHg.  There was mitral annular calcification and mild LA dilation.    He underwent a follow-up PV evaluation by Dr. Gwenlyn Found on 01/03/2017.  Subsequent Doppler studies revealed an abdominal aorta that measured 3.6 cm, left iliac aneurysm, as well as distal left SFA and popliteal aneurysm.  He was referred to Dr. Trula Slade.  On 02/18/2017, he underwent abdominal aortic angiogram, and peripheral vascular intervention by Dr. 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  He has noticed improvement in his walking capability since this.  However, he continues to get shortness of breath with walking due to his pulmonary fibrosis.  He continues to use CPAP with 100% compliance.  He presents for reevaluation  Past Medical History:  Diagnosis Date  . AAA (abdominal aortic aneurysm) ([place])   . Aortic stenosis, mild    07/10/12 EF 50-55% on echo-mitral annular ca+ also  . Atrial fibrillation  ([place])    03/19/10 ablation-[name]  . Atrial flutter ([place])   . CAD (coronary artery disease)   . DVT (deep venous thrombosis) ([place])   . History of stress test    Myoview 8/16:  EF 47%, inferior scar, no ischemia, Intermediate risk (low EF)  . Hyperlipemia   . Hypertension   . 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 PVD (peripheral vascular disease) ([place])    remote right fem-tib bypass graft sugery  . 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 ABDOMINAL AORTOGRAM W/LOWER EXTREMITY N/A 02/18/2017   Procedure: ABDOMINAL AORTOGRAM W/LOWER EXTREMITY;  Surgeon: Serafina Mitchell, MD;  Location: [facility];  Service: Cardiovascular;  Laterality: N/A;  . BACK SURGERY  2003  . CARDIAC ELECTROPHYSIOLOGY STUDY AND ABLATION  03/19/10  . CARDIOVERSION  08/16/08  . CHOLECYSTECTOMY  08/13/02  . CORONARY ARTERY BYPASS GRAFT  1994 & 2005  . FEMORAL-TIBIAL  BYPASS GRAFT     Remote  . HERNIA REPAIR  06/03/07  . PERIPHERAL VASCULAR INTERVENTION Left 02/18/2017   Procedure: PERIPHERAL VASCULAR INTERVENTION;  Surgeon: Serafina Mitchell, MD;  Location: [facility];  Service: Cardiovascular;  Laterality: Left;  POPLITEAL  . VASECTOMY  01/28/08    Allergies  Allergen Reactions  . Fentanyl And Related Other (See Comments)    Hallucinations    Current Outpatient Prescriptions  Medication Sig Dispense Refill  . acetaminophen (TYLENOL) 500 MG tablet Take 1,000 mg by mouth daily as needed for moderate pain or headache.    Marland Kitchen aspirin 81 MG tablet Take 81 mg by mouth daily.    . calcium carbonate (OS-CAL) 600 MG TABS Take 600 mg by mouth 2 (two) times daily with a meal.    . Cholecalciferol (VITAMIN D) 2000 units CAPS Take 2,000 Units by mouth daily.    . famotidine (PEPCID) 20 MG tablet TAKE 1 TABLET BY MOUTH EVERYDAY AT BEDTIME 90 tablet 0  . folic acid (FOLVITE) 1 MG tablet Take 1 mg by mouth every evening.     . niacin  (NIASPAN) 500 MG CR tablet Take 2,000 mg by mouth every evening.     . pantoprazole (PROTONIX) 40 MG tablet TAKE 1 TABLET (40 MG TOTAL) BY MOUTH DAILY. TAKE 30-60 MIN BEFORE FIRST MEAL OF THE DAY 90 tablet 0  . Pirfenidone (ESBRIET) 267 MG CAPS Take 3 capsules by mouth 3 (three) times daily.    . rivaroxaban (XARELTO) 20 MG TABS tablet Take 1 tablet (20 mg total) by mouth every morning. 30 tablet 1  . simvastatin (ZOCOR) 20 MG tablet TAKE 1 TABLET BY MOUTH EVERY EVENING 30 tablet 6  . valsartan-hydrochlorothiazide (DIOVAN-HCT) 80-12.5 MG tablet TAKE 1 TABLET BY MOUTH DAILY. 30 tablet 6   No current facility-administered medications for this visit.     Social History   Social History  . Marital status: Single    Spouse name: N/A  . Number of children: N/A  . Years of education: N/A   Occupational History  . Not on file.   Social History Main Topics  . Smoking status: Former Smoker    Packs/day: 2.00    Years: 25.00    Types: Cigarettes    Quit date: 04/26/1986  . Smokeless tobacco: Never Used  . Alcohol use No  . Drug use: No  . Sexual activity: Not on file   Other Topics Concern  . Not on file   Social History Narrative  . No narrative on file    Family History  Problem Relation Age of Onset  . Lung cancer Mother        smoked  . Heart failure Father   . Stroke Sister    Socially he is divorced has 3 children and 2 grandchildren. He is not routine exercise. No tobacco or alcohol use.  ROS General: Negative; No fevers, chills, or night sweats;  HEENT: Now with a hearing aid in his right ear due to decreased hearing., sinus congestion, difficulty swallowing Pulmonary: Shortness of breath, positive for asbestosis. Cardiovascular:  See HPI: No change in claudication GI: Negative; No nausea, vomiting, diarrhea, or abdominal pain GU: Negative; No dysuria, hematuria, or difficulty voiding Musculoskeletal: Negative; no myalgias, joint pain, or  weakness Hematologic/Oncology: Negative; no easy bruising, bleeding Endocrine: Negative; no heat/cold intolerance; no diabetes Neuro: Occasional transient toe paresthesias; no changes in balance, headaches Skin: Negative; No rashes or skin lesions Psychiatric: Negative; No behavioral problems,  depression Sleep: Positive for sleep apnea, currently untreated for the past 3-4 years.  Originally diagnosed in 2005, both obstructive and central events.  Positive for daytime sleepiness, hypersomnolence; no bruxism, restless legs, hypnogognic hallucinations, no cataplexy Other comprehensive 14 point system review is negative.  PE BP 130/68   Pulse (!) 59   Ht 6' (1.829 m)   Wt 199 lb (90.3 kg)   BMI 26.99 kg/m    Wt Readings from Last 3 Encounters:  03/28/17 199 lb (90.3 kg)  03/25/17 201 lb 3.2 oz (91.3 kg)  02/18/17 206 lb (93.4 kg)   General: Alert, oriented, no distress.  Skin: normal turgor, no rashes, warm and dry HEENT: Normocephalic, atraumatic. Pupils equal round and reactive to light; sclera anicteric; extraocular muscles intact;  Nose without nasal septal hypertrophy Mouth/Parynx benign; Mallinpatti scale 3 Neck: No JVD, no carotid bruits; normal carotid upstroke Lungs: clear to ausculatation and percussion; no wheezing or rales Chest wall: without tenderness to palpitation Heart: PMI not displaced, RRR, s1 s2 normal, 2/6 systolic murmur, no diastolic murmur, no rubs, gallops, thrills, or heaves Abdomen: I'll diastases recti; soft, nontender; no hepatosplenomehaly, BS+; abdominal aorta nontender and not dilated by palpation. Back: no CVA tenderness Pulses 2+ Musculoskeletal: full range of motion, normal strength, no joint deformities Extremities: no clubbing cyanosis or edema, Homan's sign negative  Neurologic: grossly nonfocal; Cranial nerves grossly wnl Psychologic: Normal mood and affect   ECG (independently read by me): Sinus bradycardia 59 bpm with sinus arrhythmia.   Old inferior Q waves.  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  May 2018 ECG (independently read by me): sinus bradycardia 51 bpm.  Left axis deviation.  Inferior Q waves with early transition.  Normal intervals.  April 2017 ECG (independently read by me): Normal sinus rhythm with PACs.  Heart rate 62 bpm.  Inferior posterior MI  November 2016 ECG (independently read by me):  Normal sinus rhythm at 62 bpm.   Incomplete right bundle branch block.  Prior ECG (independently read by me): Sinus bradycardia at 58 bpm with an isolate a PVC and occasional PACs with mild sinus arrhythmia.  Evidence for prior inferior posterior infarct.  Nonspecific ST changes.  Prior 11/05/2013 ECG (independently read by me): Sinus bradycardia at 49 beats per minute.  QTc interval 426 ms.  PR interval 174 ms.  Mild RV conduction delay  Prior 04/26/2013 ECG: Marked sinus bradycardia 43 beats per minute. Left axis deviation. 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  LABS:  BMP Latest Ref Rng & Units 03/25/2017 02/18/2017 01/21/2017  Glucose 70 - 99 mg/dL 101(H) 112(H) 118(H)  BUN 6 - 23 mg/dL _0 Creatinine 0.40 - 1.50 mg/dL 1.20 1.20 1.23  BUN/Creat Ratio 10 - 24 - - -  Sodium 135 - 145 mEq/L 135 140 136  Potassium 3.5 - 5.1 mEq/L 4.1 4.5 4.0  Chloride 96 - 112 mEq/L 103 105 103  CO2 19 - 32 mEq/L 23 - 23  Calcium 8.4 - 10.5 mg/dL 10.1 - 9.9   Hepatic Function Latest Ref Rng & Units 03/25/2017 01/21/2017 11/08/2016  Total Protein 6.0 - 8.3 g/dL 8.0 7.6 8.0  Albumin 3.5 - 5.2 g/dL 4.5 4.6 4.8  AST 0 - 37 U/L _1 ALT 0 - 53 U/L _2 Alk Phosphatase 39 - 117 U/L 63 57 60  Total Bilirubin 0.2 - 1.2 mg/dL 0.6 0.6 0.8  Bilirubin, Direct 0.0 - 0.3 mg/dL - -  0.2   CBC Latest Ref Rng & Units 03/25/2017 02/18/2017 01/21/2017  WBC 4.0 - 10.5 K/uL 9.9 - 9.8  Hemoglobin 13.0 - 17.0 g/dL 15.9 16.7 15.5  Hematocrit 39.0 - 52.0 % 47.9 49.0 45.7  Platelets 150.0 -  400.0 K/uL 157.0 - 194.0   Lab Results  Component Value Date   MCV 94.0 03/25/2017   MCV 91.1 01/21/2017   MCV 90 12/06/2016   No results found for: HGBA1C  Lab Results  Component Value Date   TSH 3.440 12/06/2016   Lipid Panel     Component Value Date/Time   CHOL 95 (L) 12/06/2016 0839   CHOL 118 05/05/2013 0839   TRIG 136 12/06/2016 0839   TRIG 202 (H) 05/05/2013 0839   HDL 34 (L) 12/06/2016 0839   HDL 38 (L) 05/05/2013 0839   CHOLHDL 2.8 12/06/2016 0839   CHOLHDL 2.6 05/20/2014 0823   VLDL 27 05/20/2014 0823   LDLCALC 34 12/06/2016 0839   LDLCALC 40 05/05/2013 0839     RADIOLOGY: No results found.  IMPRESSION: 1. Coronary artery disease involving native coronary artery of native heart without angina pectoris   2. Mild -Moderateaortic stenosis   3. Abdominal aortic aneurysm (AAA) without rupture ([place])   4. 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 Chronic anticoagulation   7. Asbestosis [facility])     ASSESSMENT AND PLAN: Mr. Dubree  Is a 76 year old Caucasian male who has CAD dating back to 1994 when he underwent his initial CABG surgery. He is 12 years status post redo bypass surgery in 2005.  He is status post remote right fem-tib bypass graft surgery in 1996.  He has a remote history of atrial fibrillation and is status post ablation.  He has a history of mixed hyperlipidemia and has been on combination therapy.  When I last saw him, he had not had a follow-up evaluation in several years.  I scheduled him for Doppler study to follow-up his abdominal aortic aneurysm and lower extremity arterial Doppler study.  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  His aortogram revealed aneurysmal degeneration of the infrarenal abdominal aorta as well as bilateral iliac arteries.  There was a pseudoaneurysm in the proximal  anastomosis of the right popliteal artery bypass.  Subsequent, he has done well.  He tolerated surgery well.  I reviewed recent laboratory from several days ago which is stable.  Most recent lipid study from 3 months ago revealed an LDL cholesterol at 34.  Total cholesterol 95 with triglycerides 136 on his combination therapy with low-dose niacin and simvastatin.  He is not had recurrent anginal symptomatology.  His echo Doppler study did show slight progression of his aortic stenosis.  In June 2019  I am recommending a one-year follow-up echo Doppler evaluation to reassess his aortic stenosis as well as systolic and diastolic function.  He will also be having follow-up with Dr. Trula Slade.  His blood pressure today is stable on low-dose valsartan HCT 80/12.5.  He continues to use CPAP with 100% compliance and denies any daytime sleepiness or awareness of residual snoring.  I will see him in follow-up of his ultrasound, June 2019 for further evaluation. 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   Troy Sine, MD, [facility]  03/28/2017 9:13 AM

## 2017-03-28 NOTE — Patient Instructions (Signed)
Medication Instructions:  Your physician recommends that you continue on your current medications as directed. Please refer to the Current Medication list given to you today.  Testing/Procedures: Your physician has requested that you have an echocardiogram June 2019. Echocardiography is a painless test that uses sound waves to create images of your heart. It provides your doctor with information about the size and shape of your heart and how well your heart's chambers and valves are working. This procedure takes approximately one hour. There are no restrictions for this procedure.  Follow-Up: Your physician wants you to follow-up in: After Echo with Dr. Claiborne Billings. You will receive a reminder letter in the mail two months in advance. If you don't receive a letter, please call our office to schedule the follow-up appointment.   Any Other Special Instructions Will Be Listed Below (If Applicable).      If you need a refill on your cardiac medications before your next appointment, please call your pharmacy.

## 2017-03-31 ENCOUNTER — Encounter: Payer: Medicare Other | Admitting: Surgery

## 2017-03-31 ENCOUNTER — Encounter (HOSPITAL_COMMUNITY): Payer: Medicare Other

## 2017-04-07 ENCOUNTER — Telehealth: Payer: Self-pay | Admitting: Cardiovascular Disease

## 2017-04-07 NOTE — Telephone Encounter (Signed)
2 weeks sample provided and patient assistance form given as well.

## 2017-04-07 NOTE — Telephone Encounter (Signed)
Received 2 bottles of samples (Lot: [id number] Ex: 02/21) from [facility]. Patient notified to pick up at our office. Printed patient assistance paperwork and this was placed w/samples  MD portion of paperwork in MD mailbox to be signed

## 2017-04-07 NOTE — Telephone Encounter (Signed)
New message ° °Patient calling the office for samples of medication: ° ° °1.  What medication and dosage are you requesting samples for? Xarelto 20mg  ° °2.  Are you currently out of this medication? yes ° ° ° °

## 2017-04-07 NOTE — Telephone Encounter (Signed)
Returned call to patient He has been out of xarelto for 2 days Advised he should notify our office when he has no less than 1 week of the med left He states he cannot afford an Rx  Routed to pharmacy staff for assistance

## 2017-04-09 NOTE — Telephone Encounter (Signed)
MD portion complete.  Spoke to patient, has not completed patient portion yet.  Advised once completed, bring forms to office and I will fax paperwork together.

## 2017-04-14 ENCOUNTER — Telehealth: Payer: Self-pay | Admitting: Pulmonary Disease

## 2017-04-14 NOTE — Telephone Encounter (Signed)
Pt is aware of results and voiced his understanding. Nothing further needed.

## 2017-04-14 NOTE — Telephone Encounter (Signed)
Attempted to call patient. Call would not go through. Received a busy tone as soon as I dialed the last digit. Will attempt to call back later.

## 2017-04-14 NOTE — Telephone Encounter (Signed)
Patient is returning phone call. [phone]

## 2017-04-14 NOTE — Telephone Encounter (Signed)
Notes recorded by Marshell Garfinkel, MD on 03/27/2017 at 12:21 PM EDT Please let the patient know that the CXR shows stable scarring and fibrosis. There is no evidence of any infiltrate or acute problem.   Tried the number above 3 times. It keeps giving busy signal.

## 2017-04-17 ENCOUNTER — Telehealth: Payer: Self-pay | Admitting: Cardiovascular Disease

## 2017-04-17 NOTE — Telephone Encounter (Signed)
Patient aware.

## 2017-04-17 NOTE — Telephone Encounter (Signed)
New message    Patient requesting samples   Patient calling the office for samples of medication   1.  What medication and dosage are you requesting samples for?rivaroxaban (XARELTO) 20 MG TABS tablet 2.  Are you currently out of this medication? 3 MORE TABLETS

## 2017-04-17 NOTE — Telephone Encounter (Addendum)
Advised patient no samples but would reach out to the [facility] office. He will be bringing his patient assistance papers to [facility] office tomorrow when he is in [place].

## 2017-04-17 NOTE — Telephone Encounter (Signed)
I will place two weeks of samples at the front desk.

## 2017-04-18 ENCOUNTER — Ambulatory Visit (INDEPENDENT_AMBULATORY_CARE_PROVIDER_SITE_OTHER)
Admission: RE | Admit: 2017-04-18 | Discharge: 2017-04-18 | Disposition: A | Discharge status: Home / Self Care | Payer: Medicare Other | Source: Ambulatory Visit | Attending: Surgery | Admitting: Surgery

## 2017-04-18 ENCOUNTER — Ambulatory Visit (HOSPITAL_COMMUNITY)
Admission: RE | Admit: 2017-04-18 | Discharge: 2017-04-18 | Disposition: A | Discharge status: Home / Self Care | Payer: Medicare Other | Source: Ambulatory Visit | Attending: Surgery | Admitting: Surgery

## 2017-04-18 DIAGNOSIS — Z48812 Encounter for surgical aftercare following surgery on the circulatory system: Secondary | ICD-10-CM | POA: Insufficient documentation

## 2017-04-18 LAB — VAS US LOWER EXTREMITY ARTERIAL DUPLEX
LATIBDISTSYS: 68 cm/s
LPERODISTSYS: 104 cm/s
LPTIBDISTSYS: 12 cm/s
Left super femoral mid sys PSV: -67 cm/s
Left super femoral prox sys PSV: -79 cm/s
RATIBDISTSYS: -16 cm/s
RIGHT POST TIB DIST SYS: -98 cm/s
RPERPSV: 38 cm/s
Right super femoral prox sys PSV: -79 cm/s

## 2017-04-21 ENCOUNTER — Encounter: Payer: Self-pay | Admitting: Surgery

## 2017-04-21 ENCOUNTER — Ambulatory Visit (INDEPENDENT_AMBULATORY_CARE_PROVIDER_SITE_OTHER): Payer: Medicare Other | Admitting: Surgery

## 2017-04-21 VITALS — BP 104/58 | HR 51 | Temp 97.4°F | Resp 18 | Ht 72.0 in | Wt 198.0 lb

## 2017-04-21 DIAGNOSIS — I724 Aneurysm of artery of lower extremity: Secondary | ICD-10-CM

## 2017-04-21 DIAGNOSIS — I2510 Atherosclerotic heart disease of native coronary artery without angina pectoris: Secondary | ICD-10-CM | POA: Diagnosis not present

## 2017-04-21 DIAGNOSIS — I714 Abdominal aortic aneurysm, without rupture, unspecified: Secondary | ICD-10-CM

## 2017-04-21 NOTE — Progress Notes (Signed)
POST OPERATIVE OFFICE NOTE    CC:  F/u for surgery  HPI:  This is a 76 y.o. 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 Ivey Nembhard.  He has a history of a right femoral to tibial bypass in 1996 in [place] for claudication.  He recently had Doppler studies that revealed an aneurysm at the right proximal anastomosis.     He has a history of CABG in 1994 and redo CABG in 2005. His bilateral great saphenous veins have been used for bypass. He is on Xarelto for atrial flutter. He is on a statin for hyperlipidemia. He is on an ARB for hypertension. He is on home 02 at night. He is a former smoker quitting over 30 years ago. He denies any history of CVA. He denies any recent cardiac issues. Has history of DVT.    He reports no pain issues, no change in medication or new medical issues since his procedure.  Allergies  Allergen Reactions  . Fentanyl And Related Other (See Comments)    Hallucinations    Current Outpatient Prescriptions  Medication Sig Dispense Refill  . acetaminophen (TYLENOL) 500 MG tablet Take 1,000 mg by mouth daily as needed for moderate pain or headache.    Marland Kitchen aspirin 81 MG tablet Take 81 mg by mouth daily.    . calcium carbonate (OS-CAL) 600 MG TABS Take 600 mg by mouth 2 (two) times daily with a meal.    . Cholecalciferol (VITAMIN D) 2000 units CAPS Take 2,000 Units by mouth daily.    . famotidine (PEPCID) 20 MG tablet TAKE 1 TABLET BY MOUTH EVERYDAY AT BEDTIME 90 tablet 0  . folic acid (FOLVITE) 1 MG tablet Take 1 mg by mouth every evening.     . niacin (NIASPAN) 500 MG CR tablet Take 2,000 mg by mouth every evening.     . pantoprazole (PROTONIX) 40 MG tablet TAKE 1 TABLET (40 MG TOTAL) BY MOUTH DAILY. TAKE 30-60 MIN BEFORE FIRST MEAL OF THE DAY 90 tablet 0  . Pirfenidone (ESBRIET) 267 MG CAPS Take 3 capsules by mouth 3 (three) times daily.    .  rivaroxaban (XARELTO) 20 MG TABS tablet Take 1 tablet (20 mg total) by mouth every morning. 30 tablet 1  . simvastatin (ZOCOR) 20 MG tablet TAKE 1 TABLET BY MOUTH EVERY EVENING 30 tablet 6  . valsartan-hydrochlorothiazide (DIOVAN-HCT) 80-12.5 MG tablet TAKE 1 TABLET BY MOUTH DAILY. 30 tablet 6   No current facility-administered medications for this visit.      ROS:  See HPI  Physical Exam:  Vitals:   04/21/17 1300  BP: (!) 104/58  Pulse: (!) 51  Resp: 18  Temp: (!) 97.4 F (36.3 C)  SpO2: 95%   ABI: Right 1.22 Left 0.90  Arterial Duplex: Right By pass without recurrent stenosis, patent biphasic Left patent SFA and popliteal stents biphasic flow  Review of angiogram of right by pass pseudo aneurysm shows 2.0 cm.     Incision:  Healed stick site at right femoral artery area. Extremities:  Warm to touch non palpable pedal pulses.  Active range of motion and sensation intact.   Heart: A fib, with murmur Abdomen:  + BS Lungs non labored breathing  Assessment/Plan:  This is a 76 y.o. 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  Biphasic flow  B LE.   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  We will continue to watch this.  Plan is for him to undergo CTA Abd/Pelvis with B LE runoff in 6 months.  Dr. Trula Slade discussed the possibility of angiogram and stent placement on the right  Pseudoaneurysm if it continues to grow.     Activity as tolerates and continue maximum medical management with Xarelto, aspirin, and Zocor.        Laurence Slate [facility] PA-C [facility] [phone]  Clinic MD:  Trula Slade  I agree with the above.  I have seen and evaluated the patient.  He recently underwent endovascular repair of a left popliteal artery aneurysm with Viabahn covered stenting to the superficial femoral and  popliteal artery.  He has no he has known iliac artery aneurysmal changes as well as a pseudoaneurysm off the origin of his right distal superficial femoral artery bypass graft.  Patient is doing where well at this time.  His incision has healed nicely.  He is back on his anticoagulants.  I have recommended repeating his CT and Phillip Heal with bilateral runoff for better evaluation of his iliac aneurysm as well as the pseudoaneurysm in the distal right superficial femoral/proximal right popliteal artery.  I suspect this could potentially be treated with a Viabahn if it has gotten larger.  Again he'll follow up in 6 months  Wells Nethra Mehlberg

## 2017-04-22 NOTE — Addendum Note (Signed)
Addended by: Lianne Cure A on: 04/22/2017 03:57 PM   Modules accepted: Orders

## 2017-04-25 NOTE — Telephone Encounter (Signed)
Patient portion of patient assistance received-pt assistance application completed and faxed to [employer].

## 2017-05-02 ENCOUNTER — Telehealth: Payer: Self-pay | Admitting: Cardiovascular Disease

## 2017-05-02 NOTE — Telephone Encounter (Signed)
Samples at the front desk Pt notified

## 2017-05-02 NOTE — Telephone Encounter (Signed)
New message    Pt is calling   Patient calling the office for samples of medication:   1.  What medication and dosage are you requesting samples for? xarelto 20 mg  2.  Are you currently out of this medication? No pt has enough for Monday

## 2017-05-12 ENCOUNTER — Other Ambulatory Visit: Payer: Medicare Other

## 2017-05-12 ENCOUNTER — Ambulatory Visit (INDEPENDENT_AMBULATORY_CARE_PROVIDER_SITE_OTHER): Payer: Medicare Other | Admitting: Internal Medicine

## 2017-05-12 ENCOUNTER — Encounter: Payer: Self-pay | Admitting: Internal Medicine

## 2017-05-12 ENCOUNTER — Other Ambulatory Visit (INDEPENDENT_AMBULATORY_CARE_PROVIDER_SITE_OTHER): Payer: Medicare Other

## 2017-05-12 ENCOUNTER — Other Ambulatory Visit: Payer: Self-pay | Admitting: Internal Medicine

## 2017-05-12 VITALS — BP 122/66 | HR 49 | Ht 72.0 in | Wt 200.2 lb

## 2017-05-12 DIAGNOSIS — Z23 Encounter for immunization: Secondary | ICD-10-CM

## 2017-05-12 DIAGNOSIS — Z5181 Encounter for therapeutic drug level monitoring: Secondary | ICD-10-CM

## 2017-05-12 DIAGNOSIS — I2510 Atherosclerotic heart disease of native coronary artery without angina pectoris: Secondary | ICD-10-CM | POA: Diagnosis not present

## 2017-05-12 DIAGNOSIS — J84112 Idiopathic pulmonary fibrosis: Secondary | ICD-10-CM

## 2017-05-12 DIAGNOSIS — R911 Solitary pulmonary nodule: Secondary | ICD-10-CM | POA: Diagnosis not present

## 2017-05-12 NOTE — Patient Instructions (Signed)
ICD-10-CM   1. IPF (idiopathic pulmonary fibrosis) ([place]) J84.112   2. Therapeutic drug monitoring Z51.81   3. Nodule of left lung R91.1     Clinically stable  Plan - check LFT and bmet 05/12/2017 - do LFT December 2018 and jan 2019 with PCP [name], PA-C for Liver test  - any abnormality they should call us - continue esbriet  - if liver test 05/12/2017 stable, then we will do the full dose version of 1 pill three times daily - in 3 months do HRCT - in 3 months do Pre-bd spiro and dlco only. No lung volume or bd response. No post-bd spiro  Followup In 3 months with Dr Chase Caller but after HRCT and PFT

## 2017-05-12 NOTE — Progress Notes (Signed)
Subjective:     Patient ID: Troy Miller, male   DOB: 1940-10-10, 76 y.o.   MRN: [id number]  HPI   OV 05/12/2017  Chief Complaint  Patient presents with  . Follow-up    Pt states that he is doing good. C/o SOB with exertion, occ. cough with clear to white mucus. Denies any CP.   Follow-up idiopathic pulmonary fibrosis started on Pirfenidone (Esbriet) and June 2018.  Follow left lower lobe lung nodule 8 mm in February 2018   He is here for routine follow-up. Last visit he did see my colleague and noticed some increased shortness of breath but currently this is settled. He is on full dose Pirfenidone (Esbriet) and is tolerating it well without any problems. He is interested in enrolling into the 1 pill format. He denies any worsening shortness of breath or cough that he does have shortness of breath with exertion relieved by rest but this is stable. His most recent liver function test was in September 2018 and normal. His creatinine was slightly elevated at 1.2 mg percent at that time. He is interested in research protocols in the future. He lives one hour away and it would be easier for him to have liver function tests monitoring locally he's not had a follow-up CT scan of the chest for his left lower lobe nodule noted in August 2080 needed have a CT angiogram that showed his lung nodule is stable compared to February 2018.  Results for Troy Miller, Troy Miller (MRN [id number]) as of 05/12/2017 17:06  Ref. Range 04/14/2015 11:47 05/03/2016 12:52 12/09/2016 15:22  FVC-Pre Latest Units: L 3.96 3.70 3.84  FVC-%Pred-Pre Latest Units: % 90 85 89   Results for Troy Miller, Troy Miller (MRN [id number]) as of 05/12/2017 17:06  Ref. 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   reports that he quit smoking about 31 years ago. His smoking use included cigarettes. He has a 50.00 pack-year smoking history. he has never used smokeless tobacco.  Past Surgical History:  Procedure Laterality Date  . BACK SURGERY  2003  . CARDIAC ELECTROPHYSIOLOGY STUDY AND ABLATION  03/19/10  . CARDIOVERSION  08/16/08  . CHOLECYSTECTOMY  08/13/02  . CORONARY ARTERY BYPASS GRAFT  1994 & 2005  . FEMORAL-TIBIAL BYPASS GRAFT     [place]  . HERNIA REPAIR  06/03/07  . VASECTOMY  01/28/08    Allergies  Allergen Reactions  . Fentanyl And Related Other (See Comments)    Hallucinations    Immunization History  Administered Date(s) Administered  . Influenza Split 04/20/2014  . Influenza, High Dose Seasonal PF 05/17/2016  . Influenza,inj,Quad PF,6+ Mos 03/10/2015  . Influenza-Unspecified 06/02/2012  . Pneumococcal Conjugate-13 02/23/2016    Family History  Problem Relation Age of Onset  . Lung cancer Mother        smoked  . Heart failure Father   . Stroke Sister      Current Outpatient Medications:  .  acetaminophen (TYLENOL) 500 MG tablet, Take 1,000 mg by mouth daily as needed for moderate pain or headache.,  Disp: , Rfl:  .  aspirin 81 MG tablet, Take 81 mg by mouth daily., Disp: , Rfl:  .  calcium carbonate (OS-CAL) 600 MG TABS, Take 600 mg by mouth 2 (two) times daily with a meal., Disp: , Rfl:  .  Cholecalciferol (VITAMIN D) 2000 units CAPS, Take 2,000 Units by mouth daily., Disp: , Rfl:  .  famotidine (PEPCID) 20 MG tablet, TAKE 1 TABLET BY MOUTH EVERYDAY AT BEDTIME, Disp: 90 tablet, Rfl: 0 .  folic acid (FOLVITE) 1 MG tablet, Take 1 mg by mouth every evening. , Disp: , Rfl:  .  niacin (NIASPAN) 500 MG CR tablet, Take  2,000 mg by mouth every evening. , Disp: , Rfl:  .  pantoprazole (PROTONIX) 40 MG tablet, TAKE 1 TABLET (40 MG TOTAL) BY MOUTH DAILY. TAKE 30-60 MIN BEFORE FIRST MEAL OF THE DAY, Disp: 90 tablet, Rfl: 0 .  Pirfenidone (ESBRIET) 267 MG CAPS, Take 3 capsules by mouth 3 (three) times daily., Disp: , Rfl:  .  rivaroxaban (XARELTO) 20 MG TABS tablet, Take 1 tablet (20 mg total) by mouth every morning., Disp: 30 tablet, Rfl: 1 .  simvastatin (ZOCOR) 20 MG tablet, TAKE 1 TABLET BY MOUTH EVERY EVENING, Disp: 30 tablet, Rfl: 6 .  valsartan-hydrochlorothiazide (DIOVAN-HCT) 80-12.5 MG tablet, TAKE 1 TABLET BY MOUTH DAILY., Disp: 30 tablet, Rfl: 6    Review of Systems     Objective:   Physical Exam  Constitutional: He is oriented to person, place, and time. He appears well-developed and well-nourished. No distress.  HENT:  Head: Normocephalic and atraumatic.  Right Ear: External ear normal.  Left Ear: External ear normal.  Mouth/Throat: Oropharynx is clear and moist. No oropharyngeal exudate.  Eyes: Conjunctivae and EOM are normal. Pupils are equal, round, and reactive to light. Right eye exhibits no discharge. Left eye exhibits no discharge. No scleral icterus.  Neck: Normal range of motion. Neck supple. No JVD present. No tracheal deviation present. No thyromegaly present.  Cardiovascular: Normal rate, regular rhythm and intact distal pulses. Exam reveals no gallop and no friction rub.  No murmur heard. Pulmonary/Chest: Effort normal. No respiratory distress. He has no wheezes. He has rales. He exhibits no tenderness.  Abdominal: Soft. Bowel sounds are normal. He exhibits no distension and no mass. There is no tenderness. There is no rebound and no guarding.  Musculoskeletal: Normal range of motion. He exhibits no edema or tenderness.  Lymphadenopathy:    He has no cervical adenopathy.  Neurological: He is alert and oriented to person, place, and time. He has normal reflexes. No cranial nerve  deficit. Coordination normal.  Skin: Skin is warm and dry. No rash noted. He is not diaphoretic. No erythema. No pallor.  Psychiatric: He has a normal mood and affect. His behavior is normal. Judgment and thought content normal.  Nursing note and vitals reviewed.  Vitals:   05/12/17 1649  BP: 122/66  Pulse: (!) 49  SpO2: 94%  Weight: 200 lb 3.2 oz (90.8 kg)  Height: 6' (1.829 m)       Assessment:       ICD-10-CM   1. IPF (idiopathic pulmonary fibrosis) ([place]) J84.112   2. Therapeutic drug monitoring Z51.81   3. Nodule of left lung R91.1        Plan:      Clinically stable  Plan - check LFT and bmet 05/12/2017 - do LFT December 2018 and jan 2019 with PCP [name], PA-C for Liver test  - any  abnormality they should call us - continue esbriet  - if liver test 05/12/2017 stable, then we will do the full dose version of 1 pill three times daily - in 3 months do HRCT - in 3 months do Pre-bd spiro and dlco only. No lung volume or bd response. No post-bd spiro  Followup In 3 months with Dr Chase Caller but after HRCT and PFT    Dr. Brand Males, M.D., [facility].C.P Pulmonary and Critical Care Medicine Staff Physician, [place] Director - Interstitial Lung Disease  Pulmonary [place] at [facility], [place], [address]  Pager: [phone], If no answer or between  15:00h - 7:00h: call [phone] Telephone: [phone]

## 2017-05-13 LAB — HEPATIC FUNCTION PANEL
ALT: 9 U/L (ref 0–53)
AST: 14 U/L (ref 0–37)
Albumin: 4.3 g/dL (ref 3.5–5.2)
Alkaline Phosphatase: 63 U/L (ref 39–117)
BILIRUBIN TOTAL: 0.5 mg/dL (ref 0.2–1.2)
Bilirubin, Direct: 0.1 mg/dL (ref 0.0–0.3)
TOTAL PROTEIN: 7.8 g/dL (ref 6.0–8.3)

## 2017-05-16 ENCOUNTER — Telehealth: Payer: Self-pay | Admitting: Internal Medicine

## 2017-05-16 NOTE — Telephone Encounter (Signed)
Sending letter to pt due to unable to get through to pt based off the phone numbers we have on file. Have been trying to tell pt the results of his LFT labwork but get a busy signal each time try to call pt.

## 2017-05-19 ENCOUNTER — Telehealth: Payer: Self-pay | Admitting: Cardiovascular Disease

## 2017-05-19 ENCOUNTER — Telehealth: Payer: Self-pay

## 2017-05-19 NOTE — Telephone Encounter (Signed)
Left detailed message on pt VM, okay per DPR, to call pharmacy to discuss getting a decreased amount of pill for less cost as we do not have any Xarelto 57m samples in the office to give him at this time.

## 2017-05-19 NOTE — Telephone Encounter (Signed)
New Message     Patient calling the office for samples of medication:   1.  What medication and dosage are you requesting samples for?   Xarelto 20 mg  2.  Are you currently out of this medication?  Yes

## 2017-05-19 NOTE — Telephone Encounter (Signed)
[facility] office called to request Xarelto 20 mg samples for patient. He will come tomorrow morning to pick up samples at check-in.

## 2017-05-20 ENCOUNTER — Telehealth: Payer: Self-pay | Admitting: Internal Medicine

## 2017-05-20 ENCOUNTER — Ambulatory Visit (INDEPENDENT_AMBULATORY_CARE_PROVIDER_SITE_OTHER)
Admission: RE | Admit: 2017-05-20 | Discharge: 2017-05-20 | Disposition: A | Discharge status: Home / Self Care | Payer: Medicare Other | Source: Ambulatory Visit | Attending: Internal Medicine | Admitting: Internal Medicine

## 2017-05-20 ENCOUNTER — Telehealth: Payer: Self-pay | Admitting: Cardiovascular Disease

## 2017-05-20 DIAGNOSIS — J84112 Idiopathic pulmonary fibrosis: Secondary | ICD-10-CM | POA: Diagnosis not present

## 2017-05-20 NOTE — Telephone Encounter (Signed)
New message     Pt is asking for a call back from [facility] about his assistance for medication. Please call.

## 2017-05-20 NOTE — Telephone Encounter (Signed)
Stacey at [facility] imaging calling with call report on pt's CT from today.  Impression copied below, full report available in Epic.    IMPRESSION: 1. Interval growth of solid 1.4 cm right lower lobe pulmonary nodule. Primary bronchogenic malignancy is a consideration. Consider PET-CT, tissue sampling or short-term follow-up chest CT in 3 months, as clinically warranted. 2. 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 3. 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  MR please advise.  Thanks.

## 2017-05-21 NOTE — Telephone Encounter (Signed)
Let Troy Miller know that there is growth of prior nodule from 81m to 1.4cm and is concerning for cancer but not sure  Plan  - do PET scan  - move the PFT (Pre-bd spiro and dlco only. No lung volume or bd response. No post-bd spiro) to ASAP - return to see me but ater above; please let me know dates for his PET and return  Thanks  Dr. [name] M.D., FSteele Memorial Medical CenterC.P Pulmonary and Critical Care Medicine Staff Physician CLake of the WoodsPulmonary and Critical Care Pager: [phone] If no answer or between  15:00h - 7:00h: call [phone]  05/21/2017 9:50 AM       Ct Chest High Resolution  Result Date: 05/20/2017 CLINICAL DATA:  Follow-up idiopathic pulmonary fibrosis. EXAM: CT CHEST WITHOUT CONTRAST TECHNIQUE: Multidetector CT imaging of the chest was performed following the standard protocol without intravenous contrast. High resolution imaging of the lungs, as well as inspiratory and expiratory imaging, was performed. COMPARISON:  11/18/2016 chest CT. FINDINGS: Cardiovascular: Top-normal heart size. No significant pericardial fluid/thickening. Left main and 3 vessel coronary atherosclerosis status post CABG. Atherosclerotic nonaneurysmal thoracic aorta. Stable borderline enlarged main pulmonary artery (3.4 cm diameter). Mediastinum/Nodes: No discrete thyroid nodules. Unremarkable esophagus. No pathologically enlarged axillary, mediastinal or gross hilar lymph nodes, noting limited sensitivity for the detection of hilar adenopathy on this noncontrast study. Lungs/Pleura: No pneumothorax. Stable smooth pleural thickening and calcified pleural plaques bilaterally, most prominent in the dependent pleural spaces. No pleural effusions. Moderate centrilobular and paraseptal emphysema with mild diffuse bronchial wall thickening. Dominant posterior right lower lobe 1.4 x 1.1 cm solid pulmonary nodule (series 3/ image 110), previously 1.2 x 0.6 cm on 11/18/2016, increased. A  few scattered solid pulmonary nodules in the left lung measuring up to the 9 mm in the posterior apical left upper lobe (series 3/ image 28 and 7 mm in the posterior left lower lobe (series 3/ image 121) are all stable back to 02/06/2015 and considered benign. No new significant pulmonary nodules. There is patchy subpleural reticulation and ground-glass attenuation throughout both lungs with a basilar predominance, with associated mild traction bronchiectasis, architectural distortion and scattered parenchymal bands. Minimal early honeycombing in the dependent basilar lower lobes (series 6/image 17). These findings have not appreciably progressed compared since 11/08/2016 chest CT and are mildly progressed compared 02/06/2015 chest CT. Upper abdomen: Unremarkable. Musculoskeletal: No aggressive appearing focal osseous lesions. Marked thoracic spondylosis. Intact sternotomy wires. IMPRESSION: 1. Interval growth of solid 1.4 cm right lower lobe pulmonary nodule. Primary bronchogenic malignancy is a consideration. Consider PET-CT, tissue sampling or short-term follow-up chest CT in 3 months, as clinically warranted. 2. 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 3. 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 Aortic Atherosclerosis (ICD10-I70.0) and Emphysema (ICD10-J43.9). These results will be called to the ordering clinician or representative by the Radiologist Assistant, and communication documented in the PACS or zVision Dashboard. Electronically Signed   By: JIlona SorrelM.D.   On: 05/20/2017 16:17

## 2017-05-21 NOTE — Telephone Encounter (Signed)
I called pt X 3 and it keeps ringing busy. Will route to [place] since the results warrants to speak with the patient asap.

## 2017-05-22 NOTE — Telephone Encounter (Signed)
Tried to call pt but number still just rings busy. Will try again later and if unable to reach pt, will mail a letter to address we have on file.

## 2017-05-22 NOTE — Telephone Encounter (Signed)
Returned call to patient, patient wondering the status of patient assistance on Xarelto.  Provided number for Xarelto to get update.   Advised patient to let me know if denied/approved or if needed to be re faxed.  Patient verbalized understanding.

## 2017-05-23 ENCOUNTER — Telehealth: Payer: Self-pay | Admitting: Cardiovascular Disease

## 2017-05-23 NOTE — Telephone Encounter (Signed)
New message    Patient calling for assistance with Troy Miller and [place] information. Please call  Patient calling the office for samples of medication:   1.  What medication and dosage are you requesting samples for? rivaroxaban (XARELTO) 20 MG TABS tablet  2.  Are you currently out of this medication? Will be out of medication by Tuesday

## 2017-05-26 MED ORDER — RIVAROXABAN 20 MG PO TABS: 20.0000 mg | ORAL_TABLET | Freq: Every morning | ORAL | 0 refills | Status: DC

## 2017-05-26 NOTE — Telephone Encounter (Signed)
Will mail a letter to pt's address we have on file.

## 2017-05-26 NOTE — Telephone Encounter (Signed)
Patient aware samples are at the front desk for pick up

## 2017-06-02 ENCOUNTER — Telehealth: Payer: Self-pay | Admitting: Internal Medicine

## 2017-06-02 NOTE — Telephone Encounter (Signed)
Spoke with pt, and advised lab work with PFT results. Pt understood and his number is correct in Epic. Nothing further is needed.

## 2017-06-03 ENCOUNTER — Telehealth: Payer: Self-pay | Admitting: Internal Medicine

## 2017-06-03 DIAGNOSIS — R911 Solitary pulmonary nodule: Secondary | ICD-10-CM

## 2017-06-03 NOTE — Telephone Encounter (Signed)
Raquel Sarna  When is his PET scan and followup?  Dr. Brand Males, M.D., [facility].C.P Pulmonary and Critical Care Medicine Staff Physician, [place] Director - Interstitial Lung Disease  Program  Pulmonary [place] at [place], [place], [address]  Pager: [phone], If no answer or between  15:00h - 7:00h: call [phone] Telephone: [phone]

## 2017-06-03 NOTE — Telephone Encounter (Signed)
It appears that PET and PFT were not scheduled, due to not being able to get in touch with pt. I have spoken with pt and made him aware of below message.  PET has been order.  PFT and rov have been scheduled for 06/26/17  Will route to MR to make aware.   Brand Males, MD    9:48 AM  Note    Let Gerda Diss Smucker know that there is growth of prior nodule from 58m to 1.4cm and is concerning for cancer but not sure  Plan  - do PET scan  - move the PFT (Pre-bd spiro and dlco only. No lung volume or bd response. No post-bd spiro) to ASAP - return to see me but ater above; please let me know dates for his PET and return  Thanks  Dr. [name] M.D., FNovamed [facility] SurgeryC.P Pulmonary and Critical Care Medicine Staff Physician CRhomePulmonary and Critical Care Pager: [phone] If no answer or between  15:00h - 7:00h: call [phone]  05/21/2017 9:50 AM

## 2017-06-18 ENCOUNTER — Telehealth: Payer: Self-pay | Admitting: Cardiovascular Disease

## 2017-06-18 ENCOUNTER — Ambulatory Visit (HOSPITAL_COMMUNITY)
Admission: RE | Admit: 2017-06-18 | Discharge: 2017-06-18 | Disposition: A | Discharge status: Home / Self Care | Payer: Medicare Other | Source: Ambulatory Visit | Attending: Internal Medicine | Admitting: Internal Medicine

## 2017-06-18 DIAGNOSIS — Z7982 Long term (current) use of aspirin: Secondary | ICD-10-CM | POA: Diagnosis not present

## 2017-06-18 DIAGNOSIS — R911 Solitary pulmonary nodule: Secondary | ICD-10-CM | POA: Diagnosis present

## 2017-06-18 DIAGNOSIS — M47896 Other spondylosis, lumbar region: Secondary | ICD-10-CM | POA: Insufficient documentation

## 2017-06-18 DIAGNOSIS — K4090 Unilateral inguinal hernia, without obstruction or gangrene, not specified as recurrent: Secondary | ICD-10-CM | POA: Insufficient documentation

## 2017-06-18 DIAGNOSIS — J849 Interstitial pulmonary disease, unspecified: Secondary | ICD-10-CM | POA: Diagnosis not present

## 2017-06-18 DIAGNOSIS — J439 Emphysema, unspecified: Secondary | ICD-10-CM | POA: Insufficient documentation

## 2017-06-18 DIAGNOSIS — Z77090 Contact with and (suspected) exposure to asbestos: Secondary | ICD-10-CM | POA: Insufficient documentation

## 2017-06-18 DIAGNOSIS — I517 Cardiomegaly: Secondary | ICD-10-CM | POA: Insufficient documentation

## 2017-06-18 DIAGNOSIS — Z79899 Other long term (current) drug therapy: Secondary | ICD-10-CM | POA: Insufficient documentation

## 2017-06-18 DIAGNOSIS — I2510 Atherosclerotic heart disease of native coronary artery without angina pectoris: Secondary | ICD-10-CM | POA: Insufficient documentation

## 2017-06-18 DIAGNOSIS — J328 Other chronic sinusitis: Secondary | ICD-10-CM | POA: Insufficient documentation

## 2017-06-18 DIAGNOSIS — M47892 Other spondylosis, cervical region: Secondary | ICD-10-CM | POA: Insufficient documentation

## 2017-06-18 DIAGNOSIS — I714 Abdominal aortic aneurysm, without rupture: Secondary | ICD-10-CM | POA: Diagnosis not present

## 2017-06-18 DIAGNOSIS — K5730 Diverticulosis of large intestine without perforation or abscess without bleeding: Secondary | ICD-10-CM | POA: Diagnosis not present

## 2017-06-18 DIAGNOSIS — I700 Atherosclerosis of aorta: Secondary | ICD-10-CM | POA: Insufficient documentation

## 2017-06-18 DIAGNOSIS — M47894 Other spondylosis, thoracic region: Secondary | ICD-10-CM | POA: Insufficient documentation

## 2017-06-18 DIAGNOSIS — Z7901 Long term (current) use of anticoagulants: Secondary | ICD-10-CM | POA: Insufficient documentation

## 2017-06-18 LAB — GLUCOSE, CAPILLARY: GLUCOSE-CAPILLARY: 102 mg/dL — AB (ref 65–99)

## 2017-06-18 MED ORDER — FLUDEOXYGLUCOSE F - 18 (FDG) INJECTION
10.2000 | Freq: Once | INTRAVENOUS | Status: AC | PRN
  Administered 2017-06-18: 10.2 via INTRAVENOUS

## 2017-06-18 NOTE — Telephone Encounter (Signed)
Xarelto 20 mg # 2 lot [id number] exp 3/21  Advised patient at front desk for pick up

## 2017-06-18 NOTE — Telephone Encounter (Signed)
New message ° ° ° °Patient calling the office for samples of medication: ° ° °1.  What medication and dosage are you requesting samples for?rivaroxaban (XARELTO) 20 MG TABS tablet ° °2.  Are you currently out of this medication? yes ° ° °

## 2017-06-20 ENCOUNTER — Telehealth: Payer: Self-pay | Admitting: Internal Medicine

## 2017-06-20 NOTE — Telephone Encounter (Signed)
lft 06/13/17 normal. Will inform him at followup   Dr. Brand Males, M.D., [facility].C.P Pulmonary and Critical Care Medicine Staff Physician, [place] Director - Interstitial Lung Disease  Program  Pulmonary [place] at [place], [place], [address]  Pager: [phone], If no answer or between  15:00h - 7:00h: call [phone] Telephone: [phone]

## 2017-06-26 ENCOUNTER — Ambulatory Visit: Payer: Medicare Other | Admitting: Internal Medicine

## 2017-06-26 NOTE — Progress Notes (Signed)
Letter sent for results.

## 2017-06-26 NOTE — Telephone Encounter (Signed)
08/01/17 is too far out. Can he be seen by [facility] any sooner?  Dr. Brand Males, M.D., [facility].C.P Pulmonary and Critical Care Medicine Staff Physician, [employer] Director - Interstitial Lung Disease  Program  Pulmonary [place] at [place], [place], [address]  Pager: [phone], If no answer or between  15:00h - 7:00h: call [phone] Telephone: [phone]

## 2017-06-27 NOTE — Telephone Encounter (Signed)
Since pt has never seen Dr. Lamonte Sakai, we would have to do a 48mn appt and first time Dr. BLamonte Sakaicould get him in for a 355m appt would not be until 07/31/17.  Do you want him to keep the OV that is scheduled with you on 08/01/17 or have him see a NP sooner?

## 2017-07-03 ENCOUNTER — Telehealth: Payer: Self-pay | Admitting: Cardiovascular Disease

## 2017-07-03 NOTE — Telephone Encounter (Signed)
Patient calling the office for samples of medication:   1.  What medication and dosage are you requesting samples for? Xarelto 20 mg  2.  Are you currently out of this medication? yes

## 2017-07-03 NOTE — Telephone Encounter (Signed)
Medication samples have been provided to the patient.  Drug name: xarelto 28m  Qty: 14 tabs (2 bottles)  LOT: [id number] Exp.Date: 3/21  Samples left at front desk for patient pick-up. Patient notified.  ESheral ApleyM 2:13 PM 07/03/2017

## 2017-07-16 NOTE — Telephone Encounter (Signed)
D./w By[facility]: he thinks if PFT good patient should consider direct surgical resection. So, I will see him 07/31/17 and then decide. FYI  Dr. Brand Males, M.D., F.C.C.P Pulmonary and Critical Care Medicine Staff Physician, [place] Director - Interstitial Lung Disease  Program  Pulmonary [place] at [place], [place], [address]  Pager: [phone], If no answer or between  15:00h - 7:00h: call [phone] Telephone: [phone]

## 2017-07-18 ENCOUNTER — Telehealth: Payer: Self-pay | Admitting: Cardiovascular Disease

## 2017-07-18 NOTE — Telephone Encounter (Signed)
Patient made aware that samples are available at the front.   Medication Samples have been provided to the patient.  Drug name: Xarelto       Strength: 20 mg         Qty: 2 bottles  LOT: [id number]  Exp.Date: 3/21

## 2017-07-18 NOTE — Telephone Encounter (Signed)
New Message      Patient calling the office for samples of medication:   1.  What medication and dosage are you requesting samples for? Xarelto 72m  2.  Are you currently out of this medication?  Has 3 days left

## 2017-07-22 ENCOUNTER — Other Ambulatory Visit: Payer: Self-pay | Admitting: Cardiovascular Disease

## 2017-07-24 ENCOUNTER — Telehealth: Payer: Self-pay | Admitting: Internal Medicine

## 2017-07-24 NOTE — Telephone Encounter (Signed)
PA request received from [employer]. Initiated via [email or web address] Key: [id number] PA sent to plan, determination expected within 72 hours. Routing to EP for follow up.

## 2017-07-25 NOTE — Telephone Encounter (Signed)
PA was approved through covermymeds. [id number] will let patient and pharmacy know.

## 2017-07-25 NOTE — Telephone Encounter (Signed)
Patient and pharmacy are aware that esbriet has ben approved.

## 2017-08-01 ENCOUNTER — Other Ambulatory Visit: Payer: Self-pay | Admitting: Cardiovascular Disease

## 2017-08-01 ENCOUNTER — Encounter: Payer: Self-pay | Admitting: Internal Medicine

## 2017-08-01 ENCOUNTER — Telehealth: Payer: Self-pay | Admitting: *Deleted

## 2017-08-01 ENCOUNTER — Ambulatory Visit (INDEPENDENT_AMBULATORY_CARE_PROVIDER_SITE_OTHER): Payer: Medicare Other | Admitting: Internal Medicine

## 2017-08-01 VITALS — BP 132/74 | HR 55 | Ht 71.0 in | Wt 199.6 lb

## 2017-08-01 DIAGNOSIS — Z5181 Encounter for therapeutic drug level monitoring: Secondary | ICD-10-CM | POA: Diagnosis not present

## 2017-08-01 DIAGNOSIS — J84112 Idiopathic pulmonary fibrosis: Secondary | ICD-10-CM

## 2017-08-01 DIAGNOSIS — R911 Solitary pulmonary nodule: Secondary | ICD-10-CM

## 2017-08-01 NOTE — Progress Notes (Signed)
PFT before and DLCO completed today 08/01/17

## 2017-08-01 NOTE — Progress Notes (Signed)
Subjective:     Patient ID: Troy Miller, male   DOB: Oct 27, 1940, 77 y.o.   MRN: [id number]  HPI   OV 05/12/2017  Chief Complaint  Patient presents with  . Follow-up    Pt states that he is doing good. C/o SOB with exertion, occ. cough with clear to white mucus. Denies any CP.   Follow-up idiopathic pulmonary fibrosis started on Pirfenidone (Esbriet) and June 2018.  Follow left lower lobe lung nodule 8 mm in February 2018   He is here for routine follow-up. Last visit he did see my colleague and noticed some increased shortness of breath but currently this is settled. He is on full dose Pirfenidone (Esbriet) and is tolerating it well without any problems. He is interested in enrolling into the 1 pill format. He denies any worsening shortness of breath or cough that he does have shortness of breath with exertion relieved by rest but this is stable. His most recent liver function test was in September 2018 and normal. His creatinine was slightly elevated at 1.2 mg percent at that time. He is interested in research protocols in the future. He lives one hour away and it would be easier for him to have liver function tests monitoring locally he's not had a follow-up CT scan of the chest for his left lower lobe nodule noted in August 2080 needed have a CT angiogram that showed his lung nodule is stable compared to February 2018.    OV 08/01/2017  Chief Complaint  Patient presents with  . Follow-up    HRCT done 11/13, PET scan done 12/12, and PFT done today. Pt is currently on Esbriet. Pt states he has been doing about the same as last visit.  Occ. cough, SOB with exertion. Denies any CP.     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 Followup RLL lung nodule  In terms of his IPF: He is doing well clinically.  He says he is stable.  However when we walked him he desaturated on the second lap.  In fact pulmonary function test shows a  decline.  But he is not feeling it.  He does have portable oxygen with him at home and he does not use it.  Liver function tests in January 2019 on the seventh was normal.  He did this outside with his primary care physician and he brought the results with me for review.  At this point in time he finished 6 months of liver function test.  After this he will have liver function test every 3 months  Lung nodule: His CT scan recently showed right lower lobe lung nodule.  He had a follow-up PET scan and this shows the lung nodule to be PET heart.  I visualized the scan with Dr. Baltazar Apo and it is too far for bronchoscopy means.  The pretest probability for this being early stage non-small cell lung cancer is extremely high over 95% in my view.      Results for DEMONTRE, PADIN (MRN [id number]) as of 08/01/2017 11:19  Ref. Range 04/14/2015 11:47 05/03/2016 12:52 12/09/2016 15:22 08/01/2017 10:04  FVC-Pre Latest Units: L 3.96 3.70 3.84 3.53  FVC-%Pred-Pre Latest Units: % 90 85 89 82   Results for NIKKI, RUSNAK (MRN [id number]) as of 08/01/2017 11:19  Ref. Range 04/14/2015 11:47 05/03/2016 12:52 12/09/2016 15:22 08/01/2017 10:04  DLCO unc Latest Units: ml/min/mmHg 13.79 12.45 11.70 10.88  DLCO unc % pred Latest Units: %  40 36 34 32    Walking desaturation test on 08/01/2017 185 feet x 3 laps on ROOM AIR:  did YES desaturate. Rest pulse ox was 98%, final pulse ox was 85 at% at 2nd lap and stopped and started on 2L Hernandez an stayed at 96% for remainder. HR response 50/min at rest to 90 /min at peak exertion at 2nd lap when desaturated. Patient Rithik A Cowie  Yes did Desaturate < 88% . Josua A Coate yes  Desaturated </= 3% points. Jiovanni A Felch yes did get tachyardic  Review of Systems     Objective:   Physical Exam  Constitutional: He is oriented to person, place, and time. He appears well-developed and well-nourished. No distress.  HENT:  Head: Normocephalic and atraumatic.  Right Ear:  External ear normal.  Left Ear: External ear normal.  Mouth/Throat: Oropharynx is clear and moist. No oropharyngeal exudate.  Eyes: Conjunctivae and EOM are normal. Pupils are equal, round, and reactive to light. Right eye exhibits no discharge. Left eye exhibits no discharge. No scleral icterus.  Neck: Normal range of motion. Neck supple. No JVD present. No tracheal deviation present. No thyromegaly present.  Cardiovascular: Normal rate, regular rhythm and intact distal pulses. Exam reveals no gallop and no friction rub.  No murmur heard. Pulmonary/Chest: Effort normal. No respiratory distress. He has no wheezes. He has rales. He exhibits no tenderness.  Abdominal: Soft. Bowel sounds are normal. He exhibits no distension and no mass. There is no tenderness. There is no rebound and no guarding.  Musculoskeletal: Normal range of motion. He exhibits no edema or tenderness.  Lymphadenopathy:    He has no cervical adenopathy.  Neurological: He is alert and oriented to person, place, and time. He has normal reflexes. No cranial nerve deficit. Coordination normal.  Skin: Skin is warm and dry. No rash noted. He is not diaphoretic. No erythema. No pallor.  Psychiatric: He has a normal mood and affect. His behavior is normal. Judgment and thought content normal.  Nursing note and vitals reviewed.   Vitals:   08/01/17 1059  BP: 132/74  Pulse: (!) 55  SpO2: 98%  Weight: 199 lb 9.6 oz (90.5 kg)  Height: _0  (1.803 m)    Estimated body mass index is 27.84 kg/m as calculated from the following:   Height as of this encounter: _1  (1.803 m).   Weight as of this encounter: 199 lb 9.6 oz (90.5 kg).      Assessment:       ICD-10-CM   1. IPF (idiopathic pulmonary fibrosis) ([place]) J84.112   2. Therapeutic drug monitoring Z51.81   3. Right lower lobe pulmonary nodule R91.1        Plan:     IPF (idiopathic pulmonary fibrosis) (HCC) Therapeutic drug monitoring  - IPF is progressive  slowly and LFT normal Jan 2019  - for now continue esbriet; in future we can discuss switch to ofev (holding off ofev for now due to blood thinning theoretical effect and possible upcoming surgery) - use o2 with exertion    Right lower lobe pulmonary nodule - new Right lower lobe PET hot nodule - very concerning for early stage lung cancer  Plan  - appreciate you enrolling in [employer] lung nodule blood test study (discussed this in detail)  - the nodule is too distant to biopsy by bronchoscopy - refer [facility] to be seen by radiation oncology and surgery  - my personal bias is against radiation due to  some personal experience on patients where the fibrosis starts getting worse   - however, will get opinion from both specialists  FOllowup = return to see me in 3 months or sooner if needed;     > 50% of this > 25 min visit spent in face to face counseling or coordination of care    Dr. Brand Males, M.D., [facility].C.P Pulmonary and Critical Care Medicine Staff Physician, [place] Director - Interstitial Lung Disease  Program  Pulmonary [place] at [place], [place], [address]  Pager: [phone], If no answer or between  15:00h - 7:00h: call [phone] Telephone: [phone]

## 2017-08-01 NOTE — Telephone Encounter (Signed)
Oncology Nurse Navigator Documentation  Oncology Nurse Navigator Flowsheets 08/01/2017  Navigator Location [facility]  Referral date to RadOnc/MedOnc 08/01/2017  Navigator Encounter Type Telephone/I received referral on Troy Miller today. I called him to give him an appt. I was unable to reach and left vm message for him to call with my name and phone number.   Telephone Outgoing Call  Treatment Phase Pre-Tx/Tx Discussion  Barriers/Navigation Needs Coordination of Care  Interventions Coordination of Care  Coordination of Care Other  Acuity Level 2  Time Spent with Patient 30

## 2017-08-01 NOTE — Progress Notes (Signed)
.[email or web address]  Title: Blood Sample Collection in Subjects with Pulmonary Nodules or CT Suspicion of Lung  Cancer  Study Number: 2016-01; Protocol: Version 4.0, Amendment 3.0 Date: 06Jun2017  Sponsor: [employer] [address] [place] [address]  Principal Investigator: Dr. Marshell Garfinkel ; Sub Investigators: Dr. Brand Males, Dr. Simonne Maffucci  Synopsis: This is multi-site, sample collection study.The study is to obtain de-identified, clinically characterized, whole blood specimens for use in assessing new biomarkers for the detection of neoplasms off the lung.  The study will sample blood (14m) from approximately 2250 subject; 1000 will have CT suspicion of lung cancer which is ultimately diagnosed as lung cancer and approximately 1000 subjects will have pulmonary nodules greater than or equal to 4 mm .  Total Number of Subjects in Trial: 2250   CT suspicion of lung Cancer ultimately diagnosed  CT suspicion of lung Cancer ultimately benign Pulmonary  Nodules Greater than/equal to 4 mm  Pulmonary Nodules Greater than/equal to 15 mm Pulmonary Nodules Greater than/equal to 10-15 mm  Greater than/equal to 4-9 mm        Number of Subjects 1000 250 1000 100 100 Remaining      Key Inclusion:  Suspicion of Cancer Subjects:  Subject is male or male, [age]years of age, inclusive  Subject has CT suspicion of lung cancer and is scheduled for biopsy or other diagnostic procedure.  Pulmonary Nodule Subjects:  Subject is male or male, 3472years of age, inclusive  Subject has a recent (within 90 days of enrollment) CT radiological diagnosis of pulmonary nodule(s) greater than equal to 4 mm without a scheduled biopsy/diagnostic procedure.   All Subjects:  Subject understands the study procedures and is able to provide informed consent to participate in the study and authorization for release of relevant protected health information to the study investigator.  Key  Exclusion:  CT with IV contrast within 1 day (or 24 hours) of blood collection  Prior history of cancer with the exception of non melanoma skin cancer and most in-situ carcinomas.  Prior removal of the lung, excluding percutaneous lung biopsy.  Any cytotoxic therapy including chemotherapy and radiation therapy.           Key Features: Lung cancer is now the most common cause of cancer death among men and women.  Key Endpoints: Using whole blood specimens for use in assessing new biomarkers for detection of neoplasms of the lung and for a biorepository for future cancer-related diagnostic test development.  Safety of [employer]: Routine phlebotomy risks of minimal transient pain and possible hematoma formation  Fleischner Society Guidelines for incidental pulmonary nodule follow up:   Nodule Size(mm) Low- Risk Patient High Risk Patient  ? 4 No Follow-up Needed Follow-up at 12 mo; if unchanged,no further follow up   > 4-6 Follow-up CT at 12 months;   if unchanged, no further follow up  Initial follow up CT at 6-12 mo;then at 18-24 months if no change  > 6-8  Initial follow-up CT at 6-12 months then at 18-24 months if no change       Initial follow-up CT at 3-625mothen at 9-12 and 24 months if no change  > 8 Follow-up CT at around 3, 9, and 24 months, dynamic contrast- enhanced CT, PET, and/or biopsy Same as for low-risk patient     Clinical Research Coordinator / Research RN note : This visit for Subject Troy Miller with DOB: 7/11-Jun-1942n 08/01/2017 for the above protocol is Visit/Encounter #1 consent  and enrollment and is for purpose of research . The consent for this encounter is under Protocol Version 5.0 and is currently IRB approved.the subject expressed  interest and consent in continuing as a study subject. Subject confirmed that there was  no change in contact information (e.g. address, telephone, email). Subject thanked for participation in research and contribution to science.    In this visit 08/01/2017 the subject will be evaluated by investigator named Dr. Chase Caller  . This research coordinator has verified that the investigator is up to date with his training logs  Additional details (CRC to choose 1 and delete the other) 1. Because this visit is a key visit of screening/enrollment this visit is under direct supervision of the SubI Dr. Chase Caller. This subI is  available for this visit   Signed by [place], [place] 12:52 PM 08/01/2017

## 2017-08-01 NOTE — Telephone Encounter (Signed)
Patient calling the office for samples of medication:   1.  What medication and dosage are you requesting samples for? Xarelto  2.  Are you currently out of this medication?  1 left

## 2017-08-01 NOTE — Telephone Encounter (Signed)
Medication samples have been provided to the patient.  Drug name: xarelto 42m  Qty: 3 bottles  LOT: [id number] Exp.Date: 3/21  Samples left at front desk for patient pick-up. Patient notified.  EFidel Levy9:18 AM 08/01/2017

## 2017-08-01 NOTE — Patient Instructions (Addendum)
IPF (idiopathic pulmonary fibrosis) (HCC) Therapeutic drug monitoring  - IPF is progressive slowly and LFT normal Jan 2019  - for now continue esbriet; in future we can discuss switch to ofev (holding off ofev for now due to blood thinning theoretical effect and possible upcoming surgery) - use o2 with exertion    Right lower lobe pulmonary nodule - new Right lower lobe PET hot nodule - very concerning for early stage lung cancer  Plan  - appreciate you enrolling in [employer] lung nodule blood test study  - the nodule is too distant to biopsy by bronchoscopy - refer [facility] to be seen by radiation oncology and surgery  - my personal bias is against radiation due to some personal experience on patients where the fibrosis starts getting worse   - however, will get iopinion from both specialists  FOllowup = return to see me in 3 months or sooner if needed;   - lft check in 3 month

## 2017-08-04 ENCOUNTER — Telehealth: Payer: Self-pay | Admitting: *Deleted

## 2017-08-04 NOTE — Telephone Encounter (Signed)
Oncology Nurse Navigator Documentation  Oncology Nurse Navigator Flowsheets 08/04/2017  Navigator Location [facility]  Navigator Encounter Type Telephone/I received referral on Troy Miller. I called to schedule him to be seen at [facility] on 08/14/17.  He verbalized understanding of appt time and place.   Telephone Outgoing Call  Treatment Phase Pre-Tx/Tx Discussion  Barriers/Navigation Needs Coordination of Care  Interventions Coordination of Care  Coordination of Care Appts  Acuity Level 1  Time Spent with Patient 15

## 2017-08-13 ENCOUNTER — Telehealth: Payer: Self-pay | Admitting: Internal Medicine

## 2017-08-13 ENCOUNTER — Ambulatory Visit: Payer: Medicare Other | Admitting: Physical Therapy

## 2017-08-13 NOTE — Telephone Encounter (Signed)
Spoke with patient to confirm arrival time for [facility] appointment

## 2017-08-14 ENCOUNTER — Encounter: Payer: Self-pay | Admitting: General Practice

## 2017-08-14 ENCOUNTER — Telehealth: Payer: Self-pay | Admitting: *Deleted

## 2017-08-14 ENCOUNTER — Institutional Professional Consult (permissible substitution) (INDEPENDENT_AMBULATORY_CARE_PROVIDER_SITE_OTHER): Payer: Medicare Other | Admitting: Cardiothoracic Surgery

## 2017-08-14 ENCOUNTER — Ambulatory Visit: Payer: Medicare Other | Attending: Radiation Oncology | Admitting: Physical Therapy

## 2017-08-14 ENCOUNTER — Other Ambulatory Visit: Payer: Self-pay

## 2017-08-14 ENCOUNTER — Other Ambulatory Visit: Payer: Self-pay | Admitting: Pulmonary Disease

## 2017-08-14 ENCOUNTER — Ambulatory Visit
Admission: RE | Admit: 2017-08-14 | Discharge: 2017-08-14 | Disposition: A | Discharge status: Home / Self Care | Payer: Medicare Other | Source: Ambulatory Visit | Attending: Radiation Oncology | Admitting: Radiation Oncology

## 2017-08-14 DIAGNOSIS — D491 Neoplasm of unspecified behavior of respiratory system: Secondary | ICD-10-CM

## 2017-08-14 DIAGNOSIS — R918 Other nonspecific abnormal finding of lung field: Secondary | ICD-10-CM

## 2017-08-14 DIAGNOSIS — R911 Solitary pulmonary nodule: Secondary | ICD-10-CM

## 2017-08-14 DIAGNOSIS — R29898 Other symptoms and signs involving the musculoskeletal system: Secondary | ICD-10-CM | POA: Diagnosis present

## 2017-08-14 DIAGNOSIS — R293 Abnormal posture: Secondary | ICD-10-CM

## 2017-08-14 DIAGNOSIS — R262 Difficulty in walking, not elsewhere classified: Secondary | ICD-10-CM | POA: Insufficient documentation

## 2017-08-14 DIAGNOSIS — C3430 Malignant neoplasm of lower lobe, unspecified bronchus or lung: Secondary | ICD-10-CM | POA: Insufficient documentation

## 2017-08-14 NOTE — Progress Notes (Signed)
[facility] Psychosocial Distress Screening Clinical Social Work  Clinical Social Work was referred by distress screening protocol.  The patient scored a 1 on the Psychosocial Distress Thermometer which indicates mild distress. Clinical Social Worker Edwyna Shell to assess for distress and other psychosocial needs. Met w patient and caregiver in clinic.  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  Has support from family, no concerns re transportation or other issues.  No financial concerns expressed.  CSW provided information about [place] and encouraged patient to call as needed.    ONCBCN DISTRESS SCREENING 08/14/2017  Screening Type Initial Screening  Distress experienced in past week (1-10) 1  Physician notified of physical symptoms Yes  Referral to clinical psychology No  Referral to clinical social work No  Referral to dietition No  Referral to financial advocate No  Referral to support programs No  Referral to palliative care No    Clinical Social Worker follow up needed: No.  If yes, follow up plan:  Edwyna Shell, LCSW Clinical Social Worker Phone:  [phone]

## 2017-08-14 NOTE — Telephone Encounter (Signed)
-----  Message from Laury Deep, RN sent at 08/14/2017  4:41 PM EST ----- Marykay Lex,  I am Dr. Everrett Coombe nurse and we are seeing this mutual patient needing a biopsy for his lung nodule.  Patient is on Xarelto.  Is Dr. Claiborne Billings okay with [place] holding this prior?  If so, how many days?  Thanks,  [employer]

## 2017-08-14 NOTE — Progress Notes (Signed)
Radiation Oncology         [phone] ________________________________  Initial Outpatient Consultation  Name: Troy Miller MRN: [id number]  Date: 08/14/2017  DOB: 1940/11/14  ST:[id number], [name], MD   REFERRING PHYSICIAN: Brand Males, MD  DIAGNOSIS: Presumptive PET positive clinical stage I lung cancer presenting in the right lower lobe  HISTORY OF PRESENT ILLNESS::Troy Miller is a 77 y.o. male who presents today in the multidisciplinary lung clinic to discuss treatment options for the management of his disease at the request of Dr. Chase Caller. Pt states that the spot on his lung was discovered when he presented to  his cardiologist for SOB and he underwent an x-ray.The pt is a former smoker.   The pt underwent a CT to the chest on August 09, 2016 and it revealed, FINDINGS: 8 mm left lower lobe nodule is increased in size compared to the prior examination. 7 mm right lower lobe nodule is unchanged. In the midst of an area of architectural distortion in the right lower lobe there is a new large nodule with macrolobulated margins measuring 9 x 18 mm. He had another CT scan to the chest on 05/20/2017  that revealed, IMPRESSION: Interval growth of solid 1.4 cm right lower lobe pulmonary nodule. Primary bronchogenic malignancy is a consideration  Following a CT to the chest, the pt underwent a PET scan on 06/18/2017 that revealed, IMPRESSION: The right lower lobe pulmonary nodule of concern measuring 1.4 by 1.0 cm has a maximum SUV of 7.8, compatible with malignancy. No adenopathy or distant metastatic disease. 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 4.1 cm in diameter infrarenal abdominal aortic aneurysm. Recommend followup by ultrasound in 1 year.   Pt presents today with his fiance. Pt reports that he is on 3L of oxygen and uses it as needed.Pt states that he has had a bypass on his right knee due to a blood clot and  he also had a heart bypass. Pt reports that he lives about an hour from [place].  Pt reports that he is currently taking Esbriet. Pt states that he works part time and would prefer to have his treatments in the afternoon. Pt denies having any pain in his chest, difficulty raising his arms or hemoptysis.  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    PAST SURGICAL HISTORY: Past Surgical History:  Procedure Laterality Date  . ABDOMINAL AORTOGRAM W/LOWER EXTREMITY N/A 02/18/2017   Procedure: ABDOMINAL AORTOGRAM W/LOWER EXTREMITY;  Surgeon: Serafina Mitchell, MD;  Location: [facility];  Service: Cardiovascular;  Laterality: N/A;  . BACK SURGERY  2003  . CARDIAC ELECTROPHYSIOLOGY STUDY AND ABLATION  03/19/10  . CARDIOVERSION  08/16/08  . CHOLECYSTECTOMY  08/13/02  . CORONARY ARTERY BYPASS GRAFT  1994 & 2005  . FEMORAL-TIBIAL BYPASS GRAFT     Remote  . HERNIA REPAIR  06/03/07  . PERIPHERAL VASCULAR INTERVENTION Left 02/18/2017   Procedure: PERIPHERAL VASCULAR INTERVENTION;  Surgeon: Serafina Mitchell, MD;  Location: [facility];  Service: Cardiovascular;  Laterality: Left;  POPLITEAL  . VASECTOMY  01/28/08    FAMILY HISTORY:  family history includes Heart failure in his father; Lung cancer in his mother; Stroke in his sister.  SOCIAL HISTORY:  reports that he quit smoking about 31 years ago. His smoking use included cigarettes. He has a 50.00 pack-year smoking history. he has never used smokeless tobacco. He reports that he does not drink alcohol or use drugs.  ALLERGIES: Fentanyl and  related  MEDICATIONS:  Current Outpatient Medications  Medication Sig Dispense Refill  . acetaminophen (TYLENOL) 500 MG tablet Take 1,000 mg by mouth daily as needed for moderate pain or headache.    Marland Kitchen aspirin 81 MG tablet Take 81 mg by mouth daily.    . calcium carbonate (OS-CAL) 600 MG TABS Take 600 mg by mouth 2 (two) times daily with a meal.    . Cholecalciferol (VITAMIN D) 2000 units CAPS Take 2,000 Units by mouth daily.    . famotidine (PEPCID) 20 MG tablet TAKE 1 TABLET BY MOUTH EVERYDAY AT BEDTIME 90 tablet 0  . folic acid (FOLVITE) 1 MG tablet Take 1 mg by mouth every evening.     . niacin (NIASPAN) 500 MG CR tablet Take 2,000 mg by mouth every evening.     . pantoprazole (PROTONIX) 40 MG tablet TAKE 1 TABLET BY MOUTH DAILY 30 TO 60 MINUTES BEFORE FIRST MEAL OF THE DAY 90 tablet 0  . Pirfenidone (ESBRIET) 267 MG CAPS Take 3 capsules by mouth 3 (three) times daily.    . rivaroxaban (XARELTO) 20 MG TABS tablet Take 1 tablet (20 mg total) every morning by mouth. 21 tablet 0  . simvastatin (ZOCOR) 20 MG tablet TAKE 1 TABLET BY MOUTH EVERY DAY IN THE EVENING 30 tablet 6  . valsartan-hydrochlorothiazide (DIOVAN-HCT) 80-12.5 MG tablet TAKE 1 TABLET BY MOUTH EVERY DAY 30 tablet 6   No current facility-administered medications for this encounter.     REVIEW OF SYSTEMS:  REVIEW OF SYSTEMS: A 10+ POINT REVIEW OF SYSTEMS WAS OBTAINED including neurology, dermatology, psychiatry, cardiac, respiratory, lymph, extremities, GI, GU, musculoskeletal, constitutional, reproductive, HEENT. All pertinent positives are noted in the HPI. All others are negative.   PHYSICAL EXAM:  weight is 197 lb 9.6 oz (89.6 kg). His oral temperature is 97.6 F (36.4 C). His blood pressure is 128/70 and his pulse is 58 (abnormal). His respiration is 20 and oxygen saturation is 93%.   General: Alert and oriented, in no acute distress HEENT: Head is normocephalic. Extraocular movements are intact. Oropharynx is  clear. Neck: Neck is supple, no palpable cervical or supraclavicular lymphadenopathy. Heart: Regular in rate and rhythm with no murmurs, rubs, or gallops. Chest: Clear to auscultation bilaterally, with no rhonchi, wheezes, or rales. Long scar of the sternum from previous  CABG Abdomen: Soft, nontender, nondistended, with no rigidity or guarding. Extremities: No cyanosis or edema. Lymphatics: see Neck Exam Skin: No concerning lesions. Musculoskeletal: symmetric strength and muscle tone throughout. Neurologic: Cranial nerves II through XII are grossly intact. No obvious focalities. Speech is fluent. Coordination is intact. Psychiatric: Judgment and insight are intact. Affect is appropriate.   ECOG = 1  0 - Asymptomatic (Fully active, able to carry on all predisease activities without restriction)  1 - Symptomatic but completely ambulatory (Restricted in physically strenuous activity but ambulatory and able to carry out work of a light or sedentary nature. For example, light housework, office work)  2 - Symptomatic, <50% in bed during the day (Ambulatory and capable of all self care but unable to carry out any work activities. Up and  about more than 50% of waking hours)  3 - Symptomatic, >50% in bed, but not bedbound (Capable of only limited self-care, confined to bed or chair 50% or more of waking hours)  4 - Bedbound (Completely disabled. Cannot carry on any self-care. Totally confined to bed or chair)  5 - Death   Eustace Pen MM, Creech RH, Tormey DC, et al. [phone]). "Toxicity and response criteria of the [employer]". [place] Oncol. 5 (6): 649-55  LABORATORY DATA:  Lab Results  Component Value Date   WBC 9.9 03/25/2017   HGB 15.9 03/25/2017   HCT 47.9 03/25/2017   MCV 94.0 03/25/2017   PLT 157.0 03/25/2017   NEUTROABS 6.0 01/21/2017   Lab Results  Component Value Date   NA 135 03/25/2017   K 4.1 03/25/2017   CL 103 03/25/2017   CO2 23 03/25/2017    GLUCOSE 101 (H) 03/25/2017   CREATININE 1.20 03/25/2017   CALCIUM 10.1 03/25/2017      RADIOGRAPHY: No results found.    IMPRESSION: Presumptive Clinical Stage I Lung cancer (biopsy pending). I discussed options for management including potential surgery assuming the patient can obtain operative clearance. We also discussed consideration for stereotactic body radiation therapy as definitive treatment. 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 The patient wishes to be aggressive with his management and is leading towards definitive therapy that being surgery or SBRT. Patient will meet today with [name] were final determination concerning his operative status  PLAN: Pt will see Dr.Gerhardt today to determine if the pt is a good candidate for wedge resection, if not the pt will proceed with CT biopsy follow by SBRT.      ------------------------------------------------  Blair Promise, PhD, MD  This document serves as a record of services personally performed by Gery Pray, MD. It was created on his behalf by Valeta Harms, a trained medical scribe. The creation of this record is based on the scribe's personal observations and the provider's statements to them. This document has been checked and approved by the attending provider.

## 2017-08-14 NOTE — Progress Notes (Signed)
[address]       [place] [address]             [phone]                    Troy Miller [facility] Medical Record #[id number] Date of Birth: [birth year]  Referring: Brand Males, MD Primary Care: [name], PA-C Primary Cardiologist: No primary care provider on file.  Chief Complaint:   Lung mass History of Present Illness:    Troy Miller Below 77 y.o. male is seen in [place] pot on his lung.  The patient has a previous diagnosis both of coronary artery disease having had coronary artery bypass grafting in 1994 and redo coronary artery bypass grafting by me in 2005.  The patient has been followed in the pulmonary clinic for pulmonary fibrosis and increasing difficulty with breathing with activity.   Patient was noted in 2016 to have bilateral pulmonary nodules, follow-up CT scans were done in February 2018, May 2018 , November 2018.  And a PET scan was done June 18, 2017.  The patient was referred to the multidisciplinary thoracic oncology clinic by the pulmonary service, Dr. Chase Caller  With his progressive pulmonary fibrosis he has had a decrease in his pulmonary function studies he does have oxygen at home but does not use it all the time.  On testing in the office he did drop his sats with ambulation  11/16/2003  OPERATIVE REPORT  PREOPERATIVE DIAGNOSIS:  Coronary occlusive disease, recurrent.  POSTOPERATIVE DIAGNOSIS:  Coronary occlusive disease, recurrent.  PROCEDURE:  Re-do coronary artery bypass grafting off-pump x 3, with the  right internal mammary to the left anterior descending coronary artery,  reverse saphenous vein graft to the diagonal coronary artery and reverse  saphenous vein graft to the distal right coronary graft with endo-vein  harvesting, left thigh.  SURGEON:  Lilia Argue. Servando Snare, M.D.  Current Activity/ Functional Status:  Patient is independent with mobility/ambulation, transfers, ADL's, IADL's.   [place]  Score: At the time of surgery this patient's most appropriate activity status/level should be described as: _0     0    Normal activity, no symptoms _1     1    Restricted in physical strenuous activity but ambulatory, able to do out light work _2     2    Ambulatory and capable of self care, unable to do work activities, up and about               >50 % of waking hours                              _3     3    Only limited self care, in bed greater than 50% of waking hours _4     4    Completely disabled, no self care, confined to bed or chair _5     5    Moribund   Past Medical History:  Diagnosis Date  . AAA (abdominal aortic aneurysm) ([place])   . Aortic stenosis, mild    07/10/12 EF 50-55% on echo-mitral annular ca+ also  . Atrial fibrillation ([place])    03/19/10 ablation-[name]  . Atrial flutter ([place])   . CAD (coronary artery disease)   . DVT (deep venous thrombosis) ([place])   . History of stress test    Myoview 8/16:  EF 47%, inferior scar, no ischemia, Intermediate  risk (low EF)  . Hyperlipemia   . Hypertension   . Iliac artery aneurysm ([place])   . PVD (peripheral vascular disease) ([place])    remote right fem-tib bypass graft sugery  . 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 ABDOMINAL AORTOGRAM W/LOWER EXTREMITY N/A 02/18/2017   Procedure: ABDOMINAL AORTOGRAM W/LOWER EXTREMITY;  Surgeon: Serafina Mitchell, MD;  Location: [facility];  Service: Cardiovascular;  Laterality: N/A;  . BACK SURGERY  2003  . CARDIAC ELECTROPHYSIOLOGY STUDY AND ABLATION  03/19/10  . CARDIOVERSION  08/16/08  . CHOLECYSTECTOMY  08/13/02  . CORONARY ARTERY BYPASS GRAFT  1994 & 2005  . FEMORAL-TIBIAL BYPASS GRAFT     Remote  . HERNIA REPAIR  06/03/07  . PERIPHERAL VASCULAR INTERVENTION Left 02/18/2017   Procedure: PERIPHERAL VASCULAR INTERVENTION;  Surgeon: Serafina Mitchell, MD;  Location: [facility];  Service:  Cardiovascular;  Laterality: Left;  POPLITEAL  . VASECTOMY  01/28/08    Family History  Problem Relation Age of Onset  . Lung cancer Mother        smoked  . Heart failure Father   . Stroke Sister     Social History   Socioeconomic History  . Marital status: Single    Spouse name: Not on file  . Number of children: Not on file  . Years of education: Not on file  . Highest education level: Not on file  Social Needs  . Financial resource strain: Not on file  . Food insecurity - worry: Not on file  . Food insecurity - inability: Not on file  . Transportation needs - medical: Not on file  . Transportation needs - non-medical: Not on file  Occupational History  . Not on file  Tobacco Use  . Smoking status: Former Smoker    Packs/day: 2.00    Years: 25.00    Pack years: 50.00    Types: Cigarettes    Last attempt to quit: 04/26/1986    Years since quitting: 31.3  . Smokeless tobacco: Never Used  Substance and Sexual Activity  . Alcohol use: No    Alcohol/week: 0.0 oz  . Drug use: No  . Sexual activity: Not on file  Other Topics Concern  . Not on file  Social History Narrative  . Not on file    Social History   Tobacco Use  Smoking Status Former Smoker  . Packs/day: 2.00  . Years: 25.00  . Pack years: 50.00  . Types: Cigarettes  . Last attempt to quit: 04/26/1986  . Years since quitting: 31.3  Smokeless Tobacco Never Used    Social History   Substance and Sexual Activity  Alcohol Use No  . Alcohol/week: 0.0 oz     Allergies  Allergen Reactions  . Fentanyl And Related Other (See Comments)    Hallucinations    Current Outpatient Medications  Medication Sig Dispense Refill  . acetaminophen (TYLENOL) 500 MG tablet Take 1,000 mg by mouth every 6 (six) hours as needed for moderate pain or headache.     Marland Kitchen aspirin 81 MG tablet Take 81 mg by mouth daily.    . calcium carbonate (OS-CAL) 600 MG TABS Take 600 mg by mouth 2 (two) times daily with a meal.    .  Cholecalciferol (VITAMIN D) 2000 units CAPS Take 2,000 Units by mouth daily.    . famotidine (PEPCID) 20 MG tablet TAKE 1 TABLET  BY MOUTH EVERYDAY AT BEDTIME (Patient taking differently: TAKE 20 MG BY MOUTH EVERYDAY AT BEDTIME) 90 tablet 0  . folic acid (FOLVITE) 1 MG tablet Take 1 mg by mouth every evening.     . niacin (NIASPAN) 500 MG CR tablet Take 2,000 mg by mouth every evening.     Marland Kitchen oxyCODONE-acetaminophen (PERCOCET) 10-325 MG tablet Take 1 tablet by mouth 3 (three) times daily as needed for severe pain.    . pantoprazole (PROTONIX) 40 MG tablet TAKE 1 TABLET BY MOUTH DAILY 30 TO 60 MINUTES BEFORE FIRST MEAL OF THE DAY (Patient taking differently: TAKE 40 MG BY MOUTH DAILY 30 TO 60 MINUTES BEFORE FIRST MEAL OF THE DAY) 90 tablet 0  . Pirfenidone (ESBRIET) 267 MG CAPS Take 801 mg by mouth 3 (three) times daily.     . rivaroxaban (XARELTO) 20 MG TABS tablet Take 1 tablet (20 mg total) every morning by mouth. 21 tablet 0  . simvastatin (ZOCOR) 20 MG tablet TAKE 1 TABLET BY MOUTH EVERY DAY IN THE EVENING (Patient taking differently: TAKE 20 MG BY MOUTH EVERY DAY IN THE EVENING) 30 tablet 6  . valsartan-hydrochlorothiazide (DIOVAN-HCT) 80-12.5 MG tablet TAKE 1 TABLET BY MOUTH EVERY DAY 30 tablet 6   No current facility-administered medications for this visit.     Pertinent items are noted in HPI.   Review of Systems:  Review of Systems  Constitutional: Negative for chills, diaphoresis, fever, malaise/fatigue and weight loss.  HENT: Negative.   Eyes: Negative.   Respiratory: Positive for cough, shortness of breath and wheezing. Negative for hemoptysis and sputum production.   Cardiovascular: Negative for chest pain, palpitations, orthopnea, claudication and leg swelling.  Gastrointestinal: Negative.   Genitourinary: Negative.   Musculoskeletal: Negative.   Skin: Negative.   Neurological: Positive for weakness.  Endo/Heme/Allergies: Negative.   Psychiatric/Behavioral: Negative.       Physical Exam: There were no vitals taken for this visit.-See vital signs taken in the multidisciplinary thoracic oncology clinic on the day of visit  PHYSICAL EXAMINATION: General appearance: alert, cooperative, appears older than stated age and no distress Head: Normocephalic, without obvious abnormality, atraumatic Neck: no adenopathy, no carotid bruit, no JVD, supple, symmetrical, trachea midline and thyroid not enlarged, symmetric, no tenderness/mass/nodules Lymph nodes: Cervical, supraclavicular, and axillary nodes normal. Resp: clear to auscultation bilaterally Back: symmetric, no curvature. ROM normal. No CVA tenderness. Cardio: regular rate and rhythm, S1, S2 normal, no murmur, click, rub or gallop GI: soft, non-tender; bowel sounds normal; no masses,  no organomegaly Extremities: extremities normal, atraumatic, no cyanosis or edema and Homans sign is negative, no sign of DVT Neurologic: Grossly normal Previous sternotomy scar is well-healed  diagnostic Studies & Laboratory data:     Recent Radiology Findings:  CLINICAL DATA:  Initial treatment strategy for right lower lobe enlarging pulmonary nodule.  EXAM: NUCLEAR MEDICINE PET SKULL BASE TO THIGH  TECHNIQUE: 10.2 mCi F-18 FDG was injected intravenously. Full-ring PET imaging was performed from the skull base to thigh after the radiotracer. CT data was obtained and used for attenuation correction and anatomic localization.  FASTING BLOOD GLUCOSE:  Value: 102 mg/dl  COMPARISON:  Multiple exams, including chest CT dated 02/10/2017  FINDINGS: NECK  No hypermetabolic lymph nodes in the neck.  Chronic ethmoid and maxillary sinusitis. Common carotid atherosclerotic vascular calcification.  CHEST  The lesion of concern in the right lower lobe measures approximately 1.4 by 1.0 cm on image 55/8 and has a maximum SUV of 7.8, compatible with  malignancy.  Nodularity posteriorly in the left upper lobe is not  hypermetabolic.  Emphysema. 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 Small nodules posteriorly in the left lower lobe are not appreciably hypermetabolic but are below sensitive PET-CT size thresholds.  Cardiomegaly noted. Prior CABG. Coronary, aortic arch, and branch vessel atherosclerotic vascular disease.  ABDOMEN/PELVIS  No significant abnormal hypermetabolic activity in the abdomen/pelvis.  Cholecystectomy. Aortoiliac atherosclerotic vascular disease. 4.1 cm infrarenal abdominal aortic aneurysm. Vascular calcifications in both renal hila. Sigmoid diverticulosis. Physiologic activity in bowel.  SKELETON  No focal hypermetabolic activity to suggest skeletal metastasis.  Cervical, thoracic, and lumbar spondylosis with multilevel postoperative findings in the lumbar spine. Bridging spurring in the left sacroiliac joint.  Indirect right inguinal hernia contains adipose tissue and a loop of small bowel without findings of strangulation or obstruction.  IMPRESSION: 1. The right lower lobe pulmonary nodule of concern measuring 1.4 by 1.0 cm has a maximum SUV of 7.8, compatible with malignancy. No adenopathy or distant metastatic disease. 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 2. 4.1 cm in diameter infrarenal abdominal aortic aneurysm. Recommend followup by ultrasound in 1 year. This recommendation follows ACR consensus guidelines: White Paper of the ACR Incidental Findings Committee II on Vascular Findings. J Am Coll Radiol 2013; 10:789-794. 3. Other imaging findings of potential clinical significance: Chronic paranasal sinusitis. Aortic Atherosclerosis (ICD10-I70.0) and Emphysema (ICD10-J43.9). Chronic interstitial lung disease. Coronary atherosclerosis. Cardiomegaly. Pleural findings of remote asbestos exposure. Sigmoid diverticulosis.  Spondylosis. Indirect right inguinal hernia contains adipose tissue and a small bowel loop, without complicating feature.   Electronically Signed   By: Van Clines M.D.   On: 06/18/2017 15:12 I have independently reviewed the above radiology studies  and reviewed the findings with the patient.   Recent Lab Findings: Lab Results  Component Value Date   WBC 9.9 03/25/2017   HGB 15.9 03/25/2017   HCT 47.9 03/25/2017   PLT 157.0 03/25/2017   GLUCOSE 101 (H) 03/25/2017   CHOL 95 (L) 12/06/2016   TRIG 136 12/06/2016   HDL 34 (L) 12/06/2016   LDLCALC 34 12/06/2016   ALT 9 05/12/2017   AST 14 05/12/2017   NA 135 03/25/2017   K 4.1 03/25/2017   CL 103 03/25/2017   CREATININE 1.20 03/25/2017   BUN 18 03/25/2017   CO2 23 03/25/2017   TSH 3.440 12/06/2016   INR 1.77 (H) 03/19/2010   PFT's 08/01/2017 FEV1 2.83  dlco 11.59 32%  Assessment / Plan:   1/The right lower lobe pulmonary nodule of concern measuring 1.4 by 1.0 cm has a maximum SUV of 7.8, compatible with malignancy 2/progressive pulmonary fibrosis with significant diffusion capacity deficit, symptomatic with desaturations with minor activity around the office 3/long-standing coronary occlusive disease status post coronary artery bypass grafting in 1993 and 2005.  I discussed with the patient the probable diagnosis of stage I lung cancer that based radiographically.  But with his long history of pulmonary nodules pulmonary fibrosis asbestosis a definitive radiographic diagnosis is difficult to make.  With the patient's underlying medical condition and acute extremely limited pulmonary reserve as far as diffusion capacity surgical resection would carry increased risk.  I discussed this with the patient and we will proceed with needle biopsy of the lung lesion to make a definitive diagnosis of cancer before making final treatment decision.  He will have to hold his Xarelto prior to biopsy.  I will plan to  see him back after  we have the results of the biopsy and further discuss her treatment options.      Grace Isaac MD      [address] [place] [address] Office [phone]   Beeper [phone]  08/21/2017 2:06 PM

## 2017-08-14 NOTE — Telephone Encounter (Signed)
Okay to proceed and hold Xarelto for at least 48 hours prior to procedure

## 2017-08-14 NOTE — Therapy (Signed)
[place], [place], [address] Phone: [phone]   Fax:  [phone]  Physical Therapy Evaluation  Patient Details  Name: Troy Miller MRN: [id number] Date of Birth: 1940/07/30 Referring Provider: Dr. Gery Pray   Encounter Date: 08/14/2017  PT End of Session - 08/14/17 1501    Visit Number  1    Number of Visits  1    PT Start Time  8335    PT Stop Time  1445    PT Time Calculation (min)  25 min    Activity Tolerance  Patient tolerated treatment well    Behavior During Therapy  [facility] for tasks assessed/performed       Past Medical History:  Diagnosis Date  . AAA (abdominal aortic aneurysm) ([place])   . Aortic stenosis, mild    07/10/12 EF 50-55% on echo-mitral annular ca+ also  . Atrial fibrillation ([place])    03/19/10 ablation-[name]  . Atrial flutter ([place])   . CAD (coronary artery disease)   . DVT (deep venous thrombosis) ([place])   . History of stress test    Myoview 8/16:  EF 47%, inferior scar, no ischemia, Intermediate risk (low EF)  . Hyperlipemia   . Hypertension   . Iliac artery aneurysm ([place])   . PVD (peripheral vascular disease) ([place])    remote right fem-tib bypass graft sugery  . 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 ABDOMINAL AORTOGRAM W/LOWER EXTREMITY N/A 02/18/2017   Procedure: ABDOMINAL AORTOGRAM W/LOWER EXTREMITY;  Surgeon: Serafina Mitchell, MD;  Location: [facility];  Service: Cardiovascular;  Laterality: N/A;  . BACK SURGERY  2003  . CARDIAC ELECTROPHYSIOLOGY STUDY AND ABLATION  03/19/10  . CARDIOVERSION  08/16/08  . CHOLECYSTECTOMY  08/13/02  . CORONARY ARTERY BYPASS GRAFT  1994 & 2005  . FEMORAL-TIBIAL BYPASS GRAFT     Remote  . HERNIA REPAIR  06/03/07  . PERIPHERAL VASCULAR INTERVENTION Left 02/18/2017   Procedure: PERIPHERAL VASCULAR INTERVENTION;  Surgeon: Serafina Mitchell, MD;   Location: [facility];  Service: Cardiovascular;  Laterality: Left;  POPLITEAL  . VASECTOMY  01/28/08    There were no vitals filed for this visit.   Subjective Assessment - 08/14/17 1450    Subjective  "I would just have surgery and go."    Patient is accompained by:  Family member significant other    Pertinent History  Pt. had been followed to to h/o interstitial lung disease and pulmonary nodules; now with right lower lobe enlarging nodule, but no pathology yet.  Pt. 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  He MAY have wedge resection, needle biopsy and radiation or just radiation--still to be determined. h/o back surgery 2003, which pt. reports was possibly a laminectomy L2-S1 and was successful. Ex-smoker whoquit 1987.    Patient Stated Goals  get info from all lung clinic providers    Currently in Pain?  No/denies         [facility] PT Assessment - 08/14/17 0001      Assessment   Medical Diagnosis  right lower lobe enlarging nodule, no pathology to date    Referring Provider  Dr. Gery Pray    Onset Date/Surgical Date  05/20/17    Hand Dominance  Right    Prior Therapy  none      Precautions   Precautions  Other (comment)  Precaution Comments  cancer precautions; on home O2 prn activity      Restrictions   Weight Bearing Restrictions  No      Balance Screen   Has the patient fallen in the past 6 months  No    Has the patient had a decrease in activity level because of a fear of falling?   No    Is the patient reluctant to leave their home because of a fear of falling?   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    Leisure  no regular exercise, but reports being active on his job      Cognition   Overall Cognitive Status  Within Functional Limits for tasks assessed      Observation/Other Assessments   Observations  older gentleman whose face looks flushed, attentive and engaged; no shortness of breath on eval except with 30 second sit to stand      Coordination   Gross Motor Movements are Fluid and Coordinated  Yes      Functional Tests   Functional tests  Sit to Stand      Sit to Stand   Comments  10 times in 30 seconds, just below average for age mild shortness of breath with this      Posture/Postural Control   Posture/Postural Control  Postural limitations    Postural Limitations  Forward head      ROM / Strength   AROM / PROM / Strength  AROM      AROM   Overall AROM Comments  standing trunk AROM shows limitations in flexion (reaches fingertips 12 inches to floor) and extension (25% loss); others WFL denies pain with these motions      Ambulation/Gait   Ambulation/Gait  Yes    Ambulation/Gait Assistance  7: Independent    Gait Comments  says he is limited by SOB, but could walk from front lobby to exam room in [place] without SOB      Balance   Balance Assessed  Yes      Dynamic Standing Balance   Dynamic Standing - Comments  reaches forward 8 inches in standing, below average for age             Objective measurements completed on examination: See above findings.              PT Education - 08/14/17 1459    Education provided  Yes    Education Details  energy conservation, walking, CURE article on staying active, "Why exercise?" flyer, posture, breathing, PT info; also about pulmonary rehab and Prehab exercise class    Person(s) Educated  Patient;Other (comment) significant other    Methods  Explanation;Handout    Comprehension  Verbalized understanding            Lung Clinic Goals - 08/14/17 1506      Patient will be  able to verbalize understanding of the benefit of exercise to decrease fatigue.   Status  Achieved      Patient  will be able to verbalize the importance of posture.   Status  Achieved      Patient will be able to demonstrate diaphragmatic breathing for improved lung function.   Status  Achieved      Patient will be able to verbalize understanding of the role of physical therapy to prevent functional decline and who to contact if physical therapy is needed.   Status  Achieved           Plan - 08/14/17 1501    Clinical Impression Statement  Pt. with enlarging pulmonary nodule in right lower lobe, but no biopsy performed yet for diagnosis.  He may have resection, biopsy followed by radiation, or radiation without biopsy because of interstitial lung disease and asbestosis. He has a h/o low back surgery.  Trunk ROM is limited, forward reach in standing is limited, and he performed just below average on 30 second sit to stand with mild SOB. He has forward head posture.    History and Personal Factors relevant to plan of care:  h/o low back surgery 2003; on home O2 prn with activity    Clinical Presentation  Evolving    Clinical Presentation due to:  just being worked up for possible lung cancer    Clinical Decision Making  Moderate    Rehab Potential  Good    PT Frequency  One time visit    PT Treatment/Interventions  Patient/family education    PT Next Visit Plan  No follow-up planned, but patient was told about pulmonary rehab and about the Prehab exercise class, either/both of which he might benefit from.    PT Home Exercise Plan  progressive walking program to tolerance, breathing exercises    Recommended Other Services  possibly pulmonary rehab and/or Prehab exercise class    Consulted and Agree with Plan of Care  Patient       Patient will benefit from skilled therapeutic intervention in order to improve the following deficits and impairments:  Decreased range of motion, Postural  dysfunction, Cardiopulmonary status limiting activity  Visit Diagnosis: Lung neoplasm - Plan: PT plan of care cert/re-cert  Abnormal posture - Plan: PT plan of care cert/re-cert  Other symptoms and signs involving the musculoskeletal system - Plan: PT plan of care cert/re-cert  Difficulty in walking, not elsewhere classified - Plan: PT plan of care cert/re-cert     Problem List Patient Active Problem List   Diagnosis Date Noted  . IPF (idiopathic pulmonary fibrosis) ([place]) 11/08/2016  . Chronic respiratory failure with hypoxia ([place]) 09/06/2016  . Dyspnea, unspecified 05/17/2016  . Obstructive sleep apnea 05/17/2015  . 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 Nodule of left lung 03/10/2015  . Mixed hyperlipidemia 11/05/2013  . Essential hypertension 11/05/2013  . Chronic anticoagulation 11/05/2013  . Bradycardia 11/05/2013  . CAD (coronary artery disease) 04/26/2013  . Atrial flutter ([place]) 04/26/2013  . PVD (peripheral vascular disease) ([place]) 04/26/2013  . Mild aortic stenosis 04/26/2013    SALISBURY,DONNA 08/14/2017, 3:09 PM  [place], [place], [address] Phone: [phone]   Fax:  [phone]  Name: ALICK LECOMTE MRN: [id number] Date of Birth: 06/04/1941  Serafina Royals, PT 08/14/17 3:09 PM

## 2017-08-15 ENCOUNTER — Other Ambulatory Visit: Payer: Self-pay | Admitting: *Deleted

## 2017-08-15 DIAGNOSIS — R911 Solitary pulmonary nodule: Secondary | ICD-10-CM

## 2017-08-18 ENCOUNTER — Telehealth: Payer: Self-pay | Admitting: Internal Medicine

## 2017-08-18 DIAGNOSIS — J9611 Chronic respiratory failure with hypoxia: Secondary | ICD-10-CM

## 2017-08-18 NOTE — Telephone Encounter (Signed)
Per [facility] note pt uses [facility] in [place]- was told by rep that pt needs to be recertified for O2 by 6/0/73.  I advised that pt was seen on 1/25 and per chart had a qualifying walk showing he requires 2lpm with exertion.  Rep states that this needs to be faxed to [facility].  atc pt X2 to make aware, line rang to fast busy signal.    Order placed to continue O2.

## 2017-08-19 NOTE — Telephone Encounter (Signed)
Attempted to call patient x 2, cannot get number to go through. WCB

## 2017-08-20 NOTE — Telephone Encounter (Signed)
I attempted to call pt but unable to get through, the line rings busy.

## 2017-08-21 ENCOUNTER — Other Ambulatory Visit: Payer: Self-pay | Admitting: Radiology

## 2017-08-21 NOTE — Telephone Encounter (Signed)
ATC pt x3 and received busy dial.  Will call back.

## 2017-08-22 ENCOUNTER — Other Ambulatory Visit: Payer: Self-pay | Admitting: General Surgery

## 2017-08-22 ENCOUNTER — Other Ambulatory Visit: Payer: Self-pay | Admitting: Student

## 2017-08-22 NOTE — Telephone Encounter (Signed)
Unable to reach patient, will close encounter per protocol due to not being able to reach patient. Unable to do anything at this time.

## 2017-08-25 ENCOUNTER — Telehealth: Payer: Self-pay | Admitting: Cardiovascular Disease

## 2017-08-25 ENCOUNTER — Ambulatory Visit (HOSPITAL_COMMUNITY)
Admission: RE | Admit: 2017-08-25 | Discharge: 2017-08-25 | Disposition: A | Discharge status: Home / Self Care | Payer: Medicare Other | Source: Ambulatory Visit | Attending: Interventional Radiology | Admitting: Interventional Radiology

## 2017-08-25 ENCOUNTER — Encounter (HOSPITAL_COMMUNITY): Payer: Self-pay

## 2017-08-25 ENCOUNTER — Ambulatory Visit (HOSPITAL_COMMUNITY)
Admission: RE | Admit: 2017-08-25 | Discharge: 2017-08-25 | Disposition: A | Discharge status: Home / Self Care | Payer: Medicare Other | Source: Ambulatory Visit | Attending: Cardiothoracic Surgery | Admitting: Cardiothoracic Surgery

## 2017-08-25 DIAGNOSIS — J61 Pneumoconiosis due to asbestos and other mineral fibers: Secondary | ICD-10-CM | POA: Diagnosis not present

## 2017-08-25 DIAGNOSIS — Z885 Allergy status to narcotic agent status: Secondary | ICD-10-CM | POA: Diagnosis not present

## 2017-08-25 DIAGNOSIS — I2510 Atherosclerotic heart disease of native coronary artery without angina pectoris: Secondary | ICD-10-CM | POA: Insufficient documentation

## 2017-08-25 DIAGNOSIS — Z951 Presence of aortocoronary bypass graft: Secondary | ICD-10-CM | POA: Diagnosis not present

## 2017-08-25 DIAGNOSIS — R911 Solitary pulmonary nodule: Secondary | ICD-10-CM | POA: Diagnosis present

## 2017-08-25 DIAGNOSIS — Z801 Family history of malignant neoplasm of trachea, bronchus and lung: Secondary | ICD-10-CM | POA: Diagnosis not present

## 2017-08-25 DIAGNOSIS — J8410 Pulmonary fibrosis, unspecified: Secondary | ICD-10-CM | POA: Insufficient documentation

## 2017-08-25 DIAGNOSIS — Z8249 Family history of ischemic heart disease and other diseases of the circulatory system: Secondary | ICD-10-CM | POA: Insufficient documentation

## 2017-08-25 DIAGNOSIS — Z87891 Personal history of nicotine dependence: Secondary | ICD-10-CM | POA: Diagnosis not present

## 2017-08-25 DIAGNOSIS — Z823 Family history of stroke: Secondary | ICD-10-CM | POA: Insufficient documentation

## 2017-08-25 DIAGNOSIS — Z9049 Acquired absence of other specified parts of digestive tract: Secondary | ICD-10-CM | POA: Diagnosis not present

## 2017-08-25 DIAGNOSIS — Z95828 Presence of other vascular implants and grafts: Secondary | ICD-10-CM | POA: Diagnosis not present

## 2017-08-25 DIAGNOSIS — Z9889 Other specified postprocedural states: Secondary | ICD-10-CM

## 2017-08-25 DIAGNOSIS — Z7901 Long term (current) use of anticoagulants: Secondary | ICD-10-CM | POA: Diagnosis not present

## 2017-08-25 DIAGNOSIS — I10 Essential (primary) hypertension: Secondary | ICD-10-CM | POA: Diagnosis not present

## 2017-08-25 DIAGNOSIS — Z79899 Other long term (current) drug therapy: Secondary | ICD-10-CM | POA: Diagnosis not present

## 2017-08-25 DIAGNOSIS — C3431 Malignant neoplasm of lower lobe, right bronchus or lung: Secondary | ICD-10-CM | POA: Insufficient documentation

## 2017-08-25 DIAGNOSIS — Z86718 Personal history of other venous thrombosis and embolism: Secondary | ICD-10-CM | POA: Diagnosis not present

## 2017-08-25 DIAGNOSIS — E785 Hyperlipidemia, unspecified: Secondary | ICD-10-CM | POA: Diagnosis not present

## 2017-08-25 DIAGNOSIS — I714 Abdominal aortic aneurysm, without rupture: Secondary | ICD-10-CM | POA: Insufficient documentation

## 2017-08-25 LAB — CBC
HEMATOCRIT: 46.2 % (ref 39.0–52.0)
Hemoglobin: 15.3 g/dL (ref 13.0–17.0)
MCH: 31.7 pg (ref 26.0–34.0)
MCHC: 33.1 g/dL (ref 30.0–36.0)
MCV: 95.7 fL (ref 78.0–100.0)
PLATELETS: 144 10*3/uL — AB (ref 150–400)
RBC: 4.83 MIL/uL (ref 4.22–5.81)
RDW: 15 % (ref 11.5–15.5)
WBC: 8.1 10*3/uL (ref 4.0–10.5)

## 2017-08-25 LAB — APTT: aPTT: 35 seconds (ref 24–36)

## 2017-08-25 LAB — PROTIME-INR
INR: 1.06
Prothrombin Time: 13.7 seconds (ref 11.4–15.2)

## 2017-08-25 MED ORDER — MIDAZOLAM HCL 2 MG/2ML IJ SOLN
INTRAMUSCULAR | Status: AC
  Filled 2017-08-25: qty 4

## 2017-08-25 MED ORDER — SODIUM CHLORIDE 0.9 % IV SOLN: INTRAVENOUS | Status: DC

## 2017-08-25 MED ORDER — HYDROMORPHONE HCL 1 MG/ML IJ SOLN
INTRAMUSCULAR | Status: AC | PRN
  Administered 2017-08-25: 0.5 mg via INTRAVENOUS

## 2017-08-25 MED ORDER — FLUMAZENIL 0.5 MG/5ML IV SOLN
INTRAVENOUS | Status: AC
  Filled 2017-08-25: qty 5

## 2017-08-25 MED ORDER — MIDAZOLAM HCL 2 MG/2ML IJ SOLN
INTRAMUSCULAR | Status: AC | PRN
  Administered 2017-08-25: 0.5 mg via INTRAVENOUS

## 2017-08-25 MED ORDER — HYDROMORPHONE HCL 1 MG/ML IJ SOLN
INTRAMUSCULAR | Status: AC
  Filled 2017-08-25: qty 1

## 2017-08-25 MED ORDER — NALOXONE HCL 0.4 MG/ML IJ SOLN
INTRAMUSCULAR | Status: AC
  Filled 2017-08-25: qty 1

## 2017-08-25 MED ORDER — LIDOCAINE-EPINEPHRINE 1 %-1:100000 IJ SOLN
INTRAMUSCULAR | Status: AC
  Filled 2017-08-25: qty 1

## 2017-08-25 NOTE — Sedation Documentation (Signed)
Patient denies pain and is resting comfortably.  

## 2017-08-25 NOTE — Telephone Encounter (Signed)
Pt aware Xarelto samples left at front desk .Adonis Housekeeper

## 2017-08-25 NOTE — Discharge Instructions (Signed)
Needle Biopsy of the Lung, Care After This sheet gives you information about how to care for yourself after your procedure. Your health care provider may also give you more specific instructions. If you have problems or questions, contact your health care provider. What can I expect after the procedure? After the procedure, it is common to have:  Soreness, pain, and tenderness where a tissue sample was taken (biopsy site).  A cough.  A sore throat.  Follow these instructions at home: Biopsy site care  Follow instructions from your health care provider about when to remove the bandage that was placed on the biopsy site.  Keep the bandage dry until it has been removed.  Check your biopsy site every day for signs of infection. Check for: ? More redness, swelling, or pain. ? More fluid or blood. ? Warmth to the touch. ? Pus or a bad smell. General instructions  Rest as directed by your health care provider. Ask your health care provider what activities are safe for you.  Do not take baths, swim, or use a hot tub until your health care provider approves.  Take over-the-counter and prescription medicines only as told by your health care provider.  If you have airplane travel scheduled, talk with your health care provider about when it is safe for you to travel by airplane.  It is up to you to get the results of your procedure. Ask your health care provider, or the department that is doing the procedure, when your results will be ready.  Keep all follow-up visits as told by your health care provider. This is important. Contact a health care provider if:  You have more redness, swelling, or pain around your biopsy site.  You have more fluid or blood coming from your biopsy site.  Your biopsy site feels warm to the touch.  You have pus or a bad smell coming from your biopsy site.  You have a fever.  You have pain that does not get better with medicine. Get help right away  if:  You have problems breathing.  You have chest pain.  You cough up blood.  You faint.  You have a fast heart rate. Summary  After a needle biopsy of the lung, it is common to have a cough, a sore throat, or soreness, pain, and tenderness where a tissue sample was taken (biopsy site).  You should check your biopsy area every day for signs of infection, including pus or a bad smell, warmth, more fluid or blood, or more redness, swelling, or pain.  You should not take baths, swim, or use a hot tub until your health care provider approves.  It is up to you to get the results of your procedure. Ask your health care provider, or the department that is doing the procedure, when your results will be ready. This information is not intended to replace advice given to you by your health care provider. Make sure you discuss any questions you have with your health care provider. Document Released: 04/21/2007 Document Revised: 05/15/2016 Document Reviewed: 05/15/2016 Elsevier Interactive Patient Education  2017 [place].  Moderate Conscious Sedation, Adult, Care After These instructions provide you with information about caring for yourself after your procedure. Your health care provider may also give you more specific instructions. Your treatment has been planned according to current medical practices, but problems sometimes occur. Call your health care provider if you have any problems or questions after your procedure. What can I expect after the  procedure? After your procedure, it is common:  To feel sleepy for several hours.  To feel clumsy and have poor balance for several hours.  To have poor judgment for several hours.  To vomit if you eat too soon.  Follow these instructions at home: For at least 24 hours after the procedure:   Do not: ? Participate in activities where you could fall or become injured. ? Drive. ? Use heavy machinery. ? Drink alcohol. ? Take sleeping  pills or medicines that cause drowsiness. ? Make important decisions or sign legal documents. ? Take care of children on your own.  Rest. Eating and drinking  Follow the diet recommended by your health care provider.  If you vomit: ? Drink water, juice, or soup when you can drink without vomiting. ? Make sure you have little or no nausea before eating solid foods. General instructions  Have a responsible adult stay with you until you are awake and alert.  Take over-the-counter and prescription medicines only as told by your health care provider.  If you smoke, do not smoke without supervision.  Keep all follow-up visits as told by your health care provider. This is important. Contact a health care provider if:  You keep feeling nauseous or you keep vomiting.  You feel light-headed.  You develop a rash.  You have a fever. Get help right away if:  You have trouble breathing. This information is not intended to replace advice given to you by your health care provider. Make sure you discuss any questions you have with your health care provider. Document Released: 04/14/2013 Document Revised: 11/27/2015 Document Reviewed: 10/14/2015 Elsevier Interactive Patient Education  [employer].

## 2017-08-25 NOTE — Telephone Encounter (Signed)
NEW MESSAGE    Patient calling the office for samples of medication:   1.  What medication and dosage are you requesting samples for? rivaroxaban (XARELTO) 20 MG TABS tablet  2.  Are you currently out of this medication? YES

## 2017-08-25 NOTE — H&P (Signed)
Chief Complaint: Patient was seen in consultation today for right lung mass biopsy at the request of Gerhardt,Edward B  Referring Physician(s): Grace Isaac  Supervising Physician: Sandi Mariscal  Patient Status: [facility] - Out-pt  History of Present Illness: Troy Miller is a 77 y.o. male   CAD/CABG 1994 and 2005 On Xarelto LD Fri 2/15 Known Pulmonary fibrosis Lung nodules followed since 2016 CT 05/2017: IMPRESSION: 1. Interval growth of solid 1.4 cm right lower lobe pulmonary nodule. Primary bronchogenic malignancy is a consideration. Consider PET-CT, tissue sampling or short-term follow-up chest CT in 3 months, as clinically warranted. 2. 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 3. 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  PET 06/2017: IMPRESSION: 1. The right lower lobe pulmonary nodule of concern measuring 1.4 by 1.0 cm has a maximum SUV of 7.8, compatible with malignancy. No adenopathy or distant metastatic disease. 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 2. 4.1 cm in diameter infrarenal abdominal aortic aneurysm. Recommend followup by ultrasound in 1 year. This recommendation follows ACR consensus guidelines: White Paper of the ACR Incidental Findings Committee II on Vascular Findings. J Am Coll Radiol 2013; 10:789-794. 3. Other imaging findings of potential clinical significance: Chronic paranasal sinusitis. Aortic Atherosclerosis (ICD10-I70.0) and Emphysema (ICD10-J43.9). Chronic interstitial lung disease. Coronary atherosclerosis. Cardiomegaly. Pleural findings of remote asbestos exposure. Sigmoid diverticulosis. Spondylosis. Indirect right inguinal hernia contains adipose tissue and a small bowel loop, without complicating feature.  Dr Servando Snare note  08/14/17: I discussed with the patient the probable diagnosis of stage I lung cancer that based radiographically.  But with his long history of pulmonary nodules pulmonary fibrosis asbestosis a definitive radiographic diagnosis is difficult to make.  With the patient's underlying medical condition and acute extremely limited pulmonary reserve as far as diffusion capacity surgical resection would carry increased risk.  I discussed this with the patient and we will proceed with needle biopsy of the lung lesion to make a definitive diagnosis of cancer before making final treatment decision.  He will have to hold his Xarelto prior to biopsy.  I will plan to see him back after we have the results of the biopsy and further discuss her treatment options   Scheduled for biopsy of RLL mass   Past Medical History:  Diagnosis Date  . AAA (abdominal aortic aneurysm) ([place])   . Aortic stenosis, mild    07/10/12 EF 50-55% on echo-mitral annular ca+ also  . Atrial fibrillation ([place])    03/19/10 ablation-[name]  . Atrial flutter ([place])   . CAD (coronary artery disease)   . DVT (deep venous thrombosis) ([place])   . History of stress test    Myoview 8/16:  EF 47%, inferior scar, no ischemia, Intermediate risk (low EF)  . Hyperlipemia   . Hypertension   . Iliac artery aneurysm ([place])   . PVD (peripheral vascular disease) ([place])    remote right fem-tib bypass graft sugery  . 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 ABDOMINAL AORTOGRAM W/LOWER EXTREMITY N/A 02/18/2017   Procedure: ABDOMINAL AORTOGRAM W/LOWER EXTREMITY;  Surgeon: Serafina Mitchell, MD;  Location: [facility];  Service: Cardiovascular;  Laterality: N/A;  . BACK SURGERY  2003  . CARDIAC ELECTROPHYSIOLOGY STUDY AND ABLATION  03/19/10  . CARDIOVERSION  08/16/08  .  CHOLECYSTECTOMY  08/13/02  . CORONARY ARTERY BYPASS GRAFT  1994 & 2005  . FEMORAL-TIBIAL BYPASS GRAFT      Remote  . HERNIA REPAIR  06/03/07  . PERIPHERAL VASCULAR INTERVENTION Left 02/18/2017   Procedure: PERIPHERAL VASCULAR INTERVENTION;  Surgeon: Serafina Mitchell, MD;  Location: [facility];  Service: Cardiovascular;  Laterality: Left;  POPLITEAL  . VASECTOMY  01/28/08    Allergies: Fentanyl and related  Medications: Prior to Admission medications   Medication Sig Start Date End Date Taking? Authorizing Provider  acetaminophen (TYLENOL) 500 MG tablet Take 1,000 mg by mouth every 6 (six) hours as needed for moderate pain or headache.    Yes [provider]  aspirin 81 MG tablet Take 81 mg by mouth daily.   Yes [provider]  calcium carbonate (OS-CAL) 600 MG TABS Take 600 mg by mouth 2 (two) times daily with a meal.   Yes [provider]  Cholecalciferol (VITAMIN D) 2000 units CAPS Take 2,000 Units by mouth daily.   Yes [provider]  famotidine (PEPCID) 20 MG tablet TAKE 1 TABLET BY MOUTH EVERYDAY AT BEDTIME Patient taking differently: TAKE 20 MG BY MOUTH EVERYDAY AT BEDTIME 08/14/17  Yes Mannam, Praveen, MD  folic acid (FOLVITE) 1 MG tablet Take 1 mg by mouth every evening.    Yes [provider]  niacin (NIASPAN) 500 MG CR tablet Take 2,000 mg by mouth every evening.    Yes [provider]  oxyCODONE-acetaminophen (PERCOCET) 10-325 MG tablet Take 1 tablet by mouth 3 (three) times daily as needed for severe pain. 08/08/17  Yes [provider]  pantoprazole (PROTONIX) 40 MG tablet TAKE 1 TABLET BY MOUTH DAILY 30 TO 60 MINUTES BEFORE FIRST MEAL OF THE DAY Patient taking differently: TAKE 40 MG BY MOUTH DAILY 30 TO 60 MINUTES BEFORE FIRST MEAL OF THE DAY 08/14/17  Yes Mannam, Praveen, MD  Pirfenidone (ESBRIET) 267 MG CAPS Take 801 mg by mouth 3 (three) times daily.    Yes [provider]  rivaroxaban (XARELTO) 20 MG TABS tablet Take 1 tablet (20 mg total) every morning by mouth. 05/26/17  Yes Troy Sine, MD    simvastatin (ZOCOR) 20 MG tablet TAKE 1 TABLET BY MOUTH EVERY DAY IN THE EVENING Patient taking differently: TAKE 20 MG BY MOUTH EVERY DAY IN THE EVENING 07/22/17  Yes Troy Sine, MD  valsartan-hydrochlorothiazide (DIOVAN-HCT) 80-12.5 MG tablet TAKE 1 TABLET BY MOUTH EVERY DAY 07/22/17  Yes Troy Sine, MD     Family History  Problem Relation Age of Onset  . Lung cancer Mother        smoked  . Heart failure Father   . Stroke Sister     Social History   Socioeconomic History  . Marital status: Single    Spouse name: None  . Number of children: None  . Years of education: None  . Highest education level: None  Social Needs  . Financial resource strain: None  . Food insecurity - worry: None  . Food insecurity - inability: None  . Transportation needs - medical: None  . Transportation needs - non-medical: None  Occupational History  . None  Tobacco Use  . Smoking status: Former Smoker    Packs/day: 2.00    Years: 25.00    Pack years: 50.00    Types: Cigarettes    Last attempt to quit: 04/26/1986    Years since quitting: 31.3  . Smokeless tobacco: Never Used  Substance and Sexual Activity  . Alcohol use: No    Alcohol/week: 0.0 oz  . Drug use: No  . Sexual activity: None  Other Topics Concern  . None  Social History Narrative  . None    Review of Systems: A 12 point ROS discussed and pertinent positives are indicated in the HPI above.  All other systems are negative.  Review of Systems  Constitutional: Positive for fatigue. Negative for activity change and fever.  Respiratory: Positive for shortness of breath. Negative for cough.   Cardiovascular: Negative for chest pain.  Neurological: Negative for weakness.  Psychiatric/Behavioral: Negative for behavioral problems and confusion.    Vital Signs: BP (!) 157/80   Pulse (!) 50   Temp 97.9 F (36.6 C)   Resp 18   Ht 6' (1.829 m)   Wt 198 lb (89.8 kg)   SpO2 92%   BMI 26.85 kg/m   Physical Exam   Constitutional: He is oriented to person, place, and time.  Cardiovascular:  Irregular  Pulmonary/Chest: Effort normal and breath sounds normal.  Abdominal: Soft. Bowel sounds are normal.  Musculoskeletal: Normal range of motion.  Neurological: He is alert and oriented to person, place, and time.  Skin: Skin is dry.  Psychiatric: He has a normal mood and affect. His behavior is normal. Judgment and thought content normal.  Nursing note and vitals reviewed.   Imaging: No results found.  Labs:  CBC: Recent Labs    12/06/16 0839 01/21/17 1408 02/18/17 0915 03/25/17 1215  WBC 10.7 9.8  --  9.9  HGB 15.5 15.5 16.7 15.9  HCT 45.9 45.7 49.0 47.9  PLT 167 194.0  --  157.0    COAGS: No results for input(s): INR, APTT in the last 8760 hours.  BMP: Recent Labs    12/06/16 0839 01/21/17 1408 02/18/17 0915 03/25/17 1215  NA 138 136 140 135  K 5.1 4.0 4.5 4.1  CL 103 103 105 103  CO2 20 23  --  23  GLUCOSE 104* 118* 112* 101*  BUN _0 CALCIUM 9.7 9.9  --  10.1  CREATININE 1.27 1.23 1.20 1.20  GFRNONAA 54*  --   --   --   GFRAA 63  --   --   --     LIVER FUNCTION TESTS: Recent Labs    11/08/16 1332 01/21/17 1408 03/25/17 1215 05/12/17 1729  BILITOT 0.8 0.6 0.6 0.5  AST _1 ALT _2 ALKPHOS 60 57 63 63  PROT 8.0 7.6 8.0 7.8  ALBUMIN 4.8 4.6 4.5 4.3    TUMOR MARKERS: No results for input(s): AFPTM, CEA, CA199, CHROMGRNA in the last 8760 hours.  Assessment and Plan:  Pulm Fibrosis Right lung mass; enlarging +PET Scheduled for biopsy Risks and benefits discussed with the patient including, but not limited to bleeding, hemoptysis, respiratory failure requiring intubation, infection, pneumothorax requiring chest tube placement, stroke from air embolism or even death.  All of the patient's questions were answered, patient is agreeable to proceed. Consent signed and in chart.   Thank you for this interesting consult.  I greatly  enjoyed meeting Troy Miller and look forward to participating in their care.  A copy of this report was sent to the requesting provider on this date.  Electronically Signed: Lavonia Drafts, PA-C 08/25/2017, 10:38 AM   I spent a total of  30 Minutes   in face to face in clinical consultation, greater  than 50% of which was counseling/coordinating care for right lung mass biopsy

## 2017-08-25 NOTE — Procedures (Signed)
Pre procedural Dx: Right lower lobe pulmonary nodule  Post procedural Dx: Same  Technically successful CT guided biopsy of indeterminate hypermetabolic right lower lobe pulmonary nodule.   EBL: None.   Complications: None immediate.   Ronny Bacon, MD Pager #: [phone]

## 2017-08-28 ENCOUNTER — Telehealth: Payer: Self-pay | Admitting: Internal Medicine

## 2017-08-28 NOTE — Telephone Encounter (Signed)
I have checked with Rodena Piety, who has a copy of CMN. Signature on CMN is MR's. Kennyth Lose with [facility] is aware and voiced her understanding.  Nothing further is needed.

## 2017-08-28 NOTE — Telephone Encounter (Signed)
Called and spoke to Conley with [facility]. Kennyth Lose is wanting to verify MR's signature on CMN for O2.  Kennyth Lose state she will fax back CMN for confirmation.   Will route to [place] to f/u on. Thanks

## 2017-09-01 ENCOUNTER — Other Ambulatory Visit: Payer: Self-pay

## 2017-09-01 ENCOUNTER — Ambulatory Visit (INDEPENDENT_AMBULATORY_CARE_PROVIDER_SITE_OTHER): Payer: Medicare Other | Admitting: Cardiothoracic Surgery

## 2017-09-01 ENCOUNTER — Encounter: Payer: Self-pay | Admitting: Cardiothoracic Surgery

## 2017-09-01 VITALS — BP 121/62 | HR 67 | Resp 18 | Ht 71.0 in | Wt 194.8 lb

## 2017-09-01 DIAGNOSIS — C3431 Malignant neoplasm of lower lobe, right bronchus or lung: Secondary | ICD-10-CM | POA: Diagnosis not present

## 2017-09-01 NOTE — Progress Notes (Signed)
[address]       [place] [address]             [phone]                    Troy Miller [name] #[id number] Date of Birth: 09/01/1940  Referring: Brand Males, MD Primary Care: [name], PA-C Primary Cardiologist: No primary care provider on file.  Chief Complaint:   Lung mass History of Present Illness:    Troy Miller 77 y.o. male  Was seen in [place] [facility] on his lung.  The patient has a previous diagnosis both of coronary artery disease having had coronary artery bypass grafting in 1994 and redo coronary artery bypass grafting by me in 2005.  The patient has been followed in the pulmonary clinic for pulmonary fibrosis and increasing difficulty with breathing with activity.   Patient was noted in 2016 to have bilateral pulmonary nodules, follow-up CT scans were done in February 2018, May 2018 , November 2018.  And a PET scan was done June 18, 2017.  The patient was referred to the multidisciplinary thoracic oncology clinic by the pulmonary service, Dr. Chase Caller  With his progressive pulmonary fibrosis he has had a decrease in his pulmonary function studies he does have oxygen at home but does not use it all the time.  On testing in the office he did drop his sats with ambulation  11/16/2003  OPERATIVE REPORT  PREOPERATIVE DIAGNOSIS:  Coronary occlusive disease, recurrent.  POSTOPERATIVE DIAGNOSIS:  Coronary occlusive disease, recurrent.  PROCEDURE:  Re-do coronary artery bypass grafting off-pump x 3, with the  right internal mammary to the left anterior descending coronary artery,  reverse saphenous vein graft to the diagonal coronary artery and reverse  saphenous vein graft to the distal right coronary graft with endo-vein  harvesting, left thigh.  SURGEON:  Lilia Argue. Servando Snare, M.D.  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 Microscopic Comment Dr. Lyndon Code has reviewed the case. There may be sufficient tissue for additional studies if requested (block 1B). Troy Males MD Pathologist, Electronic Signature (Case signed 08/26/2017)   Current Activity/ Functional Status:  Patient is independent with mobility/ambulation, transfers, ADL's, IADL's.   Zubrod Score: At the time of surgery this patient's most appropriate activity status/level should be described as: _0     0    Normal activity, no symptoms _1     1    Restricted in physical strenuous activity but ambulatory, able to do out light work _2     2    Ambulatory and capable of self care, unable to do work activities, up and about               >50 % of waking hours                              _3     3    Only limited self care, in bed greater than 50% of waking hours _4     4    Completely disabled, no self care, confined to bed or chair _5     5    Moribund   Past Medical History:  Diagnosis Date  . AAA (abdominal aortic aneurysm) ([facility])   . Aortic stenosis, mild    07/10/12 EF 50-55% on  echo-mitral annular ca+ also  . Atrial fibrillation ([facility])    03/19/10 ablation-[name]  . Atrial flutter ([facility])   . CAD (coronary artery disease)   . DVT (deep venous thrombosis) ([facility])   . History of stress test    Myoview 8/16:  EF 47%, inferior scar, no ischemia, Intermediate risk (low EF)  . Hyperlipemia   . Hypertension   . Iliac artery aneurysm ([facility])   . PVD (peripheral vascular disease) ([facility])    remote right fem-tib bypass graft sugery  . 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 ABDOMINAL AORTOGRAM W/LOWER EXTREMITY N/A 02/18/2017   Procedure: ABDOMINAL AORTOGRAM W/LOWER EXTREMITY;  Surgeon: Serafina Mitchell, MD;  Location: [facility];  Service: Cardiovascular;  Laterality: N/A;  . BACK SURGERY  2003  . CARDIAC ELECTROPHYSIOLOGY STUDY AND ABLATION   03/19/10  . CARDIOVERSION  08/16/08  . CHOLECYSTECTOMY  08/13/02  . CORONARY ARTERY BYPASS GRAFT  1994 & 2005  . FEMORAL-TIBIAL BYPASS GRAFT     Remote  . HERNIA REPAIR  06/03/07  . PERIPHERAL VASCULAR INTERVENTION Left 02/18/2017   Procedure: PERIPHERAL VASCULAR INTERVENTION;  Surgeon: Serafina Mitchell, MD;  Location: [facility];  Service: Cardiovascular;  Laterality: Left;  POPLITEAL  . VASECTOMY  01/28/08    Family History  Problem Relation Age of Onset  . Lung cancer Mother        smoked  . Heart failure Father   . Stroke Sister     Social History   Socioeconomic History  . Marital status: Single    Spouse name: Not on file  . Number of children: Not on file  . Years of education: Not on file  . Highest education level: Not on file  Social Needs  . Financial resource strain: Not on file  . Food insecurity - worry: Not on file  . Food insecurity - inability: Not on file  . Transportation needs - medical: Not on file  . Transportation needs - non-medical: Not on file  Occupational History  . Not on file  Tobacco Use  . Smoking status: Former Smoker    Packs/day: 2.00    Years: 25.00    Pack years: 50.00    Types: Cigarettes    Last attempt to quit: 04/26/1986    Years since quitting: 31.3  . Smokeless tobacco: Never Used  Substance and Sexual Activity  . Alcohol use: No    Alcohol/week: 0.0 oz  . Drug use: No  . Sexual activity: Not on file  Other Topics Concern  . Not on file  Social History Narrative  . Not on file    Social History   Tobacco Use  Smoking Status Former Smoker  . Packs/day: 2.00  . Years: 25.00  . Pack years: 50.00  . Types: Cigarettes  . Last attempt to quit: 04/26/1986  . Years since quitting: 31.3  Smokeless Tobacco Never Used    Social History   Substance and Sexual Activity  Alcohol Use No  . Alcohol/week: 0.0 oz     Allergies  Allergen Reactions  . Fentanyl And Related Other (See Comments)    Hallucinations     Current Outpatient Medications  Medication Sig Dispense Refill  . acetaminophen (TYLENOL) 500 MG tablet Take 1,000 mg by mouth every 6 (six) hours as needed for moderate pain or headache.     Marland Kitchen aspirin 81 MG tablet Take  81 mg by mouth daily.    . calcium carbonate (OS-CAL) 600 MG TABS Take 600 mg by mouth 2 (two) times daily with a meal.    . Cholecalciferol (VITAMIN D) 2000 units CAPS Take 2,000 Units by mouth daily.    . famotidine (PEPCID) 20 MG tablet TAKE 1 TABLET BY MOUTH EVERYDAY AT BEDTIME (Patient taking differently: TAKE 20 MG BY MOUTH EVERYDAY AT BEDTIME) 90 tablet 0  . folic acid (FOLVITE) 1 MG tablet Take 1 mg by mouth every evening.     . niacin (NIASPAN) 500 MG CR tablet Take 2,000 mg by mouth every evening.     Marland Kitchen oxyCODONE-acetaminophen (PERCOCET) 10-325 MG tablet Take 1 tablet by mouth 3 (three) times daily as needed for severe pain.    . pantoprazole (PROTONIX) 40 MG tablet TAKE 1 TABLET BY MOUTH DAILY 30 TO 60 MINUTES BEFORE FIRST MEAL OF THE DAY (Patient taking differently: TAKE 40 MG BY MOUTH DAILY 30 TO 60 MINUTES BEFORE FIRST MEAL OF THE DAY) 90 tablet 0  . Pirfenidone (ESBRIET) 267 MG CAPS Take 801 mg by mouth 3 (three) times daily.     . rivaroxaban (XARELTO) 20 MG TABS tablet Take 1 tablet (20 mg total) every morning by mouth. 21 tablet 0  . simvastatin (ZOCOR) 20 MG tablet TAKE 1 TABLET BY MOUTH EVERY DAY IN THE EVENING (Patient taking differently: TAKE 20 MG BY MOUTH EVERY DAY IN THE EVENING) 30 tablet 6  . valsartan-hydrochlorothiazide (DIOVAN-HCT) 80-12.5 MG tablet TAKE 1 TABLET BY MOUTH EVERY DAY 30 tablet 6   No current facility-administered medications for this visit.     Pertinent items are noted in HPI.   Review of Systems:  Review of Systems  Constitutional: Positive for malaise/fatigue. Negative for chills, diaphoresis, fever and weight loss.  HENT: Negative.   Eyes: Negative.   Respiratory: Positive for cough, shortness of breath and wheezing.    Cardiovascular: Positive for palpitations. Negative for chest pain, orthopnea, claudication, leg swelling and PND.  Gastrointestinal: Negative for abdominal pain, blood in stool, constipation, diarrhea, heartburn, melena, nausea and vomiting.  Genitourinary: Negative.   Musculoskeletal: Negative.   Skin: Negative.   Neurological: Negative.  Negative for weakness.  Endo/Heme/Allergies: Negative for environmental allergies and polydipsia. Bruises/bleeds easily.  Psychiatric/Behavioral: Negative.     Physical Exam: There were no vitals taken for this visit.-See vital signs taken in the multidisciplinary thoracic oncology clinic on the day of visit  PHYSICAL EXAMINATION: General appearance: alert, cooperative, appears stated age and no distress Head: Normocephalic, without obvious abnormality, atraumatic Neck: no adenopathy, no carotid bruit, no JVD, supple, symmetrical, trachea midline and thyroid not enlarged, symmetric, no tenderness/mass/nodules Lymph nodes: Cervical, supraclavicular, and axillary nodes normal. Resp: diminished breath sounds bibasilar Back: symmetric, no curvature. ROM normal. No CVA tenderness. Cardio: systolic murmur: early systolic 2/6, crescendo at 2nd right intercostal space GI: soft, non-tender; bowel sounds normal; no masses,  no organomegaly Extremities: extremities normal, atraumatic, no cyanosis or edema and Homans sign is negative, no sign of DVT Neurologic: Grossly normal   Previous sternotomy scar is well-healed  diagnostic Studies & Laboratory data:     Recent Radiology Findings:  CLINICAL DATA:  Initial treatment strategy for right lower lobe enlarging pulmonary nodule.  EXAM: NUCLEAR MEDICINE PET SKULL BASE TO THIGH  TECHNIQUE: 10.2 mCi F-18 FDG was injected intravenously. Full-ring PET imaging was performed from the skull base to thigh after the radiotracer. CT data was obtained and used for attenuation correction and  anatomic localization.  FASTING BLOOD GLUCOSE:  Value: 102 mg/dl  COMPARISON:  Multiple exams, including chest CT dated 02/10/2017  FINDINGS: NECK  No hypermetabolic lymph nodes in the neck.  Chronic ethmoid and maxillary sinusitis. Common carotid atherosclerotic vascular calcification.  CHEST  The lesion of concern in the right lower lobe measures approximately 1.4 by 1.0 cm on image 55/8 and has a maximum SUV of 7.8, compatible with malignancy.  Nodularity posteriorly in the left upper lobe is not hypermetabolic.  Emphysema. 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 Small nodules posteriorly in the left lower lobe are not appreciably hypermetabolic but are below sensitive PET-CT size thresholds.  Cardiomegaly noted. Prior CABG. Coronary, aortic arch, and branch vessel atherosclerotic vascular disease.  ABDOMEN/PELVIS  No significant abnormal hypermetabolic activity in the abdomen/pelvis.  Cholecystectomy. Aortoiliac atherosclerotic vascular disease. 4.1 cm infrarenal abdominal aortic aneurysm. Vascular calcifications in both renal hila. Sigmoid diverticulosis. Physiologic activity in bowel.  SKELETON  No focal hypermetabolic activity to suggest skeletal metastasis.  Cervical, thoracic, and lumbar spondylosis with multilevel postoperative findings in the lumbar spine. Bridging spurring in the left sacroiliac joint.  Indirect right inguinal hernia contains adipose tissue and a loop of small bowel without findings of strangulation or obstruction.  IMPRESSION: 1. The right lower lobe pulmonary nodule of concern measuring 1.4 by 1.0 cm has a maximum SUV of 7.8, compatible with malignancy. No adenopathy or distant metastatic disease. 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 2. 4.1 cm in diameter infrarenal abdominal aortic  aneurysm. Recommend followup by ultrasound in 1 year. This recommendation follows ACR consensus guidelines: White Paper of the ACR Incidental Findings Committee II on Vascular Findings. J Am Coll Radiol 2013; 10:789-794. 3. Other imaging findings of potential clinical significance: Chronic paranasal sinusitis. Aortic Atherosclerosis (ICD10-I70.0) and Emphysema (ICD10-J43.9). Chronic interstitial lung disease. Coronary atherosclerosis. Cardiomegaly. Pleural findings of remote asbestos exposure. Sigmoid diverticulosis. Spondylosis. Indirect right inguinal hernia contains adipose tissue and a small bowel loop, without complicating feature.   Electronically Signed   By: Van Clines M.D.   On: 06/18/2017 15:12  I have independently reviewed the above radiology studies  and reviewed the findings with the patient. Again today.    Recent Lab Findings: Lab Results  Component Value Date   WBC 8.1 08/25/2017   HGB 15.3 08/25/2017   HCT 46.2 08/25/2017   PLT 144 (L) 08/25/2017   GLUCOSE 101 (H) 03/25/2017   CHOL 95 (L) 12/06/2016   TRIG 136 12/06/2016   HDL 34 (L) 12/06/2016   LDLCALC 34 12/06/2016   ALT 9 05/12/2017   AST 14 05/12/2017   NA 135 03/25/2017   K 4.1 03/25/2017   CL 103 03/25/2017   CREATININE 1.20 03/25/2017   BUN 18 03/25/2017   CO2 23 03/25/2017   TSH 3.440 12/06/2016   INR 1.06 08/25/2017   PFT's 08/01/2017 FEV1 2.83  dlco 11.59 32%  Assessment / Plan:   1/The right lower lobe pulmonary nodule of concern measuring 1.4 by 1.0 cm has a maximum SUV of 7.8, /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  I discussed with the patient  the  diagnosis of stage I lung cancer that  based radiographically. 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  With the patient's underlying medical condition and acute extremely limited pulmonary reserve as far as diffusion capacity surgical resection would carry increased risk.  I discussed this with the patient and his family in detail and he prefers to proceed with stereotactic radiotherapy to the hypermetabolic lesion that was biopsied in the right lower lobe.  He understands it he will need continued follow-up with serial CT scans following this      Grace Isaac MD      [address] [place] [address] Office [phone]   Beeper [phone]  09/01/2017 7:35 AM

## 2017-09-08 ENCOUNTER — Ambulatory Visit
Admission: RE | Admit: 2017-09-08 | Discharge: 2017-09-08 | Disposition: A | Discharge status: Home / Self Care | Payer: Medicare Other | Source: Ambulatory Visit | Attending: Radiation Oncology | Admitting: Radiation Oncology

## 2017-09-08 DIAGNOSIS — C3431 Malignant neoplasm of lower lobe, right bronchus or lung: Secondary | ICD-10-CM | POA: Insufficient documentation

## 2017-09-08 DIAGNOSIS — Z510 Encounter for antineoplastic radiation therapy: Secondary | ICD-10-CM | POA: Diagnosis present

## 2017-09-08 NOTE — Progress Notes (Addendum)
  Radiation Oncology         [phone]) [phone] ________________________________  Name: Troy Miller MRN: [id number]  Date: 09/08/2017  DOB: 1940/07/10   STEREOTACTIC BODY RADIOTHERAPY SIMULATION AND TREATMENT PLANNING NOTE    DIAGNOSIS:    clinical stage I non-small cell lung cancer presenting in the right lower lobe [squamous cell]   NARRATIVE:  The patient was brought to the [facility].  Identity was confirmed.  All relevant records and images related to the planned course of therapy were reviewed.  The patient freely provided informed written consent to proceed with treatment after reviewing the details related to the planned course of therapy. The consent form was witnessed and verified by the simulation staff.  Then, the patient was set-up in a stable reproducible  supine position for radiation therapy.  A BodyFix immobilization pillow was fabricated for reproducible positioning.  Then I personally applied the abdominal compression paddle to limit respiratory excursion.  4D respiratoy motion management CT images were obtained.  Surface markings were placed.  The CT images were loaded into the planning software.  Then, using Cine, MIP, and standard views, the internal target volume (ITV) and planning target volumes (PTV) were delinieated, and avoidance structures were contoured.  Treatment planning then occurred.  The radiation prescription was entered and confirmed.  A total of two complex treatment devices were fabricated in the form of the BodyFix immobilization pillow and a neck accuform cushion.  I have requested : 3D Simulation  I have requested a DVH of the following structures: Heart, Lungs, Esophagus, Chest Wall, Brachial Plexus, Major Blood Vessels, and targets.  PLAN:  The patient will receive 54 Gy in 3 fractions.  -----------------------------------  Blair Promise, PhD, MD  This document serves as a record of services personally performed by Gery Pray, MD.  It was created on his behalf by [facility], a trained medical scribe. The creation of this record is based on the scribe's personal observations and the provider's statements to them. This document has been checked and approved by the attending provider.

## 2017-09-10 DIAGNOSIS — Z510 Encounter for antineoplastic radiation therapy: Secondary | ICD-10-CM | POA: Diagnosis not present

## 2017-09-16 ENCOUNTER — Other Ambulatory Visit: Payer: Self-pay | Admitting: Cardiovascular Disease

## 2017-09-16 ENCOUNTER — Ambulatory Visit
Admission: RE | Admit: 2017-09-16 | Discharge: 2017-09-16 | Disposition: A | Discharge status: Home / Self Care | Payer: Medicare Other | Source: Ambulatory Visit | Attending: Radiation Oncology | Admitting: Radiation Oncology

## 2017-09-16 DIAGNOSIS — Z510 Encounter for antineoplastic radiation therapy: Secondary | ICD-10-CM | POA: Diagnosis not present

## 2017-09-16 DIAGNOSIS — C3431 Malignant neoplasm of lower lobe, right bronchus or lung: Secondary | ICD-10-CM

## 2017-09-16 MED ORDER — RIVAROXABAN 20 MG PO TABS: 20.0000 mg | ORAL_TABLET | Freq: Every morning | ORAL | 0 refills | Status: DC

## 2017-09-16 NOTE — Telephone Encounter (Signed)
Patient advised that samples are available for pick up.

## 2017-09-16 NOTE — Progress Notes (Signed)
  Radiation Oncology         [phone] ________________________________  Name: HENRIK ORIHUELA MRN: [id number]  Date: 09/16/2017  DOB: 02/15/1941  Stereotactic Body Radiotherapy Treatment Procedure Note  NARRATIVE:  Leelynd A Stgermaine was brought to the stereotactic radiation treatment machine and placed supine on the CT couch. The patient was set up for stereotactic body radiotherapy on the body fix pillow.  3D TREATMENT PLANNING AND DOSIMETRY:  The patient's radiation plan was reviewed and approved prior to starting treatment.  It showed 3-dimensional radiation distributions overlaid onto the planning CT.  [facility] for the target structures as well as the organs at risk were reviewed. The documentation of this is filed in the radiation oncology EMR.  SIMULATION VERIFICATION:  The patient underwent CT imaging on the treatment unit.  These were carefully aligned to document that the ablative radiation dose would cover the target volume and maximally spare the nearby organs at risk according to the planned distribution.  SPECIAL TREATMENT PROCEDURE: Jeneen Rinks A Linz received high dose ablative stereotactic body radiotherapy to the planned target volume without unforeseen complications. Treatment was delivered uneventfully. The high doses associated with stereotactic body radiotherapy and the significant potential risks require careful treatment set up and patient monitoring constituting a special treatment procedure   STEREOTACTIC TREATMENT MANAGEMENT:  Following delivery, the patient was evaluated clinically. The patient tolerated treatment without significant acute effects, and was discharged to home in stable condition.    PLAN: Continue treatment as planned.  ________________________________  Blair Promise, PhD, MD

## 2017-09-16 NOTE — Telephone Encounter (Signed)
Patient calling the office for samples of medication:   1.  What medication and dosage are you requesting samples for? Xarelo  2.  Are you currently out of this medication?  Yes

## 2017-09-18 ENCOUNTER — Ambulatory Visit
Admission: RE | Admit: 2017-09-18 | Discharge: 2017-09-18 | Disposition: A | Discharge status: Home / Self Care | Payer: Medicare Other | Source: Ambulatory Visit | Attending: Radiation Oncology | Admitting: Radiation Oncology

## 2017-09-18 DIAGNOSIS — C3431 Malignant neoplasm of lower lobe, right bronchus or lung: Secondary | ICD-10-CM

## 2017-09-18 DIAGNOSIS — Z510 Encounter for antineoplastic radiation therapy: Secondary | ICD-10-CM | POA: Diagnosis not present

## 2017-09-18 NOTE — Progress Notes (Signed)
  Radiation Oncology         [phone]) [phone] ________________________________  Name: Troy Miller MRN: [id number]  Date: 09/18/2017  DOB: September 24, 1940  Stereotactic Body Radiotherapy Treatment Procedure Note  NARRATIVE:  Troy Miller was brought to the stereotactic radiation treatment machine and placed supine on the CT couch. The patient was set up for stereotactic body radiotherapy on the body fix pillow.  3D TREATMENT PLANNING AND DOSIMETRY:  The patient's radiation plan was reviewed and approved prior to starting treatment.  It showed 3-dimensional radiation distributions overlaid onto the planning CT.  [facility] for the target structures as well as the organs at risk were reviewed. The documentation of this is filed in the radiation oncology EMR.  SIMULATION VERIFICATION:  The patient underwent CT imaging on the treatment unit.  These were carefully aligned to document that the ablative radiation dose would cover the target volume and maximally spare the nearby organs at risk according to the planned distribution.  SPECIAL TREATMENT PROCEDURE: Troy Miller received high dose ablative stereotactic body radiotherapy to the planned target volume without unforeseen complications. Treatment was delivered uneventfully. The high doses associated with stereotactic body radiotherapy and the significant potential risks require careful treatment set up and patient monitoring constituting a special treatment procedure   STEREOTACTIC TREATMENT MANAGEMENT:  Following delivery, the patient was evaluated clinically. The patient tolerated treatment without significant acute effects, and was discharged to home in stable condition.    PLAN: Continue treatment as planned.  ________________________________  Blair Promise, PhD, MD

## 2017-09-23 ENCOUNTER — Ambulatory Visit
Admission: RE | Admit: 2017-09-23 | Discharge: 2017-09-23 | Disposition: A | Discharge status: Home / Self Care | Payer: Medicare Other | Source: Ambulatory Visit | Attending: Radiation Oncology | Admitting: Radiation Oncology

## 2017-09-23 DIAGNOSIS — C3431 Malignant neoplasm of lower lobe, right bronchus or lung: Secondary | ICD-10-CM

## 2017-09-23 DIAGNOSIS — Z510 Encounter for antineoplastic radiation therapy: Secondary | ICD-10-CM | POA: Diagnosis not present

## 2017-09-23 NOTE — Progress Notes (Signed)
  Radiation Oncology         [phone] ________________________________  Name: Troy Miller MRN: [id number]  Date: 09/23/2017  DOB: 11/17/1940  Stereotactic Body Radiotherapy Treatment Procedure Note  NARRATIVE:  Troy Miller was brought to the stereotactic radiation treatment machine and placed supine on the CT couch. The patient was set up for stereotactic body radiotherapy on the body fix pillow.  3D TREATMENT PLANNING AND DOSIMETRY:  The patient's radiation plan was reviewed and approved prior to starting treatment.  It showed 3-dimensional radiation distributions overlaid onto the planning CT.  The [facility] for the target structures as well as the organs at risk were reviewed. The documentation of this is filed in the radiation oncology EMR.  SIMULATION VERIFICATION:  The patient underwent CT imaging on the treatment unit.  These were carefully aligned to document that the ablative radiation dose would cover the target volume and maximally spare the nearby organs at risk according to the planned distribution.  SPECIAL TREATMENT PROCEDURE: Troy Miller received high dose ablative stereotactic body radiotherapy to the planned target volume without unforeseen complications. Treatment was delivered uneventfully. The high doses associated with stereotactic body radiotherapy and the significant potential risks require careful treatment set up and patient monitoring constituting a special treatment procedure   STEREOTACTIC TREATMENT MANAGEMENT:  Following delivery, the patient was evaluated clinically. The patient tolerated treatment without significant acute effects, and was discharged to home in stable condition.    PLAN: Continue treatment as planned.  ________________________________  Blair Promise, PhD, MD

## 2017-09-24 ENCOUNTER — Encounter: Payer: Self-pay | Admitting: Radiation Oncology

## 2017-09-24 NOTE — Progress Notes (Signed)
  Radiation Oncology         [phone] ________________________________  Name: Troy Miller MRN: [id number]  Date: 09/24/2017  DOB: 1941-06-29  End of Treatment Note  Diagnosis: Clinical stage I non-small cell lung cancer presenting in the right lower lobe [squamous cell]    Indication for treatment:  Curative      Radiation treatment dates: 09/16/17-09/23/17  Site/dose:  Right lung/ 54 Gy in 3 fractions  Beams/energy:  SBRT SRT-VMAT/ 6X FFF  Narrative: The patient tolerated radiation treatment relatively well. During treatment the patient complained of a productive cough with white sputum. He noted unchanged shortness of breath. He denied pain or hemoptysis.  Plan: The patient has completed radiation treatment. The patient will return to radiation oncology clinic for routine followup in one month. I advised them to call or return sooner if they have any questions or concerns related to their recovery or treatment.  -----------------------------------  Blair Promise, PhD, MD  This document serves as a record of services personally performed by Gery Pray, MD. It was created on his behalf by Bethann Humble, a trained medical scribe. The creation of this record is based on the scribe's personal observations and the provider's statements to them. This document has been checked and approved by the attending provider.

## 2017-10-14 ENCOUNTER — Telehealth: Payer: Self-pay | Admitting: Cardiovascular Disease

## 2017-10-14 NOTE — Telephone Encounter (Signed)
New message    Patient calling the office for samples of medication:   1.  What medication and dosage are you requesting samples for?rivaroxaban (XARELTO) 20 MG TABS tablet  2.  Are you currently out of this medication? yes

## 2017-10-14 NOTE — Telephone Encounter (Signed)
Medication samples have been provided to the patient.  Drug name: xarelto 73m  Qty: 4 bottles  LOT: [id number] Exp.Date: 08/2019  Samples left at front desk for patient pick-up. Patient notified.  ESheral ApleyM 11:12 AM 10/14/2017

## 2017-10-20 ENCOUNTER — Encounter: Payer: Self-pay | Admitting: Surgery

## 2017-10-20 ENCOUNTER — Ambulatory Visit
Admission: RE | Admit: 2017-10-20 | Discharge: 2017-10-20 | Disposition: A | Discharge status: Home / Self Care | Payer: Medicare Other | Source: Ambulatory Visit | Attending: Surgery | Admitting: Surgery

## 2017-10-20 ENCOUNTER — Ambulatory Visit (INDEPENDENT_AMBULATORY_CARE_PROVIDER_SITE_OTHER): Payer: Medicare Other | Admitting: Surgery

## 2017-10-20 ENCOUNTER — Other Ambulatory Visit: Payer: Self-pay

## 2017-10-20 VITALS — BP 121/70 | HR 55 | Resp 20 | Ht 71.0 in | Wt 194.0 lb

## 2017-10-20 DIAGNOSIS — I714 Abdominal aortic aneurysm, without rupture, unspecified: Secondary | ICD-10-CM

## 2017-10-20 DIAGNOSIS — I724 Aneurysm of artery of lower extremity: Secondary | ICD-10-CM | POA: Diagnosis not present

## 2017-10-20 DIAGNOSIS — I739 Peripheral vascular disease, unspecified: Secondary | ICD-10-CM

## 2017-10-20 MED ORDER — IOPAMIDOL (ISOVUE-370) INJECTION 76%
125.0000 mL | Freq: Once | INTRAVENOUS | Status: AC | PRN
  Administered 2017-10-20: 125 mL via INTRAVENOUS

## 2017-10-20 NOTE — Progress Notes (Signed)
Established Intermittent Claudication   History of Present Illness   Troy Miller is a 77 y.o. ([birth year]) male who returns to clinic to go over CTA aorta with runoff.  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 Trula Slade on 02/18/17.  Surgical history is also significant for right mid superficial femoral to tibial bypass with vein conduit which was performed in [place] in 1996.  He is being followed closely due to a known aneurysmal dilatation at the proximal anastomosis of his right lower extremity bypass.  He denies any claudication, rest pain, discoloration including blue toe, or other ischemic tissue changes since last office visit.  He continues to take Xarelto for atrial flutter.  He is also on a statin and aspirin regimen daily.  Patient also is known to have an abdominal aortic aneurysm as well as bilateral common iliac artery aneurysms.  He denies any new or changing abdominal or back pain.  Patient states that he follows regularly with his PCP for management of hypertension among other chronic medical conditions.  He is a former smoker.  The patient's PMH, PSH, SH, and FamHx were reviewed on 10/20/17 are unchanged from prior visit.  Current Outpatient Medications  Medication Sig Dispense Refill  . acetaminophen (TYLENOL) 500 MG tablet Take 1,000 mg by mouth every 6 (six) hours as needed for moderate pain or headache.     Marland Kitchen aspirin 81 MG tablet Take 81 mg by mouth daily.    . calcium carbonate (OS-CAL) 600 MG TABS Take 600 mg by mouth 2 (two) times daily with a meal.    . Cholecalciferol (VITAMIN D) 2000 units CAPS Take 2,000 Units by mouth daily.    . famotidine (PEPCID) 20 MG tablet TAKE 1 TABLET BY MOUTH EVERYDAY AT BEDTIME (Patient taking differently: TAKE 20 MG BY MOUTH EVERYDAY AT BEDTIME) 90 tablet 0  . folic acid (FOLVITE) 1 MG tablet Take 1 mg by mouth every evening.     . niacin (NIASPAN) 500 MG CR tablet Take  2,000 mg by mouth every evening.     Marland Kitchen oxyCODONE-acetaminophen (PERCOCET) 10-325 MG tablet Take 1 tablet by mouth 3 (three) times daily as needed for severe pain.    . pantoprazole (PROTONIX) 40 MG tablet TAKE 1 TABLET BY MOUTH DAILY 30 TO 60 MINUTES BEFORE FIRST MEAL OF THE DAY (Patient taking differently: TAKE 40 MG BY MOUTH DAILY 30 TO 60 MINUTES BEFORE FIRST MEAL OF THE DAY) 90 tablet 0  . Pirfenidone (ESBRIET) 267 MG CAPS Take 801 mg by mouth 3 (three) times daily.     . rivaroxaban (XARELTO) 20 MG TABS tablet Take 1 tablet (20 mg total) by mouth every morning. 28 tablet 0  . simvastatin (ZOCOR) 20 MG tablet TAKE 1 TABLET BY MOUTH EVERY DAY IN THE EVENING (Patient taking differently: TAKE 20 MG BY MOUTH EVERY DAY IN THE EVENING) 30 tablet 6  . valsartan-hydrochlorothiazide (DIOVAN-HCT) 80-12.5 MG tablet TAKE 1 TABLET BY MOUTH EVERY DAY 30 tablet 6   No current facility-administered medications for this visit.     On ROS today: 10 system review of systems is negative unless otherwise noted in HPI   Physical Examination   Vitals:   10/20/17 1258  BP: 121/70  Pulse: (!) 55  Resp: 20  SpO2: 94%  Weight: 194 lb (88 kg)  Height: _0  (1.803 m)   Body mass index is 27.06 kg/m.  General Alert, O x 3,  WD, NAD  Pulmonary Sym exp, good B air movt,  Cardiac RRR, Nl S1, S2,   Vascular Vessel Right Left  Radial Palpable Palpable  Brachial not examined note examined  Carotid Faint bruit   Aorta Not palpable N/A  Femoral Palpable Palpable  Popliteal Not palpable Not palpable  PT Palpable Faintly palpable  DP Not palpable Faintly palpable    Gastro- intestinal soft, non-distended, non-tender to palpation,   Musculo- skeletal M/S 5/5 throughout  , Extremities without ischemic changes  , No edema present, Varicosities present: both upper and lower legs without ulceration; venous congestion noted BLE, No Lipodermatosclerosis present  Neurologic , Motor exam as listed above     Non-Invasive Vascular imaging    CTA performed 10/20/17 awaiting final read however findings were discussed over the phone with Dr. Earleen Newport   Medical Decision Making   Troy Miller is a 78 y.o. 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; No indication for endovascular or surgical repair at this time as this remains asymptomatic and stable in appearance  Recently placed Viabahn stent LLE not well visualized on CTA however no obvious enlargement  We will repeat CTA aorta with runoff in 6 months  Stable AAA and iliac aneurysms, no indication for repair at this time  We will also repeat carotid duplex prior to next office visit  Patient will return to office sooner if symptoms including claudication, rest pain, or tissue changes develop   Troy Ligas, PA-C [facility] [place]: [phone]  I agree with the above.  Have seen and evaluated patient.  I discussed the CT scan with interventional radiology.  There is concerned that the left leg stent may not be completely excluding the aneurysm however there is limited evaluation of the vascular tree below the knee given the timing of contrast.  The anastomotic aneurysm in the right proximal superficial femoral artery anastomosis appears to be unchanged.  The abdominal iliac aneurysms as well as the femoral aneurysms appear to be unchanged.  I have elected to continue with observation for now.  The patient will follow-up in 6 months.  I am going to repeat his CT scan with delayed imaging to get better visualization of what is going on below his knee.  Annamarie Major

## 2017-10-27 ENCOUNTER — Other Ambulatory Visit: Payer: Self-pay

## 2017-10-27 ENCOUNTER — Encounter: Payer: Self-pay | Admitting: Radiation Oncology

## 2017-10-27 ENCOUNTER — Ambulatory Visit
Admission: RE | Admit: 2017-10-27 | Discharge: 2017-10-27 | Disposition: A | Discharge status: Home / Self Care | Payer: Medicare Other | Source: Ambulatory Visit | Attending: Radiation Oncology | Admitting: Radiation Oncology

## 2017-10-27 VITALS — BP 114/70 | HR 65 | Temp 98.4°F | Resp 18 | Wt 193.4 lb

## 2017-10-27 DIAGNOSIS — R05 Cough: Secondary | ICD-10-CM | POA: Insufficient documentation

## 2017-10-27 DIAGNOSIS — R911 Solitary pulmonary nodule: Secondary | ICD-10-CM | POA: Diagnosis present

## 2017-10-27 DIAGNOSIS — R0602 Shortness of breath: Secondary | ICD-10-CM | POA: Insufficient documentation

## 2017-10-27 DIAGNOSIS — K5730 Diverticulosis of large intestine without perforation or abscess without bleeding: Secondary | ICD-10-CM | POA: Diagnosis not present

## 2017-10-27 DIAGNOSIS — Z923 Personal history of irradiation: Secondary | ICD-10-CM | POA: Diagnosis not present

## 2017-10-27 DIAGNOSIS — C3431 Malignant neoplasm of lower lobe, right bronchus or lung: Secondary | ICD-10-CM

## 2017-10-27 DIAGNOSIS — Z7901 Long term (current) use of anticoagulants: Secondary | ICD-10-CM | POA: Diagnosis not present

## 2017-10-27 DIAGNOSIS — K4090 Unilateral inguinal hernia, without obstruction or gangrene, not specified as recurrent: Secondary | ICD-10-CM | POA: Diagnosis not present

## 2017-10-27 DIAGNOSIS — I723 Aneurysm of iliac artery: Secondary | ICD-10-CM | POA: Insufficient documentation

## 2017-10-27 DIAGNOSIS — Z7982 Long term (current) use of aspirin: Secondary | ICD-10-CM | POA: Insufficient documentation

## 2017-10-27 DIAGNOSIS — I714 Abdominal aortic aneurysm, without rupture: Secondary | ICD-10-CM | POA: Insufficient documentation

## 2017-10-27 DIAGNOSIS — Z79899 Other long term (current) drug therapy: Secondary | ICD-10-CM | POA: Diagnosis not present

## 2017-10-27 NOTE — Progress Notes (Signed)
Troy Miller is here for a follow-up appointment.Patient denies any pain or fatigue. Patients states that he gets sob with activity. Denies any difficulty with swallowing. States that he coughs up clear phlegm.States that his appetite is ok.Denies any skin issues . Patient is currently taking Xarelto. Vitals:   10/27/17 1629  BP: 114/70  Pulse: 65  Resp: 18  Temp: 98.4 F (36.9 C)  TempSrc: Oral  SpO2: 94%  Weight: 193 lb 6 oz (87.7 kg)   Wt Readings from Last 3 Encounters:  10/27/17 193 lb 6 oz (87.7 kg)  10/20/17 194 lb (88 kg)  09/01/17 194 lb 12.8 oz (88.4 kg)

## 2017-10-27 NOTE — Progress Notes (Signed)
Radiation Oncology         [phone] ________________________________  Name: Troy Miller MRN: [id number]  Date: 10/27/2017  DOB: [birth year]  Follow-Up Visit Note  CC: [name], Lucrezia Europe, MD    ICD-10-CM   1. 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 He reports shortness of breath with activity. He also notes a productive cough with clear phlegm. He associates his coughing with allergies. He reports an adequate appetite. He denies pain, fatigue, dysphagia, or skin issues. He continues on Xarelto.     Patient underwent  CT angio on 10/20/17. This showed unchanged nodule in the left posterior measuring 6 mm. There was incomplete imaging of biopsied right lower lobe nodule.                  ALLERGIES:  is allergic to fentanyl and related.  Meds: Current Outpatient Medications  Medication Sig Dispense Refill  . aspirin 81 MG tablet Take 81 mg by mouth daily.    . calcium carbonate (OS-CAL) 600 MG TABS Take 600 mg by mouth 2 (two) times daily with a meal.    . Cholecalciferol (VITAMIN D) 2000 units CAPS Take 2,000 Units by mouth daily.    . famotidine (PEPCID) 20 MG tablet TAKE 1 TABLET BY MOUTH EVERYDAY AT BEDTIME (Patient taking differently: TAKE 20 MG BY MOUTH EVERYDAY AT BEDTIME) 90 tablet 0  . folic acid (FOLVITE) 1 MG tablet Take 1 mg by mouth every evening.     . niacin (NIASPAN) 500 MG CR tablet Take 2,000 mg by mouth every evening.     Marland Kitchen oxyCODONE-acetaminophen (PERCOCET) 10-325 MG tablet Take 1 tablet by mouth 3 (three) times daily as needed for severe pain.    . pantoprazole (PROTONIX) 40 MG tablet TAKE 1 TABLET BY MOUTH DAILY 30 TO 60 MINUTES BEFORE FIRST MEAL OF THE DAY (Patient taking  differently: TAKE 40 MG BY MOUTH DAILY 30 TO 60 MINUTES BEFORE FIRST MEAL OF THE DAY) 90 tablet 0  . Pirfenidone (ESBRIET) 267 MG CAPS Take 801 mg by mouth 3 (three) times daily.     . rivaroxaban (XARELTO) 20 MG TABS tablet Take 1 tablet (20 mg total) by mouth every morning. 28 tablet 0  . simvastatin (ZOCOR) 20 MG tablet TAKE 1 TABLET BY MOUTH EVERY DAY IN THE EVENING (Patient taking differently: TAKE 20 MG BY MOUTH EVERY DAY IN THE EVENING) 30 tablet 6  . valsartan-hydrochlorothiazide (DIOVAN-HCT) 80-12.5 MG tablet TAKE 1 TABLET BY MOUTH EVERY DAY 30 tablet 6  . acetaminophen (TYLENOL) 500 MG tablet Take 1,000 mg by mouth every 6 (six) hours as needed for moderate pain or headache.      No current facility-administered medications for this encounter.     Physical Findings: The patient is in no acute distress. Patient is alert and oriented.  weight is 193 lb 6 oz (87.7 kg). His oral temperature is 98.4 F (36.9 C). His blood pressure is 114/70 and his pulse is 65. His respiration is 18 and oxygen saturation is 94%. .  No significant changes. Lungs are clear to auscultation bilaterally. Heart has regular rate and rhythm. No palpable cervical, supraclavicular, or axillary adenopathy. Abdomen soft, non-tender, normal bowel sounds.  Lab Findings: Lab Results  Component Value Date   WBC 8.1 08/25/2017   HGB 15.3 08/25/2017   HCT 46.2 08/25/2017   MCV 95.7 08/25/2017   PLT 144 (L) 08/25/2017    Radiographic Findings: Ct Angio Ao+bifem W & Or Wo Contrast  Result Date: 10/20/2017 CLINICAL DATA:  77 year old male with a history of aneurysm disease Endovascular repair of left SFA/popliteal artery aneurysm 02/18/2017 EXAM: CT ANGIOGRAPHY OF ABDOMINAL AORTA WITH ILIOFEMORAL RUNOFF TECHNIQUE: Multidetector CT imaging of the abdomen, pelvis and lower extremities was performed using the standard protocol during bolus administration of intravenous contrast. Multiplanar CT image reconstructions and  MIPs were obtained to evaluate the vascular anatomy. CONTRAST:  1103m ISOVUE-370 IOPAMIDOL (ISOVUE-370) INJECTION 76% COMPARISON:  02/10/2017 FINDINGS: VASCULAR Aorta: Moderate atherosclerotic changes of the visualized abdominal aorta. Aneurysm disease of the abdominal aorta with infrarenal abdominal aortic aneurysm measuring 4.1 cm. This AP dimension is relatively unchanged from the CT dated 02/10/2017. No periaortic fluid or inflammatory changes. Diameter of the juxtarenal aorta measures 2.9 cm. No significant luminal plaque/thrombus of the infrarenal abdominal aorta. No dissection flap. Celiac: Mild atherosclerotic changes at the origin of the celiac artery which remains patent. Branch vessels patent. SMA: Mild atherosclerotic changes at the origin of the superior mesenteric artery. Branch vessels of the SMA are patent. Renals: Bilateral renal arteries are patent with mild atherosclerotic changes. The left renal artery is the lowest renal artery. IMA: The origin of the inferior mesenteric artery is not well evaluated. Left colic artery and the proximal superior rectal artery patent. Right lower extremity: Redemonstration of right common iliac artery aneurysm measuring 2.2 cm. Moderate atherosclerotic changes of the right iliac system. Hypogastric artery is patent with mild ectasia though no aneurysm. External iliac artery tortuous without significant stenosis or aneurysm. Common femoral artery with mild atherosclerotic changes. Profunda femoris patent as well as the branch vessels. Moderate to advanced atherosclerotic changes of the right superficial femoral artery. Redemonstration of occlusion in the distal segment with patent bypass graft of femoral distal bypass graft. There is persisting pseudoaneurysm at the proximal anastomosis, partially thrombosed. Greatest diameter on the axial images of the pseudoaneurysm measures approximately 3.2 cm. Pseudoaneurysm was present previously, incompletely characterized  given the absence of contrast in the pseudoaneurysm sac. Bypass graft appears patent to the distal anastomosis of the posterior tibial artery, however, poorly characterized given the late arrival of the contrast. Advanced calcifications of the tibial vessels, with the study nondiagnostic for determining tibial patency. Left lower extremity: Redemonstration of left common iliac artery aneurysm, measuring 3.1 cm. The aneurysm appears to compress the hypogastric artery, though does not appear to involve the hypogastric artery on the axial reformatted images. External iliac artery within normal limits with mild tortuosity and mild atherosclerotic changes. Left common femoral artery demonstrates aneurysm measuring 1.7 cm. Profunda femoris is patent. Left superficial femoral artery with moderate to advanced atherosclerotic changes. Patent stent system from the mid segment to the popliteal region where there is been exclusion of popliteal aneurysm. The distal aspect of the stent appears to terminate in aneurysm sac of the below knee popliteal artery, where there is flow within the aneurysm sac. Greatest diameter of the aneurysm at this level measures 2.1 cm. Atherosclerotic changes of the tibial vessels, though the study is nondiagnostic for assessment of the tibial vessels given the late arrival of the contrast. Veins: Tortuous superficial veins of the bilateral lower extremity. Review of the MIP images confirms the above findings. NON-VASCULAR Lower chest: Pleural plaques. 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 Unchanged nodule at the left posterior measuring 6 mm. Incomplete imaging of recently biopsied right lower lobe nodule. Hepatobiliary: Unremarkable appearance of the liver. Cholecystectomy Pancreas: Unremarkable pancreas Spleen: Unremarkable spleen Adrenals/Urinary Tract: Unremarkable appearance of the adrenal glands Right: No  hydronephrosis. Symmetric perfusion to the left. No nephrolithiasis. Unremarkable course of the right ureter. Left: No hydronephrosis. Symmetric perfusion to the right. No nephrolithiasis. Unremarkable course of the left ureter. Unremarkable appearance of the urinary bladder . Stomach/Bowel: Unremarkable appearance of the stomach. Unremarkable appearance of small bowel. No evidence of obstruction. 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 Normal appendix. Lymphatic: Multiple lymph nodes in the para-aortic nodal station, none of which are enlarged. Mesenteric: No free fluid or air. No adenopathy. Reproductive: Transverse diameter of the prostate measures 5.6 cm Other: Fat containing hernia of the right inguinal region, with short segment of small bowel. No evidence of obstruction. Musculoskeletal: No evidence of acute fracture. No bony canal narrowing. No significant degenerative changes of the hips. IMPRESSION: Redemonstration of aneurysm disease of the abdominal aorta and bilateral lower extremities. Infrarenal abdominal aortic aneurysm measures 4.1 cm, unchanged from the comparison CT. 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 Status post endovascular repair of left femoropopliteal aneurysm, with patent stent graft. The distal stent system terminates in a segment of the distal aneurysm measuring 2.1 cm, likely representing progression of aneurysmal disease. Although contrast bolus is delayed to this segment, the below knee popliteal artery does appear patent. Redemonstration right femoral-distal bypass, from mid SFA to the posterior tibial artery. Redemonstration of partially thrombosed pseudoaneurysm at the proximal anastomosis, with estimated greatest diameter of approximately 3.2 cm adjacent to the sartorius muscle. The diameter of the patent pseudoaneurysm at this location is unchanged from the prior, approximately 2.4 cm. Bilateral tibial arterial  disease, incompletely characterized given late arrival of the contrast bolus to the ankles. The above results were discussed by telephone at the time of interpretation on 10/20/2017 at 2:16 pm with Dr. Annamarie Major , who verbally acknowledged these results. Right-sided fat containing inguinal hernia, with short segment of small bowel and no evidence of obstruction. Diverticular disease. Extensive chronic changes of the bilateral lung bases with associated pleural plaques, potentially secondary to prior specific exposure. Incomplete imaging of recently biopsied right lower lobe nodule. Correlation with pathology recommended. Signed, Dulcy Fanny. Earleen Newport, DO [facility] Radiology Electronically Signed   By: Corrie Mckusick D.O.   On: 10/20/2017 14:18    Impression:  Patient is clinically stable one month out from SBRT. The patient had a recent CT angio for follow-up of aneurysm. His right lower lobe nodule appeared relatively unchanged at this point.  Plan: Follow-up in radiation oncology in 3 months with a chest CT scan ordered soon afterward.   ____________________________________  This document serves as a record of services personally performed by Gery Pray, MD. It was created on his behalf by Bethann Humble, a trained medical scribe. The creation of this record is based on the scribe's personal observations and the provider's statements to them. This document has been checked and approved by the attending provider.

## 2017-10-28 ENCOUNTER — Telehealth: Payer: Self-pay | Admitting: *Deleted

## 2017-10-28 NOTE — Telephone Encounter (Signed)
CALLED PATIENT TO INFORM OF FU APPT. ON 01-26-18 @ 3:45 PM, SPOKE WITH PATIENT AND HE IS AWARE OF THIS APPT.

## 2017-10-31 ENCOUNTER — Telehealth: Payer: Self-pay | Admitting: Internal Medicine

## 2017-10-31 NOTE — Telephone Encounter (Signed)
Ramaswamy pt, not a Ramachandran pt. atc pt X3, line rang to fast busy signal.  Wcb.

## 2017-11-03 ENCOUNTER — Telehealth: Payer: Self-pay | Admitting: Internal Medicine

## 2017-11-03 NOTE — Telephone Encounter (Signed)
Attempted to contact pt. Received a fast busy signal x2. Will try back.

## 2017-11-03 NOTE — Telephone Encounter (Signed)
ATC pt and emergency contact number. Both lines rang busy. When pt calls back will need call routed to triage as it is difficult to reach pt when we call back.  Will need to see if pt has pt assistance for Esbriet. Per past phone notes pt's Esbriet was approved in 07/2017.

## 2017-11-04 MED ORDER — PIRFENIDONE 267 MG PO TABS: 3.0000 | ORAL_TABLET | Freq: Three times a day (TID) | ORAL | 0 refills | Status: DC

## 2017-11-04 NOTE — Telephone Encounter (Signed)
Pt is calling [phone]

## 2017-11-04 NOTE — Telephone Encounter (Signed)
Attempted to call patient back X2 and get a busy signal.

## 2017-11-04 NOTE — Telephone Encounter (Signed)
Pt is returning call. Cb is [phone].

## 2017-11-04 NOTE — Telephone Encounter (Signed)
Attempted to contact pt. Received a fast busy signal x2. Will try back.  

## 2017-11-04 NOTE — Telephone Encounter (Signed)
Per chart pt has been getting this through [facility].  There is also another note in chart regarding a medication problem for this pt. Called [facility], states that the pt has been receiving financial assistance for his copay, but the grant money allowed has been exhausted for the year.  [facility] has a copay assistance department that is looking into other avenues for financial assistance as copay is over $300/month.  The financial dept states that they are able to override the copay for a month while they work on assistance and pt is scheduled to have a shipment sent out on 5/2.  Pt will be contacted by financial dept for further info regarding this.    atc X2 for pt to make aware, line rang to fast busy signal. Samples of Esbriet left up front for pt.

## 2017-11-05 NOTE — Telephone Encounter (Signed)
ATC patient x2, call could not go through. Will attempt to call back later.

## 2017-11-05 NOTE — Telephone Encounter (Signed)
Patient returned phone call, pt contact # [phone], pt states it is best to call from a cell phone and not the clinic phone this is the best to get through

## 2017-11-05 NOTE — Telephone Encounter (Signed)
Spoke with pt, aware of situation with [facility] and that sample has been left up front for pt.  Pt asking me to forward this to MR to see if he has any other suggestions for financial assistance.  MR please advise.  Thanks.

## 2017-11-06 ENCOUNTER — Telehealth: Payer: Self-pay | Admitting: Cardiovascular Disease

## 2017-11-06 NOTE — Telephone Encounter (Signed)
Notified pt that samples are available for pick up.  Xarelto 20 mg Qty: 2 bottles Lot # [id number] Exp: 4/21

## 2017-11-06 NOTE — Telephone Encounter (Signed)
New message ° ° ° °Patient calling the office for samples of medication: ° ° °1.  What medication and dosage are you requesting samples for?rivaroxaban (XARELTO) 20 MG TABS tablet ° °2.  Are you currently out of this medication? yes ° ° °

## 2017-11-06 NOTE — Telephone Encounter (Signed)
There is a duplicate message open on this matter.  See telephone encounter (11/03/2017).

## 2017-11-10 NOTE — Telephone Encounter (Signed)
He was susppoised to come for 3 month followu;p. Can you set that up please? Also, if I am understanding he is not able to afford esbriet co pay. This might be an issue of him having to do charity process again. Not sure. Please forward this piece about copay to Raquel Sarna so she can handle it

## 2017-11-10 NOTE — Telephone Encounter (Signed)
MR please advise on this, thanks.

## 2017-11-10 NOTE — Telephone Encounter (Signed)
Attempted to call patient to schedule 3 month ROV that is overdue. Have only reached busy signal and unable to leave message.   Raquel Sarna can you please follow up on the ROV and the [place]?  Thank you!

## 2017-11-12 NOTE — Telephone Encounter (Signed)
Attempted to call patient but only received a busy signal. Unable to leave message.

## 2017-11-12 NOTE — Telephone Encounter (Signed)
Pt's phone number is one that for some reason always rings busy when we try to call using our office phones.  His is one number that we have to call using our cell phones.  Will try to see if I can reach pt later.

## 2017-11-13 NOTE — Telephone Encounter (Signed)
ATC pt on office phone but the both pt's number rang busy. Called pt on my cell phone and was able to reach pt. Appt made with MR on 12/30/17 at 1430. Pt states he has yet to receive a shipment from [facility] but did pick up his sample from the office.    Called [facility] and they were unaware about the waived co-pay and did not presume to think [facility] could provide a month of medication without copay. [facility] at [phone] and was advised pt could receive his monthly Esbriet with $0 co-pay. They are faxing the enrollment form to Emily's attention to complete and fax back.   Will forward to [facility] to follow up on. Thanks.

## 2017-11-18 ENCOUNTER — Other Ambulatory Visit: Payer: Self-pay | Admitting: Pulmonary Disease

## 2017-11-19 NOTE — Telephone Encounter (Signed)
Troy Miller please advise if you have received these enrollment forms for pt.  Thanks!

## 2017-11-19 NOTE — Telephone Encounter (Signed)
Forms have been received from [facility] and pt has been notified that we have received the forms.  Pt stated he would come by office at his earliest convenience to sign the forms so that we can refax them for reenrollment.  Will await pt to come by office so I can get him to fill his portion out on the forms. Stated to pt to ask for me by name so I can bring the forms out there for him to sign.

## 2017-11-20 ENCOUNTER — Telehealth: Payer: Self-pay | Admitting: Cardiovascular Disease

## 2017-11-20 NOTE — Telephone Encounter (Signed)
Patient calling the office for samples of medication:   1.  What medication and dosage are you requesting samples for?Xarelto 7m  2.  Are you currently out of this medication? NO

## 2017-11-20 NOTE — Telephone Encounter (Signed)
Notified pt that samples are available for pick up at the front desk.  Xarelto 20 mg Qty: 4 bottles Lot# [id number] Exp: 4/21

## 2017-11-21 MED ORDER — PIRFENIDONE 267 MG PO TABS: 3.0000 | ORAL_TABLET | Freq: Three times a day (TID) | ORAL | 0 refills | Status: DC

## 2017-11-21 NOTE — Telephone Encounter (Signed)
Faxed all the missing information directly to the pharmacy fax that was provided.

## 2017-11-21 NOTE — Telephone Encounter (Signed)
Pt came in and signed the enrollment form. Pt was also given a sample of Esbriet as he only has enough tablets for about 6 days.  Paperwork has been faxed to [employer] for pt. Will await a response from [place].

## 2017-11-21 NOTE — Telephone Encounter (Signed)
Troy Miller from [facility] called - they received the enrollment form, but it needs to be faxed directly to the pharmacy at [phone]. Also, the form is missing some things: 1. Missing signature date for the patient  2. Missing patient insurance type 3. Shipping option She said to have that information added before re-faxing. Troy Miller can be reached at [phone] with any questions -pr

## 2017-11-25 NOTE — Telephone Encounter (Signed)
Raquel Sarna please advise if there is anything else you need triage to do for this. Thanks!

## 2017-11-26 ENCOUNTER — Telehealth: Payer: Self-pay | Admitting: Internal Medicine

## 2017-11-26 MED ORDER — DOXYCYCLINE HYCLATE 100 MG PO TABS: 100.0000 mg | ORAL_TABLET | Freq: Two times a day (BID) | ORAL | 0 refills | Status: DC

## 2017-11-26 MED ORDER — PREDNISONE 10 MG PO TABS: ORAL_TABLET | ORAL | 0 refills | Status: DC

## 2017-11-26 NOTE — Telephone Encounter (Signed)
Pt is aware of below message and voiced his understanding. Rx for prednisone & doxy has been sent to preferred pharmacy. Pt states he will keep scheduled acute visit for 11/28/17, if having improvement in symptoms with abx and pred he will call to cancel.  Nothing further is needed.

## 2017-11-26 NOTE — Telephone Encounter (Signed)
  Take prednisone 40 mg daily x 2 days, then 31m daily x 2 days, then 152mdaily x 2 days, then 76m6maily x 2 days and stop  Take doxycycline 100m87m twice daily x 5 days; take after meals and avoid sunlight   Allergies  Allergen Reactions  . Fentanyl And Related Other (See Comments)    Hallucinations

## 2017-11-26 NOTE — Telephone Encounter (Signed)
Called and spoke to pt.  Pt states he is experiencing increased sob & prod cough with clear to yellow mucus.   Pt has been scheduled for acute visit with MR on 11/28/17.  Routing to MR to make aware.

## 2017-11-26 NOTE — Telephone Encounter (Signed)
Pt is aware that he can continue to take Esbriet with doxy and prednisone. Nothing further is needed.

## 2017-11-26 NOTE — Telephone Encounter (Signed)
Patient returning call, he wants to know if he should continue to take Esbriet with the two new meds that were sent in to his pharmacy.  CB is [phone].

## 2017-11-28 ENCOUNTER — Encounter: Payer: Self-pay | Admitting: Internal Medicine

## 2017-11-28 ENCOUNTER — Ambulatory Visit (INDEPENDENT_AMBULATORY_CARE_PROVIDER_SITE_OTHER): Payer: Medicare Other | Admitting: Internal Medicine

## 2017-11-28 ENCOUNTER — Other Ambulatory Visit (INDEPENDENT_AMBULATORY_CARE_PROVIDER_SITE_OTHER): Payer: Medicare Other

## 2017-11-28 VITALS — BP 126/68 | HR 90 | Ht 70.5 in | Wt 190.0 lb

## 2017-11-28 DIAGNOSIS — E785 Hyperlipidemia, unspecified: Secondary | ICD-10-CM

## 2017-11-28 DIAGNOSIS — Z5181 Encounter for therapeutic drug level monitoring: Secondary | ICD-10-CM

## 2017-11-28 DIAGNOSIS — R110 Nausea: Secondary | ICD-10-CM

## 2017-11-28 DIAGNOSIS — J84112 Idiopathic pulmonary fibrosis: Secondary | ICD-10-CM

## 2017-11-28 DIAGNOSIS — J209 Acute bronchitis, unspecified: Secondary | ICD-10-CM

## 2017-11-28 LAB — BASIC METABOLIC PANEL
BUN: 23 mg/dL (ref 6–23)
CHLORIDE: 104 meq/L (ref 96–112)
CO2: 23 mEq/L (ref 19–32)
Calcium: 10.2 mg/dL (ref 8.4–10.5)
Creatinine, Ser: 1.07 mg/dL (ref 0.40–1.50)
GFR: 70.88 mL/min (ref >60.00)
GLUCOSE: 115 mg/dL — AB (ref 70–99)
POTASSIUM: 3.8 meq/L (ref 3.5–5.1)
SODIUM: 137 meq/L (ref 135–145)

## 2017-11-28 LAB — HEPATIC FUNCTION PANEL
ALT: 12 U/L (ref 0–53)
AST: 16 U/L (ref 0–37)
Albumin: 4.3 g/dL (ref 3.5–5.2)
Alkaline Phosphatase: 69 U/L (ref 39–117)
Bilirubin, Direct: 0.1 mg/dL (ref 0.0–0.3)
Total Bilirubin: 0.5 mg/dL (ref 0.2–1.2)
Total Protein: 7.7 g/dL (ref 6.0–8.3)

## 2017-11-28 NOTE — Progress Notes (Signed)
Subjective:     Patient ID: Troy Miller, male   DOB: [birth year], 77 y.o.   MRN: [id number]  HPI   OV 05/12/2017  Chief Complaint  Patient presents with  . Follow-up    Pt states that he is doing good. C/o SOB with exertion, occ. cough with clear to white mucus. Denies any CP.   Follow-up idiopathic pulmonary fibrosis started on Pirfenidone (Esbriet) and June 2018.  Follow left lower lobe lung nodule 8 mm in February 2018   He is here for routine follow-up. Last visit he did see my colleague and noticed some increased shortness of breath but currently this is settled. He is on full dose Pirfenidone (Esbriet) and is tolerating it well without any problems. He is interested in enrolling into the 1 pill format. He denies any worsening shortness of breath or cough that he does have shortness of breath with exertion relieved by rest but this is stable. His most recent liver function test was in September 2018 and normal. His creatinine was slightly elevated at 1.2 mg percent at that time. He is interested in research protocols in the future. He lives one hour away and it would be easier for him to have liver function tests monitoring locally he's not had a follow-up CT scan of the chest for his left lower lobe nodule noted in August 2080 needed have a CT angiogram that showed his lung nodule is stable compared to February 2018.    OV 08/01/2017  Chief Complaint  Patient presents with  . Follow-up    HRCT done 11/13, PET scan done 12/12, and PFT done today. Pt is currently on Esbriet. Pt states he has been doing about the same as last visit.  Occ. cough, SOB with exertion. Denies any CP.     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 Followup RLL lung nodule  In terms of his IPF: He is doing well clinically.  He says he is stable.  However when we walked him he desaturated on the second lap.  In fact pulmonary function test shows a  decline.  But he is not feeling it.  He does have portable oxygen with him at home and he does not use it.  Liver function tests in January 2019 on the seventh was normal.  He did this outside with his primary care physician and he brought the results with me for review.  At this point in time he finished 6 months of liver function test.  After this he will have liver function test every 3 months  Lung nodule: His CT scan recently showed right lower lobe lung nodule.  He had a follow-up PET scan and this shows the lung nodule to be PET hot.  I visualized the scan with Dr. Baltazar Apo and it is too far for bronchoscopy means.  The pretest probability for this being early stage non-small cell lung cancer is extremely high over 95% in my view.    Walking desaturation test on 08/01/2017 185 feet x 3 laps on ROOM AIR:  did YES desaturate. Rest pulse ox was 98%, final pulse ox was 85 at% at 2nd lap and stopped and started on 2L [place] an stayed at 96% for remainder. HR response 50/min at rest to 90 /min at peak exertion at 2nd lap when desaturated. Patient Troy Miller  Yes did Desaturate < 88% . Troy Miller yes  Desaturated </= 3% points. Troy Miller yes  did get tachyardic    OV 11/28/2017    Chief Complaint  Patient presents with  . Acute Visit    Pt has c/o SOB and cough with yellow mucus. Pt started on pred taper and abx 5/22.Pt states he hs had some indigestion after taking the doxy. Pt states since started on doxy and pred, SOB has improved. Pt is also still coughing but states mucus is turning clear now.   Troy Miller presents for follow-up of his idiopathic pulmonary fibrosis and therapeutic monitoring.  He is on Esbriet.  As of January 2019 he was having slow decline in lung function.  But he was tolerating the Pirfenidone (Esbriet) just fine.  At that time he had a PET heart right lower lobe nodule and I referred him to oncology.  Review of the notes indicate that he had 3  settings of radiation therapy and he was doing well after that.  He has a follow-up pending with radiation oncology.  On Nov 26, 2017 he called our office with acute bronchitis exacerbation and we gave him doxycycline and prednisone.  He is midway through it and is beginning to feel a lot better and is close to baseline.  He did have some nausea and stomach irritation with the doxycycline.  Review of the medication list shows that he is on Niaspan for his hyperlipidemia and also Pirfenidone (Esbriet) for his IPF both of which are prone for GI side effects although at baseline he is never had any problems with those.  At this point in time he is overall feeling good.  He uses oxygen at night and periodically on a subjective basis with exertion   Results for Troy Miller, Troy Miller (MRN [id number]) as of 08/01/2017 11:19  Ref. Range 04/14/2015 11:47 05/03/2016 12:52 12/09/2016 15:22 08/01/2017 10:04  FVC-Pre Latest Units: L 3.96 3.70 3.84 3.53  FVC-%Pred-Pre Latest Units: % 90 85 89 82   Results for Troy Miller, Troy Miller (MRN [id number]) as of 08/01/2017 11:19  Ref. Range 04/14/2015 11:47 05/03/2016 12:52 12/09/2016 15:22 08/01/2017 10:04  DLCO unc Latest Units: ml/min/mmHg 13.79 12.45 11.70 10.88  DLCO unc % pred Latest Units: % 40 36 34 32    Simple office walk 185 feet x  3 laps goal with forehead probe 11/28/2017   O2 used Room air  Number laps completed 1  Comments about pace Normal pace  Resting Pulse Ox/HR 93% and 63/min  Final Pulse Ox/HR 84% and 91/min  Desaturated </= 88% yes  Desaturated <= 3% points yes  Got Tachycardic >/= 90/min yes  Symptoms at end of test x  Miscellaneous comments desatruiated in 1 lap, corrected with 2L [place] at 1 lap      has a past medical history of AAA (abdominal aortic aneurysm) ([place]), Aortic stenosis, mild, Atrial fibrillation (HCC), Atrial flutter ([place]), CAD (coronary artery disease), DVT (deep venous thrombosis) ([place]), History of stress test, Hyperlipemia, Hypertension,  Iliac artery aneurysm ([place]), PVD (peripheral vascular disease) ([place]), and 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   reports that he quit smoking about 31 years ago. His smoking use included cigarettes. He has a 50.00 pack-year smoking history. He has never used smokeless tobacco.  Past Surgical History:  Procedure Laterality Date  . ABDOMINAL AORTOGRAM W/LOWER EXTREMITY N/A 02/18/2017   Procedure: ABDOMINAL AORTOGRAM W/LOWER EXTREMITY;  Surgeon: Serafina Mitchell, MD;  Location: [facility];  Service: Cardiovascular;  Laterality: N/A;  . BACK SURGERY  2003  . CARDIAC ELECTROPHYSIOLOGY STUDY  AND ABLATION  03/19/10  . CARDIOVERSION  08/16/08  . CHOLECYSTECTOMY  08/13/02  . CORONARY ARTERY BYPASS GRAFT  1994 & 2005  . FEMORAL-TIBIAL BYPASS GRAFT     Remote  . HERNIA REPAIR  06/03/07  . PERIPHERAL VASCULAR INTERVENTION Left 02/18/2017   Procedure: PERIPHERAL VASCULAR INTERVENTION;  Surgeon: Serafina Mitchell, MD;  Location: [facility];  Service: Cardiovascular;  Laterality: Left;  POPLITEAL  . VASECTOMY  01/28/08    Allergies  Allergen Reactions  . Fentanyl And Related Other (See Comments)    Hallucinations    Immunization History  Administered Date(s) Administered  . Influenza Split 04/20/2014  . Influenza, High Dose Seasonal PF 05/17/2016, 05/12/2017  . Influenza,inj,Quad PF,6+ Mos 03/10/2015  . Influenza-Unspecified 06/02/2012  . Pneumococcal Conjugate-13 02/23/2016    Family History  Problem Relation Age of Onset  . Lung cancer Mother        smoked  . Heart failure Father   . Stroke Sister      Current Outpatient Medications:  .  acetaminophen (TYLENOL) 500 MG tablet, Take 1,000 mg by mouth every 6 (six) hours as needed for moderate pain or headache. , Disp: , Rfl:  .  aspirin 81 MG tablet, Take 81 mg by mouth daily., Disp: , Rfl:  .  calcium carbonate (OS-CAL) 600 MG TABS, Take 600 mg by mouth 2 (two) times daily with a meal., Disp: , Rfl:  .   Cholecalciferol (VITAMIN D) 2000 units CAPS, Take 2,000 Units by mouth daily., Disp: , Rfl:  .  doxycycline (VIBRA-TABS) 100 MG tablet, Take 1 tablet (100 mg total) by mouth 2 (two) times daily., Disp: 10 tablet, Rfl: 0 .  famotidine (PEPCID) 20 MG tablet, TAKE 20 MG BY MOUTH EVERYDAY AT BEDTIME, Disp: 90 tablet, Rfl: 0 .  folic acid (FOLVITE) 1 MG tablet, Take 1 mg by mouth every evening. , Disp: , Rfl:  .  niacin (NIASPAN) 500 MG CR tablet, Take 2,000 mg by mouth every evening. , Disp: , Rfl:  .  oxyCODONE-acetaminophen (PERCOCET) 10-325 MG tablet, Take 1 tablet by mouth 3 (three) times daily as needed for severe pain., Disp: , Rfl:  .  pantoprazole (PROTONIX) 40 MG tablet, TAKE 40 MG BY MOUTH DAILY 30 TO 60 MINUTES BEFORE FIRST MEAL OF THE DAY, Disp: 90 tablet, Rfl: 0 .  Pirfenidone (ESBRIET) 267 MG TABS, Take 3 tablets (801 mg total) by mouth 3 (three) times daily., Disp: 189 tablet, Rfl: 0 .  predniSONE (DELTASONE) 10 MG tablet, 4 tabs x 2 days, 2 tabs x 2 days, 1 tab x 2 days, 0.5 tab x 2 days then stop, Disp: 15 tablet, Rfl: 0 .  rivaroxaban (XARELTO) 20 MG TABS tablet, Take 1 tablet (20 mg total) by mouth every morning., Disp: 28 tablet, Rfl: 0 .  simvastatin (ZOCOR) 20 MG tablet, TAKE 1 TABLET BY MOUTH EVERY DAY IN THE EVENING (Patient taking differently: TAKE 20 MG BY MOUTH EVERY DAY IN THE EVENING), Disp: 30 tablet, Rfl: 6 .  valsartan-hydrochlorothiazide (DIOVAN-HCT) 80-12.5 MG tablet, TAKE 1 TABLET BY MOUTH EVERY DAY, Disp: 30 tablet, Rfl: 6  Review of Systems     Objective:   Physical Exam Vitals:   11/28/17 0917  BP: 126/68  Pulse: 90  SpO2: 100%  Weight: 190 lb (86.2 kg)  Height: 5' 10.5" (1.791 m)    Estimated body mass index is 26.88 kg/m as calculated from the following:   Height as of this encounter: 5' 10.5" (  1.791 m).   Weight as of this encounter: 190 lb (86.2 kg).     Assessment:       ICD-10-CM   1. Acute bronchitis, unspecified organism J20.9   2. IPF  (idiopathic pulmonary fibrosis) (HCC) J17.915 Pulmonary function test    Hepatic function panel    CK Total (and CKMB)  3. Therapeutic drug monitoring Z51.81 Hepatic function panel  4. Hyperlipidemia, unspecified hyperlipidemia type E78.5   5. Nausea R11.0        Plan:     Acute bronchitis, unspecified organism   = glad better - complete antibiotic and prednisone from 11/26/17 per course  IPF (idiopathic pulmonary fibrosis) ([place]) Therapeutic drug monitoring - in jan 2019 mildly progressed since Oct 2016 - glad you feel stable jan 2019 through May 2019 - check LFT 11/28/2017  - continue esbriet  Hyperlipidemia, unspecified hyperlipidemia type - please talk to your cadiologist or PCP [name], PA-C - not sure Niaspan is effective in cardiac risk reduction anymore - check CK blood work 11/28/2017   Nausea - mild current and due to doxyucycline but underlying niaspan can be making it easier for you to get Gi side effects  - talk to PCP [name], PA-C if you can come off niaspan  Followup - in  26month do Pre-bd spiro and dlco only. No lung volume or bd response. No post-bd spiro - return to ILD clinic in 3 month   Dr. [name] M.D., FCentral Washington HospitalC.P Pulmonary and Critical Care Medicine Staff Physician, CRiverdaleDirector - Interstitial Lung Disease  Program  Pulmonary [place] [address] Pager: [phone] If no answer or between  15:00h - 7:00h: call [phone] Telephone: [phone]

## 2017-11-28 NOTE — Telephone Encounter (Signed)
Pt came to office today, 5/24 for an acute visit. I asked pt if he had heard anything regarding paperwork from [facility].  Pt stated he has not heard anything so at this time, we are still waiting on an update.

## 2017-11-28 NOTE — Patient Instructions (Addendum)
Acute bronchitis, unspecified organism   = glad better - complete antibiotic and prednisone from 11/26/17 per course  IPF (idiopathic pulmonary fibrosis) ([place]) Therapeutic drug monitoring - in jan 2019 mildly progressed since Oct 2016 - glad you feel stable jan 2019 through May 2019 - check LFT 11/28/2017  - continue esbriet  Hyperlipidemia, unspecified hyperlipidemia type - please talk to your cadiologist or PCP [name], PA-C - not sure Niaspan is effective in cardiac risk reduction anymore - check CK blood work 11/28/2017   Nausea - mild current and due to doxyucycline but underlying niaspan can be making it easier for you to get Gi side effects  - talk to PCP [name], PA-C if you can come off niaspan  Followup - in  16month do Pre-bd spiro and dlco only. No lung volume or bd response. No post-bd spiro - return to ILD clinic in 3 month

## 2017-11-29 LAB — CK TOTAL AND CKMB (NOT AT ARMC)
CK TOTAL: 55 U/L (ref 44–196)
CK, MB: 3.1 ng/mL (ref 0–5.0)
Relative Index: 5.6 — ABNORMAL HIGH (ref 0–4.0)

## 2017-12-02 NOTE — Telephone Encounter (Signed)
I tried to call pt but phone rang bust X 3. Will try again later.

## 2017-12-03 ENCOUNTER — Telehealth: Payer: Self-pay | Admitting: Cardiovascular Disease

## 2017-12-03 ENCOUNTER — Telehealth: Payer: Self-pay | Admitting: Internal Medicine

## 2017-12-03 NOTE — Telephone Encounter (Signed)
Let Troy Miller A [name]  Know blood work is normal but he still should talk to [name], PA-C or cardiologist and see if he really should be on NIASPAN.      PULMONARY No results for input(s): PHART, PCO2ART, PO2ART, HCO3, TCO2, O2SAT in the last 168 hours.  Invalid input(s): PCO2, PO2  CBC No results for input(s): HGB, HCT, WBC, PLT in the last 168 hours.  COAGULATION No results for input(s): INR in the last 168 hours.  CARDIAC  No results for input(s): TROPONINI in the last 168 hours. No results for input(s): PROBNP in the last 168 hours.   CHEMISTRY Recent Labs  Lab 11/28/17 1025  NA 137  K 3.8  CL 104  CO2 23  GLUCOSE 115*  BUN 23  CREATININE 1.07  CALCIUM 10.2   Estimated Creatinine Clearance: 59.7 mL/min (by C-G formula based on SCr of 1.07 mg/dL).   LIVER Recent Labs  Lab 11/28/17 1025  AST 16  ALT 12  ALKPHOS 69  BILITOT 0.5  PROT 7.7  ALBUMIN 4.3     INFECTIOUS No results for input(s): LATICACIDVEN, PROCALCITON in the last 168 hours.   ENDOCRINE CBG (last 3)  No results for input(s): GLUCAP in the last 72 hours.       IMAGING x48h  - image(s) personally visualized  -   highlighted in bold No results found.

## 2017-12-03 NOTE — Telephone Encounter (Signed)
Pt's phone number is one that will always ring a busy signal with our phones at work due to the area code and it is a number we have to call using our cell phones.  Received a fax from [facility] stating that pt had been approved to receive Esbriet free of charge.  Attempted to call pt to discuss this with him to see if he ahd been able to receive his next shipment of Esbriet but unable to speak to pt.  Left message for pt to return call x1.  When pt does return call, pt does need to be told information from phone message that was started by MR today regarding pt's labwork.

## 2017-12-03 NOTE — Telephone Encounter (Signed)
New Message       [facility] Pre-operative Risk Assessment    Request for surgical clearance:  1. What type of surgery is being performed? Several Dental Extractions   2. When is this surgery scheduled? 12/17/2017    3. What type of clearance is required (medical clearance vs. Pharmacy clearance to hold med vs. Both)? Both   4. Are there any medications that need to be held prior to surgery and how long? Xarelto, doctor recommendation   5. Practice name and name of physician performing surgery? [facility] and Dr. Kris Hartmann   6. What is your office phone number [phone]   7.   What is your office fax number [phone] or [phone]  8.   Anesthesia type (None, local, MAC, general) ? General    Troy Miller 12/03/2017, 3:49 PM  _________________________________________________________________   (provider comments below)

## 2017-12-04 NOTE — Telephone Encounter (Signed)
   Primary Cardiologist: Shelva Majestic, MD (Last seen 03/28/2017)   Chart reviewed as part of pre-operative protocol coverage. Will review medical clearance if needed during office visit with Dr. Claiborne Billings 12/31/2017.  I will route this recommendation to the requesting party via Epic fax function and remove from pre-op pool.  Please call with questions.  [place], [place] 12/04/2017, 3:42 PM

## 2017-12-04 NOTE — Telephone Encounter (Signed)
Pt takes Xarelto for aflutter with CHADS2VASc score of 5 (age x2, HTN, CHF, CAD). Also has history of DVT. Renal function is normal. Recommend holding Xarelto for 24 hours prior to dental work.

## 2017-12-04 NOTE — Telephone Encounter (Signed)
Patient returned call, call dropped before I could get call back number.

## 2017-12-04 NOTE — Telephone Encounter (Signed)
Attempted to call patient, no answer, message left to call back.  

## 2017-12-04 NOTE — Telephone Encounter (Signed)
Spoke with patient. He is aware of results and stated that he will talk to his cardiologist about the Niaspan.   Nothing else needed at time of call.

## 2017-12-05 NOTE — Telephone Encounter (Signed)
Attempted to call Patient. Message left for him to call back.

## 2017-12-09 NOTE — Telephone Encounter (Signed)
Spoke with pt, he was advised his lab work results. He stated that he did get in touch with patient assistance and he is waiting on his shipment. I advised him to call us if there was anything he needed [place] to do. Pt understood and nothing further is needed.

## 2017-12-18 ENCOUNTER — Other Ambulatory Visit: Payer: Self-pay

## 2017-12-19 ENCOUNTER — Other Ambulatory Visit: Payer: Self-pay | Admitting: Surgery

## 2017-12-19 DIAGNOSIS — I714 Abdominal aortic aneurysm, without rupture, unspecified: Secondary | ICD-10-CM

## 2017-12-25 ENCOUNTER — Telehealth: Payer: Self-pay | Admitting: Cardiovascular Disease

## 2017-12-25 MED ORDER — RIVAROXABAN 20 MG PO TABS: 20.0000 mg | ORAL_TABLET | Freq: Every morning | ORAL | 0 refills | Status: DC

## 2017-12-25 NOTE — Telephone Encounter (Signed)
New message ° ° ° °Patient calling the office for samples of medication: ° ° °1.  What medication and dosage are you requesting samples for?rivaroxaban (XARELTO) 20 MG TABS tablet ° °2.  Are you currently out of this medication? yes ° ° °

## 2017-12-25 NOTE — Telephone Encounter (Signed)
Spoke to patient. Sample available for pick up 2 weeks. Patient aware and will pick up tomorrow.

## 2017-12-26 ENCOUNTER — Ambulatory Visit (HOSPITAL_COMMUNITY): Payer: Medicare Other | Attending: Cardiovascular Disease

## 2017-12-26 ENCOUNTER — Other Ambulatory Visit: Payer: Self-pay

## 2017-12-26 ENCOUNTER — Other Ambulatory Visit: Payer: Self-pay | Admitting: Cardiovascular Disease

## 2017-12-26 DIAGNOSIS — I2510 Atherosclerotic heart disease of native coronary artery without angina pectoris: Secondary | ICD-10-CM | POA: Insufficient documentation

## 2017-12-26 DIAGNOSIS — R0600 Dyspnea, unspecified: Secondary | ICD-10-CM | POA: Insufficient documentation

## 2017-12-26 DIAGNOSIS — Z87891 Personal history of nicotine dependence: Secondary | ICD-10-CM | POA: Insufficient documentation

## 2017-12-26 DIAGNOSIS — I10 Essential (primary) hypertension: Secondary | ICD-10-CM | POA: Insufficient documentation

## 2017-12-26 DIAGNOSIS — J61 Pneumoconiosis due to asbestos and other mineral fibers: Secondary | ICD-10-CM | POA: Diagnosis not present

## 2017-12-26 DIAGNOSIS — C3490 Malignant neoplasm of unspecified part of unspecified bronchus or lung: Secondary | ICD-10-CM | POA: Insufficient documentation

## 2017-12-26 DIAGNOSIS — E785 Hyperlipidemia, unspecified: Secondary | ICD-10-CM | POA: Diagnosis not present

## 2017-12-26 DIAGNOSIS — R001 Bradycardia, unspecified: Secondary | ICD-10-CM | POA: Diagnosis not present

## 2017-12-26 DIAGNOSIS — I350 Nonrheumatic aortic (valve) stenosis: Secondary | ICD-10-CM

## 2017-12-26 DIAGNOSIS — I4892 Unspecified atrial flutter: Secondary | ICD-10-CM | POA: Diagnosis not present

## 2017-12-26 DIAGNOSIS — I714 Abdominal aortic aneurysm, without rupture, unspecified: Secondary | ICD-10-CM

## 2017-12-26 DIAGNOSIS — I739 Peripheral vascular disease, unspecified: Secondary | ICD-10-CM | POA: Diagnosis not present

## 2017-12-26 DIAGNOSIS — G4733 Obstructive sleep apnea (adult) (pediatric): Secondary | ICD-10-CM | POA: Insufficient documentation

## 2017-12-30 ENCOUNTER — Ambulatory Visit (INDEPENDENT_AMBULATORY_CARE_PROVIDER_SITE_OTHER)
Admission: RE | Admit: 2017-12-30 | Discharge: 2017-12-30 | Disposition: A | Discharge status: Home / Self Care | Payer: Medicare Other | Source: Ambulatory Visit | Attending: Internal Medicine | Admitting: Internal Medicine

## 2017-12-30 ENCOUNTER — Ambulatory Visit (INDEPENDENT_AMBULATORY_CARE_PROVIDER_SITE_OTHER): Payer: Medicare Other | Admitting: Internal Medicine

## 2017-12-30 ENCOUNTER — Encounter: Payer: Self-pay | Admitting: Internal Medicine

## 2017-12-30 ENCOUNTER — Telehealth: Payer: Self-pay | Admitting: Internal Medicine

## 2017-12-30 VITALS — BP 118/62 | HR 64 | Ht 70.5 in | Wt 185.2 lb

## 2017-12-30 DIAGNOSIS — J9611 Chronic respiratory failure with hypoxia: Secondary | ICD-10-CM

## 2017-12-30 DIAGNOSIS — C3491 Malignant neoplasm of unspecified part of right bronchus or lung: Secondary | ICD-10-CM

## 2017-12-30 DIAGNOSIS — Z5181 Encounter for therapeutic drug level monitoring: Secondary | ICD-10-CM | POA: Diagnosis not present

## 2017-12-30 DIAGNOSIS — R911 Solitary pulmonary nodule: Secondary | ICD-10-CM

## 2017-12-30 DIAGNOSIS — J84112 Idiopathic pulmonary fibrosis: Secondary | ICD-10-CM

## 2017-12-30 DIAGNOSIS — R0789 Other chest pain: Secondary | ICD-10-CM

## 2017-12-30 DIAGNOSIS — I2510 Atherosclerotic heart disease of native coronary artery without angina pectoris: Secondary | ICD-10-CM | POA: Diagnosis not present

## 2017-12-30 NOTE — Progress Notes (Signed)
Subjective:     Patient ID: Troy Miller, male   DOB: 07-10-1940, 77 y.o.   MRN: [id number]  HPI  OV 05/12/2017  Chief Complaint  Patient presents with  . Follow-up    Pt states that he is doing good. C/o SOB with exertion, occ. cough with clear to white mucus. Denies any CP.   Follow-up idiopathic pulmonary fibrosis started on Pirfenidone (Esbriet) and June 2018.  Follow left lower lobe lung nodule 8 mm in February 2018   He is here for routine follow-up. Last visit he did see my colleague and noticed some increased shortness of breath but currently this is settled. He is on full dose Pirfenidone (Esbriet) and is tolerating it well without any problems. He is interested in enrolling into the 1 pill format. He denies any worsening shortness of breath or cough that he does have shortness of breath with exertion relieved by rest but this is stable. His most recent liver function test was in September 2018 and normal. His creatinine was slightly elevated at 1.2 mg percent at that time. He is interested in research protocols in the future. He lives one hour away and it would be easier for him to have liver function tests monitoring locally he's not had a follow-up CT scan of the chest for his left lower lobe nodule noted in August 2080 needed have a CT angiogram that showed his lung nodule is stable compared to February 2018.    OV 08/01/2017  Chief Complaint  Patient presents with  . Follow-up    HRCT done 11/13, PET scan done 12/12, and PFT done today. Pt is currently on Esbriet. Pt states he has been doing about the same as last visit.  Occ. cough, SOB with exertion. Denies any CP.     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 Followup RLL lung nodule  In terms of his IPF: He is doing well clinically.  He says he is stable.  However when we walked him he desaturated on the second lap.  In fact pulmonary function test shows a  decline.  But he is not feeling it.  He does have portable oxygen with him at home and he does not use it.  Liver function tests in January 2019 on the seventh was normal.  He did this outside with his primary care physician and he brought the results with me for review.  At this point in time he finished 6 months of liver function test.  After this he will have liver function test every 3 months  Lung nodule: His CT scan recently showed right lower lobe lung nodule.  He had a follow-up PET scan and this shows the lung nodule to be PET hot.  I visualized the scan with Dr. Baltazar Apo and it is too far for bronchoscopy means.  The pretest probability for this being early stage non-small cell lung cancer is extremely high over 95% in my view.    Walking desaturation test on 08/01/2017 185 feet x 3 laps on ROOM AIR:  did YES desaturate. Rest pulse ox was 98%, final pulse ox was 85 at% at 2nd lap and stopped and started on 2L  an stayed at 96% for remainder. HR response 50/min at rest to 90 /min at peak exertion at 2nd lap when desaturated. Patient Troy Miller  Yes did Desaturate < 88% . Troy Miller yes  Desaturated </= 3% points. Troy Miller yes did  get tachyardic    OV 11/28/2017    Chief Complaint  Patient presents with  . Acute Visit    Pt has c/o SOB and cough with yellow mucus. Pt started on pred taper and abx 5/22.Pt states he hs had some indigestion after taking the doxy. Pt states since started on doxy and pred, SOB has improved. Pt is also still coughing but states mucus is turning clear now.   Troy Miller  presents for follow-up of his idiopathic pulmonary fibrosis and therapeutic monitoring.  He is on Esbriet.  As of January 2019 he was having slow decline in lung function.  But he was tolerating the Pirfenidone (Esbriet) just fine.  At that time he had a PET heart right lower lobe nodule and I referred him to oncology.  Review of the notes indicate that he had 3  settings of radiation therapy and he was doing well after that.  He has a follow-up pending with radiation oncology.  On Nov 26, 2017 he called our office with acute bronchitis exacerbation and we gave him doxycycline and prednisone.  He is midway through it and is beginning to feel a lot better and is close to baseline.  He did have some nausea and stomach irritation with the doxycycline.  Review of the medication list shows that he is on Niaspan for his hyperlipidemia and also Pirfenidone (Esbriet) for his IPF both of which are prone for GI side effects although at baseline he is never had any problems with those.  At this point in time he is overall feeling good.  He uses oxygen at night and periodically on a subjective basis with exertion   OV 12/30/2017  Chief Complaint  Patient presents with  . Follow-up    Pt states he is about the same as he was at last visit. Pt still becomes SOB, has cough with mostly clear mucus, and sometimes has occ. CP/chest tightness.   Troy Miller , 77 y.o. , with dob 09/07/1940 and male ,Not Hispanic or Latino from [place] [id number] - presents to [facility]  clinic for IPF followup on Esbreit  In terms of IPF: He is overall stable. He tells me that he takes his aspirin regularly. Does not much of nausea. He is tolerating it fine. He apply sunscreen. Uses oxygen at night. He uses oxygen only at day time with exertion but on a subjective basis even though he desaturated quickly at the end of second lap in the office is documented below which is similar to before. His last liver function was May 2019 in stable. I personally review that result with a normal CK of 55 and AST are 16 and ALT of 12.  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 Radiation was in spring 2019. He is now having for the last several weeks to a few months new onset right lower lobe chest pain that is musculoskeletal.  It happens randomly in the daytime particularly when he moves but he is not associating it with a body movement is no radiation. The symptoms are mild to moderate in intensity. Is no clear cut aggravating or relieving factors. It is not present at night when he sleeping. This no weight loss or hemoptysis. Last imaging was in January 2019 at the time of diagnosis of cancer. His follow-up with radiation oncology on review of the chartshows its on 01/26/2018.     Results  for Troy Miller, Troy Miller (MRN [id number]) as of 08/01/2017 11:19  Ref. Range 04/14/2015 11:47 05/03/2016 12:52 12/09/2016 15:22 08/01/2017 10:04  FVC-Pre Latest Units: L 3.96 3.70 3.84 3.53  FVC-%Pred-Pre Latest Units: % 90 85 89 82   Results for Troy Miller, Troy Miller (MRN [id number]) as of 08/01/2017 11:19  Ref. Range 04/14/2015 11:47 05/03/2016 12:52 12/09/2016 15:22 08/01/2017 10:04  DLCO unc Latest Units: ml/min/mmHg 13.79 12.45 11.70 10.88  DLCO unc % pred Latest Units: % 40 36 34 32    Simple office walk 185 feet x  3 laps goal with forehead probe 11/28/2017  12/30/2017   O2 used Room air Room air  Number laps completed 1 2 - stopeed at end due to desat  Comments about pace Normal pace slow  Resting Pulse Ox/HR 93% and 63/min 96%/61/min      Final Pulse Ox/HR 84% and 91/min 87% and 97/min  Desaturated </= 88% yes yes  Desaturated <= 3% points yes yes  Got Tachycardic >/= 90/min yes yes  Symptoms at end of test x x  Miscellaneous comments desatruiated in 1 lap, corrected with 2L [place] at 1 lap desat after 2 laps - same ass before       has a past medical history of AAA (abdominal aortic aneurysm) ([place]), Aortic stenosis, mild, Atrial fibrillation (HCC), Atrial flutter ([place]), CAD (coronary artery disease), DVT (deep venous thrombosis) ([place]), History of stress test, Hyperlipemia, Hypertension, Iliac artery aneurysm ([place]), PVD (peripheral vascular disease) ([place]), and 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   reports that  he quit smoking about 31 years ago. His smoking use included cigarettes. He has a 50.00 pack-year smoking history. He has never used smokeless tobacco.  Past Surgical History:  Procedure Laterality Date  . ABDOMINAL AORTOGRAM W/LOWER EXTREMITY N/A 02/18/2017   Procedure: ABDOMINAL AORTOGRAM W/LOWER EXTREMITY;  Surgeon: Serafina Mitchell, MD;  Location: [facility];  Service: Cardiovascular;  Laterality: N/A;  . BACK SURGERY  2003  . CARDIAC ELECTROPHYSIOLOGY STUDY AND ABLATION  03/19/10  . CARDIOVERSION  08/16/08  . CHOLECYSTECTOMY  08/13/02  . CORONARY ARTERY BYPASS GRAFT  1994 & 2005  . FEMORAL-TIBIAL BYPASS GRAFT     Remote  . HERNIA REPAIR  06/03/07  . PERIPHERAL VASCULAR INTERVENTION Left 02/18/2017   Procedure: PERIPHERAL VASCULAR INTERVENTION;  Surgeon: Serafina Mitchell, MD;  Location: [facility];  Service: Cardiovascular;  Laterality: Left;  POPLITEAL  . VASECTOMY  01/28/08    Allergies  Allergen Reactions  . Fentanyl And Related Other (See Comments)    Hallucinations    Immunization History  Administered Date(s) Administered  . Influenza Split 04/20/2014  . Influenza, High Dose Seasonal PF 05/17/2016, 05/12/2017  . Influenza,inj,Quad PF,6+ Mos 03/10/2015  . Influenza-Unspecified 06/02/2012  . Pneumococcal Conjugate-13 02/23/2016    Family History  Problem Relation Age of Onset  . Lung cancer Mother        smoked  . Heart failure Father   . Stroke Sister      Current Outpatient Medications:  .  acetaminophen (TYLENOL) 500 MG tablet, Take 1,000 mg by mouth every 6 (six) hours as needed for moderate pain or headache. , Disp: , Rfl:  .  aspirin 81 MG tablet, Take 81 mg by mouth daily., Disp: , Rfl:  .  calcium carbonate (OS-CAL) 600 MG TABS, Take 600 mg by mouth 2 (two) times daily with a meal., Disp: , Rfl:  .  Cholecalciferol (VITAMIN D) 2000 units CAPS,  Take 2,000 Units by mouth daily., Disp: , Rfl:  .  famotidine (PEPCID) 20 MG tablet, TAKE 20 MG BY  MOUTH EVERYDAY AT BEDTIME, Disp: 90 tablet, Rfl: 0 .  folic acid (FOLVITE) 1 MG tablet, Take 1 mg by mouth every evening. , Disp: , Rfl:  .  niacin (NIASPAN) 500 MG CR tablet, Take 2,000 mg by mouth every evening. , Disp: , Rfl:  .  oxyCODONE-acetaminophen (PERCOCET) 10-325 MG tablet, Take 1 tablet by mouth 3 (three) times daily as needed for severe pain., Disp: , Rfl:  .  pantoprazole (PROTONIX) 40 MG tablet, TAKE 40 MG BY MOUTH DAILY 30 TO 60 MINUTES BEFORE FIRST MEAL OF THE DAY, Disp: 90 tablet, Rfl: 0 .  Pirfenidone (ESBRIET) 267 MG TABS, Take 3 tablets (801 mg total) by mouth 3 (three) times daily., Disp: 189 tablet, Rfl: 0 .  rivaroxaban (XARELTO) 20 MG TABS tablet, Take 1 tablet (20 mg total) by mouth every morning., Disp: 14 tablet, Rfl: 0 .  simvastatin (ZOCOR) 20 MG tablet, TAKE 1 TABLET BY MOUTH EVERY DAY IN THE EVENING (Patient taking differently: TAKE 20 MG BY MOUTH EVERY DAY IN THE EVENING), Disp: 30 tablet, Rfl: 6 .  valsartan-hydrochlorothiazide (DIOVAN-HCT) 80-12.5 MG tablet, TAKE 1 TABLET BY MOUTH EVERY DAY, Disp: 30 tablet, Rfl: 6  Review of Systems     Objective:   Physical Exam  Constitutional: He is oriented to person, place, and time. He appears well-developed and well-nourished. No distress.  HENT:  Head: Normocephalic and atraumatic.  Right Ear: External ear normal.  Left Ear: External ear normal.  Mouth/Throat: Oropharynx is clear and moist. No oropharyngeal exudate.  Eyes: Pupils are equal, round, and reactive to light. Conjunctivae and EOM are normal. Right eye exhibits no discharge. Left eye exhibits no discharge. No scleral icterus.  Neck: Normal range of motion. Neck supple. No JVD present. No tracheal deviation present. No thyromegaly present.  Cardiovascular: Normal rate, regular rhythm and intact distal pulses. Exam reveals no gallop and no friction rub.  No murmur heard. Pulmonary/Chest: Effort normal. No respiratory distress. He has no wheezes. He has  rales. He exhibits no tenderness.  No chest wall tendernes or lump RLL  Abdominal: Soft. Bowel sounds are normal. He exhibits no distension and no mass. There is no tenderness. There is no rebound and no guarding.  Musculoskeletal: Normal range of motion. He exhibits no edema or tenderness.  Lymphadenopathy:    He has no cervical adenopathy.  Neurological: He is alert and oriented to person, place, and time. He has normal reflexes. No cranial nerve deficit. Coordination normal.  Skin: Skin is warm and dry. No rash noted. He is not diaphoretic. No erythema. No pallor.  Psychiatric: He has a normal mood and affect. His behavior is normal. Judgment and thought content normal.  Nursing note and vitals reviewed.  Today's Vitals   12/30/17 1412  BP: 118/62  Pulse: 64  SpO2: 93%  Weight: 185 lb 3.2 oz (84 kg)  Height: 5' 10.5" (1.791 m)    Estimated body mass index is 26.2 kg/m as calculated from the following:   Height as of this encounter: 5' 10.5" (1.791 m).   Weight as of this encounter: 185 lb 3.2 oz (84 kg).       Assessment:       ICD-10-CM   1. IPF (idiopathic pulmonary fibrosis) ([place]) J84.112   2. Chronic respiratory failure with hypoxia (HCC) J96.11   3. Therapeutic drug monitoring Z51.81  4. Squamous cell lung cancer, right (HCC) C34.91   5. Right-sided chest wall pain R07.89        Plan:     IPF (idiopathic pulmonary fibrosis) (HCC) Chronic respiratory failure with hypoxia (HCC) Therapeutic drug monitoring  - clinically stable - continue esbriet with meals and sunscreen - continue o2 at night and with exertion - in 3 months do Pre-bd spiro and dlco only. No lung volume or bd response. No post-bd spiro   Squamous cell lung cancer, right ([place]) - feb 2019 Right-sided chest wall pain - new  - do CT chest without contrast - will call with results; do anytime next few weeks but preferrably before you see Dr Sondra Come on 01/26/18  Followup - 3 months from 12/30/2017  do Pre-bd spiro and dlco only. No lung volume or bd response. No post-bd spiro - return to see DR Chase Caller in [facility] clnic in 3 months   Dr. Brand Males, M.D., [facility].C.P Pulmonary and Critical Care Medicine Staff Physician, [place] Director - Interstitial Lung Disease  Program  Pulmonary [place] at [place], [place], [address]  Pager: [phone], If no answer or between  15:00h - 7:00h: call [phone] Telephone: [phone]

## 2017-12-30 NOTE — Telephone Encounter (Signed)
Let Troy Miller kknow that  1. Right lower pain is indeed from the IPF and the radiation related scar tissue pulling on his chest. There is no new problem there  2. THer is a new lung nodule LLL very small 65m - so repeat ct chest wo contrast in 6 months  3. He can discuss CT with Dr KSondra Comein July visit  Thanks  Dr. [name] M.D., F[facility] HospitalC.P Pulmonary and Critical Care Medicine Staff Physician, CGlen AcresDirector - Interstitial Lung Disease  Program  [place] [id number] Pager: [phone] If no answer or between  15:00h - 7:00h: call [phone] Telephone: [phone]     Ct Chest Wo Contrast  Result Date: 12/30/2017 CLINICAL DATA:  Right lower lobe squamous cell lung carcinoma diagnosed February 2019, treated with SBRT completed 09/23/2017. History of IPF. EXAM: CT CHEST WITHOUT CONTRAST TECHNIQUE: Multidetector CT imaging of the chest was performed following the standard protocol without IV contrast. COMPARISON:  05/20/2017 high-resolution chest CT. 06/18/2017 PET-CT. FINDINGS: Cardiovascular: Normal heart size. No significant pericardial effusion/thickening. Three-vessel coronary atherosclerosis status post CABG. Atherosclerotic nonaneurysmal thoracic aorta. Stable borderline prominent main pulmonary artery (3.4 cm diameter). Mediastinum/Nodes: No discrete thyroid nodules. Unremarkable esophagus. No pathologically enlarged axillary, mediastinal or hilar lymph nodes, noting limited sensitivity for the detection of hilar adenopathy on this noncontrast study. Lungs/Pleura: No pneumothorax. Stable bilateral calcified pleural plaques and smooth pleural thickening. No pleural effusions. Moderate centrilobular and paraseptal emphysema with diffuse bronchial wall thickening. Right lower lobe solid 1.3 x 1.1 cm pulmonary nodule (series 3/image 103), previously 1.4 x 1.1 cm on 05/20/2017  chest CT, stable. New patchy ground-glass opacity and reticulation in the right lower lobe surrounding the treated nodule, compatible with early postradiation change. New solid 7 x 3 mm (average diameter 5 mm) left lower lobe pulmonary nodule (series 3/image 94). A few additional scattered solid pulmonary nodules in both lungs measuring up to 9 mm in the apical left upper lobe (series 3/image 25) are all stable. Patchy subpleural reticulation and ground-glass attenuation with associated mild traction bronchiectasis and mild architectural distortion in both lungs, basilar predominant, possibly slightly worsened. Stable minimal dependent honeycombing in lower lobes. Upper abdomen: Cholecystectomy. Musculoskeletal: No aggressive appearing focal osseous lesions. Intact sternotomy wires. Marked thoracic spondylosis. IMPRESSION: 1. 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 New patchy ground-glass opacity and reticulation surrounding the treated nodule, compatible with early postradiation change. 2. New solid left lower lobe pulmonary nodule with average diameter 5 mm, for which follow-up chest CT is advised in 3 months, contralateral metastasis not excluded. Additional scattered pulmonary nodules are all stable. 3. 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 Aortic Atherosclerosis (ICD10-I70.0) and Emphysema (ICD10-J43.9). Electronically Signed   By: JIlona SorrelM.D.   On: 12/30/2017 17:11

## 2017-12-30 NOTE — Patient Instructions (Addendum)
IPF (idiopathic pulmonary fibrosis) (HCC) Chronic respiratory failure with hypoxia (HCC) Therapeutic drug monitoring  - clinically stable - continue esbriet with meals and sunscreen - continue o2 at night and with exertion - in 3 months do Pre-bd spiro and dlco only. No lung volume or bd response. No post-bd spiro   Squamous cell lung cancer, right ([place]) - feb 2019 Right-sided chest wall pain - new  - do CT chest without contrast - will call with results; do anytime next few weeks but preferrably before you see Dr Sondra Come on 01/26/18  Followup - 3 months from 12/30/2017 do Pre-bd spiro and dlco only. No lung volume or bd response. No post-bd spiro - return to see DR Chase Caller in [facility] clnic in 3 months

## 2017-12-31 ENCOUNTER — Ambulatory Visit: Payer: Medicare Other | Admitting: Cardiovascular Disease

## 2017-12-31 NOTE — Telephone Encounter (Signed)
Called and spoke with pt letting him know the results of CT scan.  Pt expressed understanding. Order has been placed for pt to have scan repeated in 6 months.  Nothing further needed.

## 2018-01-07 LAB — PULMONARY FUNCTION TEST
DL/VA % pred: 41 %
DL/VA: 1.92 ml/min/mmHg/L
DLCO COR % PRED: 34 %
DLCO UNC % PRED: 32 %
DLCO UNC: 10.88 ml/min/mmHg
DLCO cor: 11.59 ml/min/mmHg
FEF 25-75 Pre: 2.72 L/sec
FEF2575-%PRED-PRE: 126 %
FEV1-%Pred-Pre: 92 %
FEV1-PRE: 2.83 L
FEV1FVC-%PRED-PRE: 111 %
FEV6-%Pred-Pre: 87 %
FEV6-Pre: 3.48 L
FEV6FVC-%Pred-Pre: 105 %
FVC-%Pred-Pre: 82 %
FVC-PRE: 3.53 L
PRE FEV1/FVC RATIO: 80 %
Pre FEV6/FVC Ratio: 99 %

## 2018-01-09 ENCOUNTER — Telehealth: Payer: Self-pay | Admitting: Cardiovascular Disease

## 2018-01-09 NOTE — Telephone Encounter (Signed)
Patient aware samples are at the front desk for pick up  

## 2018-01-09 NOTE — Telephone Encounter (Signed)
New message   Patient calling the office for samples of medication:   1.  What medication and dosage are you requesting samples for?rivaroxaban (XARELTO) 20 MG TABS tablet  2.  Are you currently out of this medication? Yes

## 2018-01-16 ENCOUNTER — Ambulatory Visit (HOSPITAL_COMMUNITY)
Admission: RE | Admit: 2018-01-16 | Discharge: 2018-01-16 | Disposition: A | Discharge status: Home / Self Care | Payer: Medicare Other | Source: Ambulatory Visit | Attending: Cardiology | Admitting: Cardiology

## 2018-01-16 DIAGNOSIS — I739 Peripheral vascular disease, unspecified: Secondary | ICD-10-CM | POA: Diagnosis present

## 2018-01-16 DIAGNOSIS — I714 Abdominal aortic aneurysm, without rupture, unspecified: Secondary | ICD-10-CM

## 2018-01-22 ENCOUNTER — Other Ambulatory Visit: Payer: Self-pay | Admitting: *Deleted

## 2018-01-22 DIAGNOSIS — I739 Peripheral vascular disease, unspecified: Secondary | ICD-10-CM

## 2018-01-22 DIAGNOSIS — I714 Abdominal aortic aneurysm, without rupture, unspecified: Secondary | ICD-10-CM

## 2018-01-26 ENCOUNTER — Telehealth: Payer: Self-pay | Admitting: Internal Medicine

## 2018-01-26 ENCOUNTER — Other Ambulatory Visit: Payer: Self-pay

## 2018-01-26 ENCOUNTER — Encounter: Payer: Self-pay | Admitting: Radiation Oncology

## 2018-01-26 ENCOUNTER — Ambulatory Visit
Admission: RE | Admit: 2018-01-26 | Discharge: 2018-01-26 | Disposition: A | Discharge status: Home / Self Care | Payer: Medicare Other | Source: Ambulatory Visit | Attending: Radiation Oncology | Admitting: Radiation Oncology

## 2018-01-26 VITALS — BP 107/72 | HR 60 | Temp 98.2°F | Resp 20 | Wt 183.4 lb

## 2018-01-26 DIAGNOSIS — J439 Emphysema, unspecified: Secondary | ICD-10-CM | POA: Diagnosis not present

## 2018-01-26 DIAGNOSIS — I700 Atherosclerosis of aorta: Secondary | ICD-10-CM | POA: Insufficient documentation

## 2018-01-26 DIAGNOSIS — R0602 Shortness of breath: Secondary | ICD-10-CM | POA: Insufficient documentation

## 2018-01-26 DIAGNOSIS — C3431 Malignant neoplasm of lower lobe, right bronchus or lung: Secondary | ICD-10-CM | POA: Diagnosis present

## 2018-01-26 DIAGNOSIS — Z08 Encounter for follow-up examination after completed treatment for malignant neoplasm: Secondary | ICD-10-CM | POA: Insufficient documentation

## 2018-01-26 DIAGNOSIS — Z7982 Long term (current) use of aspirin: Secondary | ICD-10-CM | POA: Diagnosis not present

## 2018-01-26 DIAGNOSIS — Z7901 Long term (current) use of anticoagulants: Secondary | ICD-10-CM | POA: Diagnosis not present

## 2018-01-26 DIAGNOSIS — Z79899 Other long term (current) drug therapy: Secondary | ICD-10-CM | POA: Insufficient documentation

## 2018-01-26 DIAGNOSIS — Z923 Personal history of irradiation: Secondary | ICD-10-CM | POA: Diagnosis not present

## 2018-01-26 NOTE — Telephone Encounter (Signed)
Attempted to call patient today regarding rx Esbriet refill. I did not receive an answer at time of call. Busy signal, tried phone number twice. X1

## 2018-01-26 NOTE — Progress Notes (Signed)
Radiation Oncology         [phone] ________________________________  Name: Troy Miller MRN: [id number]  Date: 01/26/2018  DOB: 1941-04-25  Follow-Up Visit Note  CC: [name], Lucrezia Europe, MD    ICD-10-CM   1. 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   Patient is using oxygen at home just when needed, he typically uses 3 L.  He's going to get another scan in 3 months,  No pain in the chest area, mild coughing, no blood. He's currently working part-time. He has a good appetite, but doesn't eat like he used to.    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 New patchy ground-glass opacity and reticulation surrounding the treated nodule, compatible with early postradiation change. New solid left lower lobe pulmonary nodule with average diameter 5 mm, for which follow-up chest CT is advised in 3 months, contralateral metastasis not excluded. Additional scattered pulmonary nodules are all stable. 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 Aortic Atherosclerosis (ICD10-I70.0) and Emphysema (ICD10-J43.9).   {He reports shortness of breath with activity. He also notes a productive cough with clear phlegm. He associates his coughing with allergies. He reports an adequate appetite.  He denies pain, fatigue, dysphagia, or skin issues. He continues on Xarelto.     Patient underwent  CT angio on 10/20/17. This showed unchanged nodule in the left posterior measuring 6 mm.                ALLERGIES:  is allergic to fentanyl and related.  Meds: Current Outpatient Medications  Medication Sig Dispense Refill  . acetaminophen (TYLENOL) 500 MG tablet Take 1,000 mg by mouth every 6 (six) hours as needed for moderate pain or headache.     Marland Kitchen aspirin 81 MG tablet Take 81 mg by mouth daily.    . calcium carbonate (OS-CAL) 600 MG TABS Take 600 mg by mouth 2 (two) times daily with a meal.    . Cholecalciferol (VITAMIN D) 2000 units CAPS Take 2,000 Units by mouth daily.    . famotidine (PEPCID) 20 MG tablet TAKE 20 MG BY MOUTH EVERYDAY AT BEDTIME 90 tablet 0  . folic acid (FOLVITE) 1 MG tablet Take 1 mg by mouth every evening.     . niacin (NIASPAN) 500 MG CR tablet Take 2,000 mg by mouth every evening.     Marland Kitchen oxyCODONE-acetaminophen (PERCOCET) 10-325 MG tablet Take 1 tablet by mouth 3 (three) times daily as needed for severe pain.    . pantoprazole (PROTONIX) 40 MG tablet TAKE 40 MG BY MOUTH DAILY 30 TO 60 MINUTES BEFORE FIRST MEAL OF THE DAY 90 tablet 0  . Pirfenidone (ESBRIET) 267 MG TABS Take 3 tablets (801 mg total) by mouth 3 (three) times daily. 189 tablet 0  . rivaroxaban (XARELTO) 20 MG TABS tablet Take 1 tablet (20 mg total)  by mouth every morning. 14 tablet 0  . simvastatin (ZOCOR) 20 MG tablet TAKE 1 TABLET BY MOUTH EVERY DAY IN THE EVENING (Patient taking differently: TAKE 20 MG BY MOUTH EVERY DAY IN THE EVENING) 30 tablet 6  . valsartan-hydrochlorothiazide (DIOVAN-HCT) 80-12.5 MG tablet TAKE 1 TABLET BY MOUTH EVERY DAY 30 tablet 6   No current facility-administered medications for this encounter.     Physical Findings: The patient is in no acute distress. Patient is alert and oriented.  weight is 183 lb 6 oz (83.2 kg). His oral temperature is 98.2 F (36.8 C). His blood  pressure is 107/72 and his pulse is 60. His respiration is 20 and oxygen saturation is 93%. .  Lungs are clear to auscultation bilaterally. Heart has regular rate and rhythm. No palpable cervical, supraclavicular, or axillary adenopathy. Abdomen soft, non-tender, normal bowel sounds.     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 History of IPF. EXAM: CT CHEST WITHOUT CONTRAST TECHNIQUE: Multidetector CT imaging of the chest was performed following the standard protocol without IV contrast. COMPARISON:  05/20/2017 high-resolution chest CT. 06/18/2017 PET-CT. FINDINGS: Cardiovascular: Normal heart size. No significant pericardial effusion/thickening. Three-vessel coronary atherosclerosis status post CABG. Atherosclerotic nonaneurysmal thoracic aorta. Stable borderline prominent main pulmonary artery (3.4 cm diameter). Mediastinum/Nodes: No discrete thyroid nodules. Unremarkable esophagus. No pathologically enlarged axillary, mediastinal or hilar lymph nodes, noting limited sensitivity for the detection of hilar adenopathy on this noncontrast study. Lungs/Pleura: No pneumothorax. Stable bilateral calcified pleural plaques and smooth pleural thickening. No pleural effusions. Moderate centrilobular and paraseptal emphysema with diffuse bronchial wall thickening. Right lower lobe solid 1.3 x 1.1 cm pulmonary nodule (series 3/image 103), previously 1.4 x 1.1 cm on 05/20/2017 chest CT, stable. New patchy ground-glass opacity and reticulation in the right lower lobe surrounding the treated nodule, compatible with early postradiation change. New solid 7 x 3 mm (average diameter 5 mm) left lower  lobe pulmonary nodule (series 3/image 94). A few additional scattered solid pulmonary nodules in both lungs measuring up to 9 mm in the apical left upper lobe (series 3/image 25) are all stable. Patchy subpleural reticulation and ground-glass attenuation with associated mild traction bronchiectasis and mild architectural distortion in both lungs, basilar predominant, possibly slightly worsened. Stable minimal dependent honeycombing in lower lobes. Upper abdomen: Cholecystectomy. Musculoskeletal: No aggressive appearing focal osseous lesions. Intact sternotomy wires. Marked thoracic spondylosis. IMPRESSION: 1. 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 New patchy ground-glass opacity and reticulation surrounding the treated nodule, compatible with early postradiation change. 2. New solid left lower lobe pulmonary nodule with average diameter 5 mm, for which follow-up chest CT is advised in 3 months, contralateral metastasis not excluded. Additional scattered pulmonary nodules are all stable. 3. 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 Aortic Atherosclerosis (ICD10-I70.0) and Emphysema (ICD10-J43.9). Electronically Signed   By: Ilona Sorrel M.D.   On: 12/30/2017 17:11    Impression:  Patient is clinically stable four months  out from SBRT. The patient had a recent CT angio for follow-up of aneurysm. His right lower lobe nodule appeared relatively unchanged at this point.   Plan: under go a chest CT scan in late September and follow up in radiation oncology in [facility] October. He will also follow-up with pulmonology after his upcoming chest CT  scan. ____________________________________  -----------------------------------  Blair Promise, PhD, MD   This document serves as a record of services personally performed by Gery Pray, MD. It was created on his behalf by Vanessa Ralphs, a trained medical scribe. The creation of this record is based on the scribe's personal observations and the provider's statements to them. This document has been checked and approved by the attending provider.

## 2018-01-26 NOTE — Progress Notes (Signed)
Mr. Cobbins is here for a follow-up appointment. Patient denies any pain or fatigue. States that he gets shortness of breath with activity. Denies any difficulty with swallowing. States that he coughs up yellowish green phlegm. States that sometime his phlegm is clear. States that his appetite is good. Denies any skin issues. Vitals:   01/26/18 1547  BP: 107/72  Pulse: 60  Resp: 20  Temp: 98.2 F (36.8 C)  TempSrc: Oral  SpO2: 93%  Weight: 183 lb 6 oz (83.2 kg)   Wt Readings from Last 3 Encounters:  01/26/18 183 lb 6 oz (83.2 kg)  12/30/17 185 lb 3.2 oz (84 kg)  11/28/17 190 lb (86.2 kg)

## 2018-01-27 ENCOUNTER — Telehealth: Payer: Self-pay | Admitting: *Deleted

## 2018-01-27 NOTE — Telephone Encounter (Signed)
CALLED PATIENT TO INFORM OF FU FOR 04-13-18 @ 3:30 PM WITH DR. KINARD, SPOKE WITH PATIENT AND HE IS AWARE OF THIS APPT.

## 2018-01-27 NOTE — Telephone Encounter (Signed)
Called patient from my cell phone (blocked number), line rang X2 minutes with no answer.   Pt gets his Esbriet through [facility], and their phone number is [phone].  This is the number he needs to be given when he calls back.

## 2018-01-27 NOTE — Telephone Encounter (Signed)
Pt is returning call. Pt states that our main number will not go through on his phone. Requesting someone call on a cell phone. Cb is [phone].

## 2018-01-28 ENCOUNTER — Telehealth: Payer: Self-pay | Admitting: Internal Medicine

## 2018-01-28 NOTE — Telephone Encounter (Signed)
Tried to call patient twice this morning, no answer, busy signal.  Mailed letter in mail for patient to contact the office as we tried to call him several times Nothing further needed

## 2018-01-28 NOTE — Telephone Encounter (Signed)
Attempted to call patient today regarding rx for esbriet. I did not receive an answer at time of call. Busy signal on phone, will try again later

## 2018-01-29 NOTE — Telephone Encounter (Signed)
Spoke with pt.  He is almost out of his Esbriet and hs lost the number to reorder.  PT states he no longer gets it from [place].  Gave pt the number for the [place] [phone]) and also [place] [phone])  PT will call them and let us know if anything further is needed.

## 2018-02-06 ENCOUNTER — Telehealth: Payer: Self-pay | Admitting: Internal Medicine

## 2018-02-06 NOTE — Telephone Encounter (Signed)
Called patient unable to reach left message to give [place] a call back.

## 2018-02-09 NOTE — Telephone Encounter (Signed)
Tried to call patient unable to reach him, busy signal, no VM option to call back. X2

## 2018-02-10 NOTE — Telephone Encounter (Signed)
Called spoke with patient who reported that he called because he received a letter from this office. Per patient's chart, a letter was mailed to patient on 01/26/18 because we were unable to reach him. Patient stated that he got the issue with his Esbriet resolved and nothing further is needed. Will sign off.

## 2018-02-20 ENCOUNTER — Other Ambulatory Visit: Payer: Self-pay | Admitting: Cardiovascular Disease

## 2018-02-24 ENCOUNTER — Telehealth: Payer: Self-pay | Admitting: Cardiovascular Disease

## 2018-02-24 NOTE — Telephone Encounter (Signed)
New Message ° ° °Patient calling the office for samples of medication: ° ° °1.  What medication and dosage are you requesting samples for? rivaroxaban (XARELTO) 20 MG TABS tablet ° °2.  Are you currently out of this medication? Yes ° ° °

## 2018-02-24 NOTE — Telephone Encounter (Signed)
Spoke with pt. Adv pt that samples of Xarelto 35m daily will be left at the front desk for pick up.

## 2018-02-25 ENCOUNTER — Other Ambulatory Visit: Payer: Self-pay

## 2018-02-25 DIAGNOSIS — I714 Abdominal aortic aneurysm, without rupture, unspecified: Secondary | ICD-10-CM

## 2018-02-25 DIAGNOSIS — I739 Peripheral vascular disease, unspecified: Secondary | ICD-10-CM

## 2018-03-06 ENCOUNTER — Other Ambulatory Visit: Payer: Self-pay | Admitting: Internal Medicine

## 2018-03-19 ENCOUNTER — Telehealth: Payer: Self-pay | Admitting: Cardiovascular Disease

## 2018-03-19 NOTE — Telephone Encounter (Signed)
Left message for call back... 14 samples left at the front desk for him.

## 2018-03-19 NOTE — Telephone Encounter (Signed)
New Message:      Patient calling the office for samples of medication:   1.  What medication and dosage are you requesting samples for? Xarelto  2.  Are you currently out of this medication? yes

## 2018-03-31 ENCOUNTER — Ambulatory Visit (INDEPENDENT_AMBULATORY_CARE_PROVIDER_SITE_OTHER): Payer: Medicare Other | Admitting: Internal Medicine

## 2018-03-31 ENCOUNTER — Encounter: Payer: Self-pay | Admitting: Internal Medicine

## 2018-03-31 ENCOUNTER — Other Ambulatory Visit: Payer: Self-pay | Admitting: Cardiovascular Disease

## 2018-03-31 ENCOUNTER — Other Ambulatory Visit (INDEPENDENT_AMBULATORY_CARE_PROVIDER_SITE_OTHER): Payer: Medicare Other

## 2018-03-31 ENCOUNTER — Telehealth: Payer: Self-pay | Admitting: Internal Medicine

## 2018-03-31 VITALS — BP 128/70 | HR 76 | Ht 71.0 in | Wt 187.0 lb

## 2018-03-31 DIAGNOSIS — Z5181 Encounter for therapeutic drug level monitoring: Secondary | ICD-10-CM

## 2018-03-31 DIAGNOSIS — L299 Pruritus, unspecified: Secondary | ICD-10-CM | POA: Diagnosis not present

## 2018-03-31 DIAGNOSIS — J84112 Idiopathic pulmonary fibrosis: Secondary | ICD-10-CM

## 2018-03-31 DIAGNOSIS — R911 Solitary pulmonary nodule: Secondary | ICD-10-CM

## 2018-03-31 DIAGNOSIS — E785 Hyperlipidemia, unspecified: Secondary | ICD-10-CM

## 2018-03-31 DIAGNOSIS — I2510 Atherosclerotic heart disease of native coronary artery without angina pectoris: Secondary | ICD-10-CM | POA: Diagnosis not present

## 2018-03-31 LAB — PULMONARY FUNCTION TEST
DL/VA % PRED: 41 %
DL/VA: 1.91 ml/min/mmHg/L
DLCO UNC: 10.69 ml/min/mmHg
DLCO unc % pred: 31 %
FEF 25-75 Pre: 3.13 L/sec
FEF2575-%PRED-PRE: 148 %
FEV1-%Pred-Pre: 94 %
FEV1-Pre: 2.85 L
FEV1FVC-%PRED-PRE: 118 %
FEV6-%PRED-PRE: 84 %
FEV6-PRE: 3.35 L
FEV6FVC-%Pred-Pre: 106 %
FVC-%Pred-Pre: 79 %
FVC-PRE: 3.35 L
PRE FEV6/FVC RATIO: 100 %
Pre FEV1/FVC ratio: 85 %

## 2018-03-31 LAB — HEPATIC FUNCTION PANEL
ALBUMIN: 4.4 g/dL (ref 3.5–5.2)
ALK PHOS: 61 U/L (ref 39–117)
ALT: 7 U/L (ref 0–53)
AST: 13 U/L (ref 0–37)
Bilirubin, Direct: 0.1 mg/dL (ref 0.0–0.3)
Total Bilirubin: 0.5 mg/dL (ref 0.2–1.2)
Total Protein: 7.7 g/dL (ref 6.0–8.3)

## 2018-03-31 NOTE — Telephone Encounter (Signed)
Called patient, notified that samples were up front   Medication given: Xarelto 70m Lot #: [id number]: 6/21 Qty: 3 bottles

## 2018-03-31 NOTE — Progress Notes (Signed)
Pt completed Pre Arlyce Harman and DLCO today.

## 2018-03-31 NOTE — Patient Instructions (Addendum)
IPF (idiopathic pulmonary fibrosis) ([place])  - slightly worse since last visit v stable - continue esbriet 3 times daily with meals - in 6 months do spirometry/dlco - repeat HRCT in 6 months - flu shot deferred 03/31/2018  Therapeutic drug monitoring Itching  - new issue  - itching could be dry skin - eat flaxseed daily - apply vaseline twice daily to body  - check LFT 03/31/2018   Solitary pulmonary nodule 29m left lower lobe - June 2019 - repeat HRCT Feb/March 2020  Hyperlipidemia, unspecified hyperlipidemia type  - please talk to Dr TEllouise Newerto see if you still need niaspan  Followup  6 months ILD clinic but after CT and PFT

## 2018-03-31 NOTE — Telephone Encounter (Signed)
I will be seeing him next week in the office.  In the past he has had significant mixed hyperlipidemia with elevation of triglycerides.  His most recent laboratory had improved.  We can try to discontinue niacin, continue statin and if triglycerides continue to increase he may be a candidate for vascepa.

## 2018-03-31 NOTE — Telephone Encounter (Signed)
Patient calling the office for samples of medication: ° ° °1.  What medication and dosage are you requesting samples for? Xarelto ° °2.  Are you currently out of this medication?  1 left ° ° ° °

## 2018-03-31 NOTE — Progress Notes (Addendum)
OV 05/12/2017  Chief Complaint  Patient presents with  . Follow-up    Pt states that he is doing good. C/o SOB with exertion, occ. cough with clear to white mucus. Denies any CP.   Follow-up idiopathic pulmonary fibrosis started on Pirfenidone (Esbriet) and June 2018.  Follow left lower lobe lung nodule 8 mm in February 2018   He is here for routine follow-up. Last visit he did see my colleague and noticed some increased shortness of breath but currently this is settled. He is on full dose Pirfenidone (Esbriet) and is tolerating it well without any problems. He is interested in enrolling into the 1 pill format. He denies any worsening shortness of breath or cough that he does have shortness of breath with exertion relieved by rest but this is stable. His most recent liver function test was in September 2018 and normal. His creatinine was slightly elevated at 1.2 mg percent at that time. He is interested in research protocols in the future. He lives one hour away and it would be easier for him to have liver function tests monitoring locally he's not had a follow-up CT scan of the chest for his left lower lobe nodule noted in August 2080 needed have a CT angiogram that showed his lung nodule is stable compared to February 2018.    OV 08/01/2017  Chief Complaint  Patient presents with  . Follow-up    HRCT done 11/13, PET scan done 12/12, and PFT done today. Pt is currently on Esbriet. Pt states he has been doing about the same as last visit.  Occ. cough, SOB with exertion. Denies any CP.     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 Followup RLL lung nodule  In terms of his IPF: He is doing well clinically.  He says he is stable.  However when we walked him he desaturated on the second lap.  In fact pulmonary function test shows a decline.  But he is not feeling it.  He does have portable oxygen with him at home and he does not use  it.  Liver function tests in January 2019 on the seventh was normal.  He did this outside with his primary care physician and he brought the results with me for review.  At this point in time he finished 6 months of liver function test.  After this he will have liver function test every 3 months  Lung nodule: His CT scan recently showed right lower lobe lung nodule.  He had a follow-up PET scan and this shows the lung nodule to be PET hot.  I visualized the scan with Dr. Baltazar Apo and it is too far for bronchoscopy means.  The pretest probability for this being early stage non-small cell lung cancer is extremely high over 95% in my view.    Walking desaturation test on 08/01/2017 185 feet x 3 laps on ROOM AIR:  did YES desaturate. Rest pulse ox was 98%, final pulse ox was 85 at% at 2nd lap and stopped and started on 2L [place] an stayed at 96% for remainder. HR response 50/min at rest to 90 /min at peak exertion at 2nd lap when desaturated. Patient Troy Miller  Yes did Desaturate < 88% . Conn A Abt yes  Desaturated </= 3% points. Rayder A Hortman yes did get tachyardic    OV 11/28/2017    Chief Complaint  Patient presents with  . Acute Visit  Pt has c/o SOB and cough with yellow mucus. Pt started on pred taper and abx 5/22.Pt states he hs had some indigestion after taking the doxy. Pt states since started on doxy and pred, SOB has improved. Pt is also still coughing but states mucus is turning clear now.   Troy Miller  presents for follow-up of his idiopathic pulmonary fibrosis and therapeutic monitoring.  He is on Esbriet.  As of January 2019 he was having slow decline in lung function.  But he was tolerating the Pirfenidone (Esbriet) just fine.  At that time he had a PET heart right lower lobe nodule and I referred him to oncology.  Review of the notes indicate that he had 3 settings of radiation therapy and he was doing well after that.  He has a follow-up pending with  radiation oncology.  On Nov 26, 2017 he called our office with acute bronchitis exacerbation and we gave him doxycycline and prednisone.  He is midway through it and is beginning to feel a lot better and is close to baseline.  He did have some nausea and stomach irritation with the doxycycline.  Review of the medication list shows that he is on Niaspan for his hyperlipidemia and also Pirfenidone (Esbriet) for his IPF both of which are prone for GI side effects although at baseline he is never had any problems with those.  At this point in time he is overall feeling good.  He uses oxygen at night and periodically on a subjective basis with exertion   OV 12/30/2017  Chief Complaint  Patient presents with  . Follow-up    Pt states he is about the same as he was at last visit. Pt still becomes SOB, has cough with mostly clear mucus, and sometimes has occ. CP/chest tightness.   Troy Miller , 77 y.o. , with dob 09-Feb-1941 and male ,Not Hispanic or Latino from [place] [address] - presents to ILD  clinic for IPF followup on Esbreit  In terms of IPF: He is overall stable. He tells me that he takes his aspirin regularly. Does not much of nausea. He is tolerating it fine. He apply sunscreen. Uses oxygen at night. He uses oxygen only at day time with exertion but on a subjective basis even though he desaturated quickly at the end of second lap in the office is documented below which is similar to before. His last liver function was May 2019 in stable. I personally review that result with a normal CK of 55 and AST are 16 and ALT of 12.  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 Radiation was in spring 2019. He is now having for the last several weeks to a few months new onset right lower lobe chest pain that is musculoskeletal. It happens randomly in the daytime particularly when he moves but he is not associating it with a  body movement is no radiation. The symptoms are mild to moderate in intensity. Is no clear cut aggravating or relieving factors. It is not present at night when he sleeping. This no weight loss or hemoptysis. Last imaging was in January 2019 at the time of diagnosis of cancer. His follow-up with radiation oncology on review of the chartshows its on 01/26/2018.     OV 03/31/2018  Subjective:  Patient ID: Troy Miller, male , DOB: 12-04-1940 , age 77 y.o. , MRN: [id number] , ADDRESS: [address] [place] [address]   03/31/2018 -   Chief Complaint  Patient presents with  . Follow-up    PFT performed today. Pt still becomes SOB when he exerts himself, has occ cough with clear to yellow mucus. Denies any CP.     - Follow-up idiopathic pulmonary fibrosis started on Pirfenidone (Esbriet)  - sinceeMay/June 2018.  - [place] RLL lung cancer - s/p XRT Feb 2019 - CT June 2019 - withj 77m LLL new nodule  HPI Troy Miller 77y.o. -follows up IPF.  He is on Esbriet and is tolerating this well.  Although for the last several months he tells me that he had nonspecific itching that is moderate in severity.  He is wondering if it is because of Pirfenidone (Esbriet) but he is on many medications.  He does have dry skin.  He does not use Vaseline.  He continues to be on Niaspan for hyperlipidemia along with statin.  He has an upcoming cardiology appointment.  In terms of his IPF he feels stable.  His walking desaturation test is stable.  His lung function shows a mild decline but then looking at the flow volume loop it appears he has coughed significantly.  He does use oxygen as needed with exertion but is not fully regular with it.  He does not have any other GI side effects or sound side effects of Pirfenidone (Esbriet).  He does apply sunscreen regularly.  There is no rash anywhere.  New issue: He had a CT scan June 2019 and during post radiation surveillance a 5 mm left lower lobe nodule was  discovered this is new.   Results for HLEOMAR, WESTBERG(MRN [id number] as of 03/31/2018 14:32  Ref. Range 04/14/2015 11:47 05/03/2016 12:52 12/09/2016 15:22 08/01/2017 10:09 - XRT lung feb 2019 03/31/2018 13:54  FVC-Pre Latest Units: L 3.96 3.70 3.84 3.53 3.35  FVC-%Pred-Pre Latest Units: % 90 85 89 82 79  Results for HISSIAC, JAMAR(MRN [id number] as of 03/31/2018 14:32  Ref. Range 04/14/2015 11:47 05/03/2016 12:52 12/09/2016 15:22 08/01/2017 10:09 03/31/2018 13:54  DLCO unc Latest Units: ml/min/mmHg 13.79 12.45 11.70 10.88 10.69  DLCO unc % pred Latest Units: % 40 36 34 32 31     Simple office walk 185 feet x  3 laps goal with forehead probe 11/28/2017  12/30/2017  03/31/2018   O2 used Room air Room air Room air  Number laps completed 1 2 - stopeed at end due to desat 2 - stopped at end of 2 lapst due to desat  Comments about pace Normal pace slow Normal pace  Resting Pulse Ox/HR 93% and 63/min 96%/61/min     97% and 76/min  Final Pulse Ox/HR 84% and 91/min 87% and 97/min 86% and 91/min  Desaturated </= 88% yes yes ues  Desaturated <= 3% points yes yes ues  Got Tachycardic >/= 90/min yes yes yes  Symptoms at end of test x x Mild dyspnea  Miscellaneous comments desatruiated in 1 lap, corrected with 2L [place] at 1 lap desat after 2 laps - same ass before Similar to before     ROS - per HPI     has a past medical history of AAA (abdominal aortic aneurysm) ([facility], Aortic stenosis, mild, Atrial fibrillation (HCC), Atrial flutter ([facility] [place], CAD (coronary artery disease), DVT (deep venous thrombosis) ([facility], History of stress test, Hyperlipemia, Hypertension, Iliac artery aneurysm ([facility], PVD (peripheral vascular disease) ([facility], and 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  reports that he quit smoking about 31 years ago. His smoking use included cigarettes. He has a 50.00 pack-year smoking history. He has never used smokeless tobacco.  Past Surgical History:  Procedure Laterality  Date  . ABDOMINAL AORTOGRAM W/LOWER EXTREMITY N/A 02/18/2017   Procedure: ABDOMINAL AORTOGRAM W/LOWER EXTREMITY;  Surgeon: Serafina Mitchell, MD;  Location: [facility];  Service: Cardiovascular;  Laterality: N/A;  . BACK SURGERY  2003  . CARDIAC ELECTROPHYSIOLOGY STUDY AND ABLATION  03/19/10  . CARDIOVERSION  08/16/08  . CHOLECYSTECTOMY  08/13/02  . CORONARY ARTERY BYPASS GRAFT  1994 & 2005  . FEMORAL-TIBIAL BYPASS GRAFT     Remote  . HERNIA REPAIR  06/03/07  . PERIPHERAL VASCULAR INTERVENTION Left 02/18/2017   Procedure: PERIPHERAL VASCULAR INTERVENTION;  Surgeon: Serafina Mitchell, MD;  Location: [facility];  Service: Cardiovascular;  Laterality: Left;  POPLITEAL  . VASECTOMY  01/28/08    Allergies  Allergen Reactions  . Fentanyl And Related Other (See Comments)    Hallucinations    Immunization History  Administered Date(s) Administered  . Influenza Split 04/20/2014  . Influenza, High Dose Seasonal PF 05/17/2016, 05/12/2017  . Influenza,inj,Quad PF,6+ Mos 03/10/2015  . Influenza-Unspecified 06/02/2012  . Pneumococcal Conjugate-13 02/23/2016    Family History  Problem Relation Age of Onset  . Lung cancer Mother        smoked  . Heart failure Father   . Stroke Sister      Current Outpatient Medications:  .  acetaminophen (TYLENOL) 500 MG tablet, Take 1,000 mg by mouth every 6 (six) hours as needed for moderate pain or headache. , Disp: , Rfl:  .  aspirin 81 MG tablet, Take 81 mg by mouth daily., Disp: , Rfl:  .  calcium carbonate (OS-CAL) 600 MG TABS, Take 600 mg by mouth 2 (two) times daily with a meal., Disp: , Rfl:  .  Cholecalciferol (VITAMIN D) 2000 units CAPS, Take 2,000 Units by mouth daily., Disp: , Rfl:  .  famotidine (PEPCID) 20 MG tablet, TAKE 1 TABLET BY MOUTH AT BEDTIME, Disp: 90 tablet, Rfl: 1 .  folic acid (FOLVITE) 1 MG tablet, Take 1 mg by mouth every evening. , Disp: , Rfl:  .  niacin (NIASPAN) 500 MG CR tablet, Take 2,000 mg by mouth every  evening. , Disp: , Rfl:  .  oxyCODONE-acetaminophen (PERCOCET) 10-325 MG tablet, Take 1 tablet by mouth 3 (three) times daily as needed for severe pain., Disp: , Rfl:  .  pantoprazole (PROTONIX) 40 MG tablet, TAKE 1 TABLET BY MOUTH ONCE DAILY 30 TO 60 MINUTES BEFORE FIRST MEAL OF THE DAY, Disp: 90 tablet, Rfl: 1 .  Pirfenidone (ESBRIET) 267 MG TABS, Take 3 tablets (801 mg total) by mouth 3 (three) times daily., Disp: 189 tablet, Rfl: 0 .  rivaroxaban (XARELTO) 20 MG TABS tablet, Take 1 tablet (20 mg total) by mouth every morning., Disp: 14 tablet, Rfl: 0 .  simvastatin (ZOCOR) 20 MG tablet, TAKE 1 TABLET BY MOUTH EVERY DAY IN THE EVENING, Disp: 90 tablet, Rfl: 1 .  valsartan-hydrochlorothiazide (DIOVAN-HCT) 80-12.5 MG tablet, TAKE 1 TABLET BY MOUTH EVERY DAY, Disp: 90 tablet, Rfl: 1      Objective:   Vitals:   03/31/18 1414  BP: 128/70  Pulse: 76  SpO2: 97%  Weight: 187 lb (84.8 kg)  Height: _0  (1.803 m)    Estimated body mass index is 26.08 kg/m as calculated from the following:   Height as of  this encounter: _0  (1.803 m).   Weight as of this encounter: 187 lb (84.8 kg).  _1 @  [employer]   03/31/18 1414  Weight: 187 lb (84.8 kg)     Physical Exam  General Appearance:    Alert, cooperative, no distress, appears stated age - yes , sitting on - chair , Deconditioned looking - no  Head:    Normocephalic, without obvious abnormality, atraumatic  Eyes:    PERRL, conjunctiva/corneas clear,  Ears:    Normal TM's and external ear canals, both ears  Nose:   Nares normal, septum midline, mucosa normal, no drainage    or sinus tenderness. OXYGEN ON  - no . Patient is @ ra   Throat:   Lips, mucosa, and tongue normal; teeth and gums normal. Cyanosis on lips - no  Neck:   Supple, symmetrical, trachea midline, no adenopathy;    thyroid:  no enlargement/tenderness/nodules; no carotid   bruit or JVD  Back:     Symmetric, no curvature, ROM normal, no CVA tenderness    Lungs:     Distress - no , Wheeze no, Barrell Chest - no, Purse lip breathing - no, Crackles - yes at lung base   Chest Wall:    No tenderness or deformity. Scars in chest no   Heart:    Regular rate and rhythm, S1 and S2 normal, no rub   or gallop, Murmur - no  Breast Exam:    NOT DONE  Abdomen:     Soft, non-tender, bowel sounds active all four quadrants,    no masses, no organomegaly. Visceral obesity - no  Genitalia:   NOT DONE  Rectal:   NOT DONE  Extremities:   Extremities normal, atraumatic, Clubbing - YES, Edema - no  Pulses:   2+ and symmetric all extremities  Skin:   Stigmata of Connective Tissue Disease - no  Lymph nodes:   Cervical, supraclavicular, and axillary nodes normal  Psychiatric:  Neurologic:    - pleasant   CAm-ICU - neg, Alert and Oriented x 3 - yes, Moves all 4s - yes, Speech - normal, Cognition - intact           Assessment:       ICD-10-CM   1. IPF (idiopathic pulmonary fibrosis) (HCC) J84.112 CT Chest High Resolution    Pulmonary function test  2. Therapeutic drug monitoring Z51.81   3. Itching L29.9   4. Solitary pulmonary nodule R91.1   5. Hyperlipidemia, unspecified hyperlipidemia type E78.5        Plan:     Patient Instructions  IPF (idiopathic pulmonary fibrosis) ([place])  - slightly worse since last visit v stable - continue esbriet 3 times daily with meals - in 6 months do spirometry/dlco - repeat HRCT in 6 months - flu shot deferred 03/31/2018  Therapeutic drug monitoring Itching  - new issue  - itching could be dry skin - eat flaxseed daily - apply vaseline twice daily to body  - check LFT 03/31/2018   Solitary pulmonary nodule 73m left lower lobe - June 2019 - repeat HRCT Feb/March 2020  Hyperlipidemia, unspecified hyperlipidemia type  - please talk to Dr TEllouise Newerto see if you still need niaspan  Followup  6 months ILD clinic but after CT and PFT     SIGNATURE    Dr. [name] M.D., F.C.C.P,  Pulmonary  and Critical Care Medicine Staff Physician, CBanksDirector - Interstitial Lung Disease  Program  Pulmonary  [place] at [place], [place], [address]  Pager: [phone], If no answer or between  15:00h - 7:00h: call [phone] Telephone: [phone]  2:56 PM 03/31/2018

## 2018-03-31 NOTE — Telephone Encounter (Signed)
Hi Troy Miller  You are seeing Troy Miller in a week or so. Noticed he is in niaspan and statin. Wondering if he still needs niaspan? He is also on IPF drug esbreit that can increase his LFT  THanks    SIGNATURE    Dr. Brand Males, M.D., F.C.C.P,  Pulmonary and Critical Care Medicine Staff Physician, [facility] Director - Interstitial Lung Disease  Program  Pulmonary [place] at [place], [place], [address]  Pager: [phone], If no answer or between  15:00h - 7:00h: call [phone] Telephone: [phone]  2:55 PM 03/31/2018

## 2018-03-31 NOTE — Addendum Note (Signed)
Addended by: Jannette Spanner on: 03/31/2018 02:56 PM   Modules accepted: Orders

## 2018-04-01 NOTE — Telephone Encounter (Signed)
Ok thanks. Will leave decision to you. Just wanted to bring to your attention that is all

## 2018-04-06 ENCOUNTER — Ambulatory Visit (INDEPENDENT_AMBULATORY_CARE_PROVIDER_SITE_OTHER): Payer: Medicare Other | Admitting: Cardiovascular Disease

## 2018-04-06 ENCOUNTER — Encounter: Payer: Self-pay | Admitting: Cardiovascular Disease

## 2018-04-06 VITALS — BP 104/60 | HR 72 | Ht 71.0 in | Wt 185.6 lb

## 2018-04-06 DIAGNOSIS — I350 Nonrheumatic aortic (valve) stenosis: Secondary | ICD-10-CM

## 2018-04-06 DIAGNOSIS — J84112 Idiopathic pulmonary fibrosis: Secondary | ICD-10-CM

## 2018-04-06 DIAGNOSIS — G4733 Obstructive sleep apnea (adult) (pediatric): Secondary | ICD-10-CM

## 2018-04-06 DIAGNOSIS — I739 Peripheral vascular disease, unspecified: Secondary | ICD-10-CM

## 2018-04-06 DIAGNOSIS — I2510 Atherosclerotic heart disease of native coronary artery without angina pectoris: Secondary | ICD-10-CM

## 2018-04-06 DIAGNOSIS — J61 Pneumoconiosis due to asbestos and other mineral fibers: Secondary | ICD-10-CM

## 2018-04-06 DIAGNOSIS — E782 Mixed hyperlipidemia: Secondary | ICD-10-CM | POA: Diagnosis not present

## 2018-04-06 DIAGNOSIS — Z951 Presence of aortocoronary bypass graft: Secondary | ICD-10-CM

## 2018-04-06 DIAGNOSIS — I10 Essential (primary) hypertension: Secondary | ICD-10-CM

## 2018-04-06 DIAGNOSIS — Z79899 Other long term (current) drug therapy: Secondary | ICD-10-CM

## 2018-04-06 NOTE — Progress Notes (Signed)
Patient ID: Troy Miller, male   DOB: [birth year], 77 y.o.   MRN: [id number]     HPI: Troy Miller is a 77 y.o. male who presents for a 36  month cardiology and sleep evaluation.   Troy Miller has a history of CAD and in 1994 underwent initial CABG revascularization surgery. In May 2005 he underwent redo surgery with a RIMA to the LAD, vein graft to distal RCA by [name]. His previous LIMA graft was preserved and supplied the OM1 vessel. He has history of atrial flutter status post ablation by [name] as well as a history of PAF. He has documented peripheral vascular disease with abdominal aortic aneurysm, as well as common iliac artery aneurysms. He status post right fem-tib bypass graft surgery. Additional problems include hypertension as well as mixed hyperlipidemia. He also has mild aortic stenosis and documentation of an infrarenal abdominal aortic aneurysm.   A followup echo Doppler study on 05/11/2013 showed an ejection fraction in the 45-50% range with mild inferior hypokinesis.  There was grade 1 diastolic dysfunction.  His aortic valve was moderately calcified and there was evidence for mild aortic valve stenosis with a peak gradient of 19 and mean gradient of 10. Aortic valve area was 1.5 1.64 cm.  There was mild aortic root dilatation at 42 mm mild dilatation of the ascending aorta..  There was mitral regurgitation.  A follow-up abdominal aortic Doppler study on April 29, 2014 demonstrated an infrarenal fusiform aneurysm at 3.63.7 with mild amount of atherosclerosis visualized throughout.  The right and left proximal common iliac arteries also demonstrated aneurysmal dilatation.  The right common iliac artery measured 2.3.  A 2.3 without evidence for stenosis.  Left common iliac artery measured 3.2 x 3.0 with mild amount of thrombus and low flow velocity.  He underwent a follow-up ultrasound last week which showed slight progression.  His aneurysm in the distal aorta, now  measured 4.03.7.  There was a new finding of a right common iliac artery aneurysm measuring 2.2 by 2.3 cm.  The left common iliac artery was stable at 3.2 x 3 point 0 cm.  The IVC veins were patent.  A follow-up evaluation was recommended in one year.  A follow-up echo Doppler study on 11/04/2014 demonstrated an EF 50-55% without regional wall motion abnormalities.  There was evidence for mild aortic valve stenosis valve area of 1. 4 9 and 1.43 by measurements. Mean gradient was 14 and peak gradient 32 mmHg.  He saw [name] with complaints of chest discomfort in 2016.  A nuclear perfusion study was  on 02/10/2015:  Ejection fraction was 47%. There was evidence for prior inferior perfusion defect suggesting prior infarction.  There was no ischemia.   Presently, Troy Miller denies chest pain.  He recently underwent several treatment of skin lesions on his face by his dermatologist and will need additional treatment for 1.  On his nose which he states was positive for mild malignancy. He denies anginal symptoms.  He denies PND, orthopnea.  He denies bleeding.   Troy Miller has a history of obstructive sleep apnea dating back to 2005.  His initial sleep study showed severe sleep apnea with an AHI of 33.9, and in addition to obstructive events had central events.  Initial O2 desaturation was 81%.  Remotely he had seen Dr. [name], he had undergone a trial of BiPAP, but reportedly had felt better with CPAP therapy.  He has not used CPAP therapy for at least 3-4  years.  His DME company had been [employer] which is no longer in business.  He admits to daytime sleepiness.  He does snore.  He denies any recent CHF symptomatology.  When I last saw him, I deferred him for a new sleep study.  This was done on 12/22/2015.  This confirmed moderate sleep apnea with an AHI overall of 26 per hour.  However, sleep apnea was severe during [name] sleep with an AHI 51.4.  He had significant oxygen desaturation to 82% in time  below 88% was 76.4 minutes.  There was moderate snoring.  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  He received  a new CPAP machine.  He admits to 100% compliance.  His sleep has improved.  He denies daytime sleepiness and is unaware of breakthrough snoring.  I last saw him in September 2018 was also being seen by the [place].Marland Kitchen  Remotely he developed cough secondary to ace inhibition.  He is seeing Dr. 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  Occasionally he require supplemental oxygen if he is walking but typically does not use oxygen routinely.    Since I last saw him, echo Doppler study from December 26, 2017 showed an EF of 35 to 40% with to mid inferolateral inferior inferoseptal mild hypokinesis.  There was grade 1 diastolic dysfunction.  There was mild aortic stenosis with a mean gradient of 21 and peak gradient of 31.  The ascending aorta was felt to be mildly dilated.  His RV was mildly dilated.  He had mild increased PA pressure at 34 mmHg. He underwent abdominal aortic Doppler to assess his abdominal aortic aneurysm on January 16, 2018  which showed the largest diameter 3.7 cm, essentially unchanged from prior exams.  Lower extremity duplex showed normal staying ABIs at his right toe brachial index was abnormal as well as his left toe brachial index.  He is being evaluated by Dr. Onnie Graham for intersitital pulmonary fibrosis and was started on Esbriet.  His LFTs have been normal.  He has a history of significant mixed hyperlipidemia and for this reason had been on combination niacin with statin.  ALT on March 31, 2018 was normal at 7 and AST was 13.  He previously had been on omega-3 fatty acids but apparently these were discontinued by Dr. Melvyn Novas according to the patient because of his asbestosis.  He had not had any recent lipid studies.  He presents for evaluation.   Past Medical History:    Diagnosis Date  . AAA (abdominal aortic aneurysm) ([place])   . Aortic stenosis, mild    07/10/12 EF 50-55% on echo-mitral annular ca+ also  . Atrial fibrillation ([place])    03/19/10 ablation-[name]  . Atrial flutter ([place])   . CAD (coronary artery disease)   . DVT (deep venous thrombosis) ([place])   . History of stress test    Myoview 8/16:  EF 47%, inferior scar, no ischemia, Intermediate risk (low EF)  . Hyperlipemia   . Hypertension   . Iliac artery aneurysm ([place])   . PVD (peripheral vascular disease) ([place])    remote right fem-tib bypass graft sugery  . 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 ABDOMINAL AORTOGRAM W/LOWER EXTREMITY N/A 02/18/2017   Procedure: ABDOMINAL AORTOGRAM W/LOWER EXTREMITY;  Surgeon: Harold Barban  W, MD;  Location: [facility];  Service: Cardiovascular;  Laterality: N/A;  . BACK SURGERY  2003  . CARDIAC ELECTROPHYSIOLOGY STUDY AND ABLATION  03/19/10  . CARDIOVERSION  08/16/08  . CHOLECYSTECTOMY  08/13/02  . CORONARY ARTERY BYPASS GRAFT  1994 & 2005  . FEMORAL-TIBIAL BYPASS GRAFT     Remote  . HERNIA REPAIR  06/03/07  . PERIPHERAL VASCULAR INTERVENTION Left 02/18/2017   Procedure: PERIPHERAL VASCULAR INTERVENTION;  Surgeon: Serafina Mitchell, MD;  Location: [facility];  Service: Cardiovascular;  Laterality: Left;  POPLITEAL  . VASECTOMY  01/28/08    Allergies  Allergen Reactions  . Fentanyl And Related Other (See Comments)    Hallucinations    Current Outpatient Medications  Medication Sig Dispense Refill  . acetaminophen (TYLENOL) 500 MG tablet Take 1,000 mg by mouth every 6 (six) hours as needed for moderate pain or headache.     Marland Kitchen aspirin 81 MG tablet Take 81 mg by mouth daily.    . calcium carbonate (OS-CAL) 600 MG TABS Take 600 mg by mouth 2 (two) times daily with a meal.    . Cholecalciferol (VITAMIN D) 2000 units CAPS Take 2,000 Units by mouth daily.    .  famotidine (PEPCID) 20 MG tablet TAKE 1 TABLET BY MOUTH AT BEDTIME 90 tablet 1  . folic acid (FOLVITE) 1 MG tablet Take 1 mg by mouth every evening.     . niacin (NIASPAN) 500 MG CR tablet Take 2,000 mg by mouth every evening.     Marland Kitchen oxyCODONE-acetaminophen (PERCOCET) 10-325 MG tablet Take 1 tablet by mouth 3 (three) times daily as needed for severe pain.    . pantoprazole (PROTONIX) 40 MG tablet TAKE 1 TABLET BY MOUTH ONCE DAILY 30 TO 60 MINUTES BEFORE FIRST MEAL OF THE DAY 90 tablet 1  . Pirfenidone (ESBRIET) 267 MG TABS Take 3 tablets (801 mg total) by mouth 3 (three) times daily. 189 tablet 0  . rivaroxaban (XARELTO) 20 MG TABS tablet Take 1 tablet (20 mg total) by mouth every morning. 14 tablet 0  . simvastatin (ZOCOR) 20 MG tablet TAKE 1 TABLET BY MOUTH EVERY DAY IN THE EVENING 90 tablet 1  . valsartan-hydrochlorothiazide (DIOVAN-HCT) 80-12.5 MG tablet TAKE 1 TABLET BY MOUTH EVERY DAY 90 tablet 1   No current facility-administered medications for this visit.     Social History   Socioeconomic History  . Marital status: Single    Spouse name: Not on file  . Number of children: Not on file  . Years of education: Not on file  . Highest education level: Not on file  Occupational History  . Not on file  Social Needs  . Financial resource strain: Not on file  . Food insecurity:    Worry: Not on file    Inability: Not on file  . Transportation needs:    Medical: Not on file    Non-medical: Not on file  Tobacco Use  . Smoking status: Former Smoker    Packs/day: 2.00    Years: 25.00    Pack years: 50.00    Types: Cigarettes    Last attempt to quit: 04/26/1986    Years since quitting: 31.9  . Smokeless tobacco: Never Used  Substance and Sexual Activity  . Alcohol use: No    Alcohol/week: 0.0 standard drinks  . Drug use: No  . Sexual activity: Not on file  Lifestyle  . Physical activity:    Days per week: Not on  file    Minutes per session: Not on file  . Stress: Not on  file  Relationships  . Social connections:    Talks on phone: Not on file    Gets together: Not on file    Attends religious service: Not on file    Active member of club or organization: Not on file    Attends meetings of clubs or organizations: Not on file    Relationship status: Not on file  . Intimate partner violence:    Fear of current or ex partner: Not on file    Emotionally abused: Not on file    Physically abused: Not on file    Forced sexual activity: Not on file  Other Topics Concern  . Not on file  Social History Narrative  . Not on file    Family History  Problem Relation Age of Onset  . Lung cancer Mother        smoked  . Heart failure Father   . Stroke Sister    Socially he is divorced has 3 children and 2 grandchildren. He is not routine exercise. No tobacco or alcohol use.  ROS General: Negative; No fevers, chills, or night sweats;  HEENT: Now with a hearing aid in his right ear due to decreased hearing., sinus congestion, difficulty swallowing Pulmonary: Shortness of breath, positive for asbestosis and interstitial pulmonary fibrosis Cardiovascular:  See HPI: No change in claudication GI: Negative; No nausea, vomiting, diarrhea, or abdominal pain GU: Negative; No dysuria, hematuria, or difficulty voiding Musculoskeletal: Negative; no myalgias, joint pain, or weakness Hematologic/Oncology: Negative; no easy bruising, bleeding Endocrine: Negative; no heat/cold intolerance; no diabetes Neuro: Occasional transient toe paresthesias; no changes in balance, headaches Skin: Negative; No rashes or skin lesions Psychiatric: Negative; No behavioral problems, depression Sleep: Positive for sleep apnea, currently untreated for the past 3-4 years.  Originally diagnosed in 2005, both obstructive and central events.  Positive for daytime sleepiness, hypersomnolence; no bruxism, restless legs, hypnogognic hallucinations, no cataplexy Other comprehensive 14 point system  review is negative.  PE BP 104/60   Pulse 72   Ht _0  (1.803 m)   Wt 185 lb 9.6 oz (84.2 kg)   BMI 25.89 kg/m    Repeat blood pressure was 106/60 supine and 102/60 standing  Wt Readings from Last 3 Encounters:  04/06/18 185 lb 9.6 oz (84.2 kg)  03/31/18 187 lb (84.8 kg)  01/26/18 183 lb 6 oz (83.2 kg)   General: Alert, oriented, no distress.  Skin: normal turgor, no rashes, warm and dry HEENT: Normocephalic, atraumatic. Pupils equal round and reactive to light; sclera anicteric; extraocular muscles intact;  Nose without nasal septal hypertrophy; right hearing aid Mouth/Parynx benign; Mallinpatti scale 3 Neck: No JVD, no carotid bruits; normal carotid upstroke Lungs: Decreased breath sounds without wheezing. Chest wall: without tenderness to palpitation Heart: PMI not displaced, RRR, s1 s2 normal, 1/6 systolic murmur, no diastolic murmur, no rubs, gallops, thrills, or heaves Abdomen: soft, nontender; no hepatosplenomehaly, BS+; abdominal aorta nontender and not dilated by palpation. Back: no CVA tenderness Pulses 2+ Musculoskeletal: full range of motion, normal strength, no joint deformities Extremities: no clubbing cyanosis or edema, Homan's sign negative  Neurologic: grossly nonfocal; Cranial nerves grossly wnl Psychologic: Normal mood and affect   ECG (independently read by me): Sinus rhythm with sinus arrhythmia, isolated PVC.  Left anterior hemiblock.  Early transition with prominent R wave in V1 and V2  September 2018 ECG (independently read by me): sinus bradycardia 51  bpm.  Left axis deviation.  Inferior Q waves with early transition.  Normal intervals.  April 2017 ECG (independently read by me): Normal sinus rhythm with PACs.  Heart rate 62 bpm.  Inferior posterior MI  November 2016 ECG (independently read by me):  Normal sinus rhythm at 62 bpm.   Incomplete right bundle branch block.  Prior ECG (independently read by me): Sinus bradycardia at 58 bpm with an  isolate a PVC and occasional PACs with mild sinus arrhythmia.  Evidence for prior inferior posterior infarct.  Nonspecific ST changes.  Prior 11/05/2013 ECG (independently read by me): Sinus bradycardia at 49 beats per minute.  QTc interval 426 ms.  PR interval 174 ms.  Mild RV conduction delay  Prior 04/26/2013 ECG: Marked sinus bradycardia 43 beats per minute. Left axis deviation. 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  LABS:  BMP Latest Ref Rng & Units 11/28/2017 03/25/2017 02/18/2017  Glucose 70 - 99 mg/dL 115(H) 101(H) 112(H)  BUN 6 - 23 mg/dL _0 Creatinine 0.40 - 1.50 mg/dL 1.07 1.20 1.20  BUN/Creat Ratio 10 - 24 - - -  Sodium 135 - 145 mEq/L 137 135 140  Potassium 3.5 - 5.1 mEq/L 3.8 4.1 4.5  Chloride 96 - 112 mEq/L 104 103 105  CO2 19 - 32 mEq/L 23 23 -  Calcium 8.4 - 10.5 mg/dL 10.2 10.1 -   Hepatic Function Latest Ref Rng & Units 03/31/2018 11/28/2017 05/12/2017  Total Protein 6.0 - 8.3 g/dL 7.7 7.7 7.8  Albumin 3.5 - 5.2 g/dL 4.4 4.3 4.3  AST 0 - 37 U/L _1 ALT 0 - 53 U/L _2 Alk Phosphatase 39 - 117 U/L 61 69 63  Total Bilirubin 0.2 - 1.2 mg/dL 0.5 0.5 0.5  Bilirubin, Direct 0.0 - 0.3 mg/dL 0.1 0.1 0.1   CBC Latest Ref Rng & Units 08/25/2017 03/25/2017 02/18/2017  WBC 4.0 - 10.5 K/uL 8.1 9.9 -  Hemoglobin 13.0 - 17.0 g/dL 15.3 15.9 16.7  Hematocrit 39.0 - 52.0 % 46.2 47.9 49.0  Platelets 150 - 400 K/uL 144(L) 157.0 -   Lab Results  Component Value Date   MCV 95.7 08/25/2017   MCV 94.0 03/25/2017   MCV 91.1 01/21/2017   No results found for: HGBA1C  Lab Results  Component Value Date   TSH 3.440 12/06/2016   Lipid Panel     Component Value Date/Time   CHOL 95 (L) 12/06/2016 0839   CHOL 118 05/05/2013 0839   TRIG 136 12/06/2016 0839   TRIG 202 (H) 05/05/2013 0839   HDL 34 (L) 12/06/2016 0839   HDL 38 (L) 05/05/2013 0839   CHOLHDL 2.8 12/06/2016 0839   CHOLHDL 2.6 05/20/2014 0823   VLDL 27 05/20/2014 0823    LDLCALC 34 12/06/2016 0839   LDLCALC 40 05/05/2013 0839     RADIOLOGY: No results found.  IMPRESSION: 1. Coronary artery disease involving native coronary artery of native heart without angina pectoris   2. Hx of CABG   3. Mixed hyperlipidemia   4. Mild  aortic stenosis   5. OSA (obstructive sleep apnea)   6. IPF (idiopathic pulmonary fibrosis) ([place])   7. PAD (peripheral artery disease) ([place])   8. Essential hypertension   9. Medication management   10. Asbestosis [facility])     ASSESSMENT AND PLAN: Troy Miller is a 77 year old Caucasian male who has CAD dating back to 1994 when he underwent his initial CABG surgery. He is  status post redo bypass surgery in 2005. He has peripheral vascular disease.    He has mild aortic stenosis with low-normal EF on his last echo.  His last nuclear study revealed inferior scar without associated ischemia. 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  He is status post remote right fem-tib bypass graft surgery in 1996. He is followed  by Dr. Trula Slade.  His blood pressure today is stable on his medical regimen consisting of valsartan HCT 80/12.5.  He has remote history of atrial fibrillation ablation.  He is on Xarelto 20 mg.  He is maintaining sinus rhythm.  He has obstructive sleep apnea continues to use CPAP with 100% compliance but is in need for a new mask.  He has a full facemask.  He has a history of significant mixed hyperlipidemia for which he had been on omega-3 fatty acid, niacin in addition to statin therapy.  He is no longer on omega-3 fatty acid but has continued to be on simvastatin 20 mg and niacin 2000 mg daily.  He has also developed interstitial pulmonary fibrosis and was recently started on Esbriet.  I will recheck chemistry profile lipid studies and thyroid function studies.  I will  try to discontinue niacin depending upon results. I will  contact him regarding his laboratory when complete.  Time spent: 25 minutes Troy Sine, MD, [facility]  04/08/2018 2:23 PM

## 2018-04-06 NOTE — Patient Instructions (Signed)
Medication Instructions:  Your physician recommends that you continue on your current medications as directed. Please refer to the Current Medication list given to you today.  Labwork: Please return for FASTING labs (CMET, Lipid, TSH) at PCP-orders provided  Our in office lab hours are Monday-Friday 8:00-4:00, closed for lunch 12:45-1:45 pm.  No appointment needed.  Follow-Up: Your physician wants you to follow-up in: 6 months with Dr. Claiborne Billings. You will receive a reminder letter in the mail two months in advance. If you don't receive a letter, please call our office to schedule the follow-up appointment.   Any Other Special Instructions Will Be Listed Below (If Applicable).     If you need a refill on your cardiac medications before your next appointment, please call your pharmacy.

## 2018-04-08 ENCOUNTER — Encounter: Payer: Self-pay | Admitting: Cardiovascular Disease

## 2018-04-11 LAB — COMPREHENSIVE METABOLIC PANEL
A/G RATIO: 1.4 (ref 1.2–2.2)
ALBUMIN: 4.4 g/dL (ref 3.5–4.8)
ALT: 6 IU/L (ref 0–44)
AST: 13 IU/L (ref 0–40)
Alkaline Phosphatase: 74 IU/L (ref 39–117)
BUN / CREAT RATIO: 16 (ref 10–24)
BUN: 17 mg/dL (ref 8–27)
Bilirubin Total: 0.4 mg/dL (ref 0.0–1.2)
CALCIUM: 9.8 mg/dL (ref 8.6–10.2)
CO2: 21 mmol/L (ref 20–29)
Chloride: 99 mmol/L (ref 96–106)
Creatinine, Ser: 1.07 mg/dL (ref 0.76–1.27)
GFR, EST AFRICAN AMERICAN: 76 mL/min/{1.73_m2} (ref >59)
GFR, EST NON AFRICAN AMERICAN: 66 mL/min/{1.73_m2} (ref >59)
GLOBULIN, TOTAL: 3.2 g/dL (ref 1.5–4.5)
Glucose: 102 mg/dL — ABNORMAL HIGH (ref 65–99)
POTASSIUM: 4.2 mmol/L (ref 3.5–5.2)
SODIUM: 138 mmol/L (ref 134–144)
Total Protein: 7.6 g/dL (ref 6.0–8.5)

## 2018-04-11 LAB — LIPID PANEL
CHOL/HDL RATIO: 2.4 ratio (ref 0.0–5.0)
Cholesterol, Total: 109 mg/dL (ref 100–199)
HDL: 45 mg/dL (ref >39)
LDL Calculated: 43 mg/dL (ref 0–99)
TRIGLYCERIDES: 106 mg/dL (ref 0–149)
VLDL Cholesterol Cal: 21 mg/dL (ref 5–40)

## 2018-04-11 LAB — TSH: TSH: 2.87 u[IU]/mL (ref 0.450–4.500)

## 2018-04-13 ENCOUNTER — Other Ambulatory Visit: Payer: Self-pay

## 2018-04-13 ENCOUNTER — Ambulatory Visit
Admission: RE | Admit: 2018-04-13 | Discharge: 2018-04-13 | Disposition: A | Discharge status: Home / Self Care | Payer: Medicare Other | Source: Ambulatory Visit | Attending: Radiation Oncology | Admitting: Radiation Oncology

## 2018-04-13 ENCOUNTER — Encounter: Payer: Self-pay | Admitting: Radiation Oncology

## 2018-04-13 VITALS — BP 91/73 | HR 63 | Temp 98.4°F | Resp 18 | Wt 186.2 lb

## 2018-04-13 DIAGNOSIS — Z7982 Long term (current) use of aspirin: Secondary | ICD-10-CM | POA: Diagnosis not present

## 2018-04-13 DIAGNOSIS — Z885 Allergy status to narcotic agent status: Secondary | ICD-10-CM | POA: Diagnosis not present

## 2018-04-13 DIAGNOSIS — Z7901 Long term (current) use of anticoagulants: Secondary | ICD-10-CM | POA: Insufficient documentation

## 2018-04-13 DIAGNOSIS — Z79899 Other long term (current) drug therapy: Secondary | ICD-10-CM | POA: Diagnosis not present

## 2018-04-13 DIAGNOSIS — C3431 Malignant neoplasm of lower lobe, right bronchus or lung: Secondary | ICD-10-CM | POA: Diagnosis present

## 2018-04-13 NOTE — Progress Notes (Signed)
Radiation Oncology         [phone] ________________________________  Name: Troy Miller MRN: [id number]  Date: 04/13/2018  DOB: 02-Mar-1941  Follow-Up Visit Note  CC: [name], Lucrezia Europe, MD    ICD-10-CM   1. 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 He states using 3L of oxygen at home as needed. He reports continues issues with breathing, mild to moderate fatigue, shortness of breath with activity, difficulty swallowing sometimes, productive cough with clear phlegm. He denies any pain, issues with his appetite, and any skin issues.           ALLERGIES:  is allergic to fentanyl and related.  Meds: Current Outpatient Medications  Medication Sig Dispense Refill  . acetaminophen (TYLENOL) 500 MG tablet Take 1,000 mg by mouth every 6 (six) hours as needed for moderate pain or headache.     Marland Kitchen aspirin 81 MG tablet Take 81 mg by mouth daily.    . calcium carbonate (OS-CAL) 600 MG TABS Take 600 mg by mouth 2 (two) times daily with a meal.    . Cholecalciferol (VITAMIN D) 2000 units CAPS Take 2,000 Units by mouth daily.    . famotidine (PEPCID) 20 MG tablet TAKE 1 TABLET BY MOUTH AT BEDTIME 90 tablet 1  . folic acid (FOLVITE) 1 MG tablet Take 1 mg by mouth every evening.     . niacin (NIASPAN) 500 MG CR tablet Take 2,000 mg by mouth every evening.     Marland Kitchen oxyCODONE-acetaminophen (PERCOCET) 10-325 MG tablet Take 1 tablet by mouth 3 (three) times daily as needed for severe pain.    . pantoprazole (PROTONIX) 40 MG tablet TAKE 1 TABLET BY MOUTH ONCE DAILY 30 TO 60 MINUTES BEFORE FIRST MEAL OF THE DAY 90 tablet 1  . Pirfenidone (ESBRIET) 267 MG TABS Take 3 tablets (801 mg  total) by mouth 3 (three) times daily. 189 tablet 0  . rivaroxaban (XARELTO) 20 MG TABS tablet Take 1 tablet (20 mg total) by mouth every morning. 14 tablet 0  . simvastatin (ZOCOR) 20 MG tablet TAKE 1 TABLET BY MOUTH EVERY DAY IN THE EVENING 90 tablet 1  . valsartan-hydrochlorothiazide (DIOVAN-HCT) 80-12.5 MG tablet TAKE 1 TABLET BY MOUTH EVERY DAY 90 tablet 1   No current facility-administered medications for this encounter.     Physical Findings: The patient is in no acute distress. Patient is alert and oriented.  weight is 186 lb 3.2 oz (84.5 kg). His oral temperature is 98.4 F (36.9 C). His blood pressure is 91/73 and his pulse is 63. His respiration is 18 and oxygen saturation is 95%.  Lungs are clear to auscultation bilaterally. Heart has regular rate and rhythm. No palpable cervical, supraclavicular, or axillary adenopathy. Abdomen soft, non-tender, normal bowel sounds.    Lab Findings: Lab Results  Component Value Date   WBC 8.1 08/25/2017   HGB 15.3 08/25/2017   HCT 46.2 08/25/2017   MCV 95.7 08/25/2017   PLT 144 (L) 08/25/2017    Radiographic Findings: No results found.  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 We will order unless his pulmonologist has already placed this order.  Plan: Patient will be scheduled for chest CT scan in the near future. Routine follow up in 6 months.  -----------------------------------  Blair Promise, PhD, MD   This document serves as a record of services personally performed by Gery Pray, MD. It was created on his behalf by Wilburn Mylar, a trained medical scribe. The creation of this record is based on the scribe's personal observations and the provider's statements to them. This document has been checked and approved by the attending provider.

## 2018-04-13 NOTE — Progress Notes (Signed)
[name] is here for a follow-up appointment. Patient denies any pain. States that his fatigue is mild to moderate. States that he gets shortness of breath with activity. States that he has difficulty with swallowing sometime. States that he coughs up clear phlegm .States that his appetite is good. Denies any issues with his skin. Patient blood pressure was low today. States that he was feeling light headed earlier but that he is not currently feeling light headed. Vitals:   04/13/18 1536 04/13/18 1537  BP: (!) 99/56 91/73  Pulse: 63   Resp: 18   Temp: 98.4 F (36.9 C)   TempSrc: Oral   SpO2: 95%   Weight: 186 lb 3.2 oz (84.5 kg)    Wt Readings from Last 3 Encounters:  04/13/18 186 lb 3.2 oz (84.5 kg)  04/06/18 185 lb 9.6 oz (84.2 kg)  03/31/18 187 lb (84.8 kg)

## 2018-04-14 ENCOUNTER — Telehealth: Payer: Self-pay | Admitting: *Deleted

## 2018-04-14 NOTE — Telephone Encounter (Signed)
Per patient request order foe CPAP supplies sent to [facility]. Faxed supply order and 04/06/18 office note to [phone].

## 2018-04-20 ENCOUNTER — Encounter: Payer: Self-pay | Admitting: Cardiovascular Disease

## 2018-04-24 ENCOUNTER — Other Ambulatory Visit: Payer: Self-pay

## 2018-04-24 ENCOUNTER — Ambulatory Visit
Admission: RE | Admit: 2018-04-24 | Discharge: 2018-04-24 | Disposition: A | Discharge status: Home / Self Care | Payer: Medicare Other | Source: Ambulatory Visit | Attending: Surgery | Admitting: Surgery

## 2018-04-24 DIAGNOSIS — I6529 Occlusion and stenosis of unspecified carotid artery: Secondary | ICD-10-CM

## 2018-04-24 DIAGNOSIS — I739 Peripheral vascular disease, unspecified: Secondary | ICD-10-CM

## 2018-04-24 DIAGNOSIS — I714 Abdominal aortic aneurysm, without rupture, unspecified: Secondary | ICD-10-CM

## 2018-04-24 MED ORDER — IOPAMIDOL (ISOVUE-370) INJECTION 76%
125.0000 mL | Freq: Once | INTRAVENOUS | Status: AC | PRN
  Administered 2018-04-24: 125 mL via INTRAVENOUS

## 2018-04-27 ENCOUNTER — Encounter: Payer: Self-pay | Admitting: Surgery

## 2018-04-27 ENCOUNTER — Ambulatory Visit (HOSPITAL_COMMUNITY)
Admission: RE | Admit: 2018-04-27 | Discharge: 2018-04-27 | Disposition: A | Discharge status: Home / Self Care | Payer: Medicare Other | Source: Ambulatory Visit | Attending: Surgery | Admitting: Surgery

## 2018-04-27 ENCOUNTER — Ambulatory Visit: Payer: Medicare Other | Admitting: Surgery

## 2018-04-27 ENCOUNTER — Ambulatory Visit (INDEPENDENT_AMBULATORY_CARE_PROVIDER_SITE_OTHER): Payer: Medicare Other | Admitting: Surgery

## 2018-04-27 ENCOUNTER — Other Ambulatory Visit: Payer: Self-pay | Admitting: Surgery

## 2018-04-27 ENCOUNTER — Other Ambulatory Visit: Payer: Self-pay

## 2018-04-27 VITALS — BP 110/62 | HR 58 | Temp 97.2°F | Resp 18 | Ht 71.0 in | Wt 181.0 lb

## 2018-04-27 DIAGNOSIS — I6529 Occlusion and stenosis of unspecified carotid artery: Secondary | ICD-10-CM | POA: Diagnosis present

## 2018-04-27 DIAGNOSIS — I724 Aneurysm of artery of lower extremity: Secondary | ICD-10-CM

## 2018-04-27 NOTE — Progress Notes (Signed)
[facility]ascular and [facility]in Specialist of [facility]  Patient name: Troy Miller MRN: [id number] DOB: 1940/10/21 Sex: male   REASON FOR VISIT:    Follow up  HISOTRY OF PRESENT ILLNESS:    Troy Miller is a 77 y.o. male who is back today for discussions of his CT scan for further evaluation of his vascular disease.  He has a history of a right femoral to tibial bypass in 1996 in [place] for claudication.  He recently had Doppler studies that revealed an aneurysm at the right proximal anastomosis.  His ABIs 1.1.  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  There is also a 3.6 cm abdominal aortic aneurysm.  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  He has a history of CABG in 1994 and redo CABG in 2005. His bilateral great saphenous veins have been used for bypass. He is on Xarelto for atrial flutter. He is on a statin for hyperlipidemia. He is on an ARB for hypertension. He is on home 02 at night. He is a former smoker quitting over 30 years ago. He denies any history of CVA. He denies any recent cardiac issues. Has history of DVT.     PAST MEDICAL HISTORY:   Past Medical History:  Diagnosis Date  . AAA (abdominal aortic aneurysm) ([place])   . Aortic stenosis, mild    07/10/12 EF 50-55% on echo-mitral annular ca+ also  . Atrial fibrillation ([place])    03/19/10 ablation-[name]  . Atrial flutter ([place])   . CAD (coronary artery disease)   . DVT (deep venous thrombosis) ([place])   . History of stress test    Myoview 8/16:  EF 47%, inferior scar, no ischemia, Intermediate risk (low EF)  . Hyperlipemia   . Hypertension   . Iliac artery aneurysm ([place])   . PVD (peripheral vascular disease) ([place])    remote right fem-tib bypass graft sugery  . 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 Lung cancer Mother      smoked  . Heart failure Father   . Stroke Sister     SOCIAL HISTORY:   Social History   Tobacco Use  . Smoking status: Former Smoker    Packs/day: 2.00    Years: 25.00    Pack years: 50.00    Types: Cigarettes    Last attempt to quit: 04/26/1986    Years since quitting: 32.0  . Smokeless tobacco: Never Used  Substance Use Topics  . Alcohol use: No    Alcohol/week: 0.0 standard drinks     ALLERGIES:   Allergies  Allergen Reactions  . Fentanyl And Related Other (See Comments)    Hallucinations     CURRENT MEDICATIONS:   Current Outpatient Medications  Medication Sig Dispense Refill  . acetaminophen (TYLENOL) 500 MG tablet Take 1,000 mg by mouth every 6 (six) hours as needed for moderate pain or headache.     Marland Kitchen aspirin 81 MG tablet Take 81 mg by mouth daily.    . calcium carbonate (OS-CAL) 600 MG TABS Take 600 mg by mouth 2 (two) times daily with a meal.    . Cholecalciferol (VITAMIN D) 2000 units CAPS Take 2,000 Units by mouth daily.    . famotidine (PEPCID) 20 MG tablet TAKE 1 TABLET BY MOUTH AT BEDTIME 90 tablet 1  . folic acid (  FOLVITE) 1 MG tablet Take 1 mg by mouth every evening.     . niacin (NIASPAN) 500 MG CR tablet Take 2,000 mg by mouth every evening.     Marland Kitchen oxyCODONE-acetaminophen (PERCOCET) 10-325 MG tablet Take 1 tablet by mouth 3 (three) times daily as needed for severe pain.    . pantoprazole (PROTONIX) 40 MG tablet TAKE 1 TABLET BY MOUTH ONCE DAILY 30 TO 60 MINUTES BEFORE FIRST MEAL OF THE DAY 90 tablet 1  . Pirfenidone (ESBRIET) 267 MG TABS Take 3 tablets (801 mg total) by mouth 3 (three) times daily. 189 tablet 0  . rivaroxaban (XARELTO) 20 MG TABS tablet Take 1 tablet (20 mg total) by mouth every morning. 14 tablet 0  . simvastatin (ZOCOR) 20 MG tablet TAKE 1 TABLET BY MOUTH EVERY DAY IN THE EVENING 90 tablet 1  . valsartan-hydrochlorothiazide (DIOVAN-HCT) 80-12.5 MG tablet TAKE 1 TABLET BY MOUTH EVERY DAY 90 tablet 1   No current  facility-administered medications for this visit.     REVIEW OF SYSTEMS:   _0  denotes positive finding, _1  denotes negative finding Cardiac  Comments:  Chest pain or chest pressure:    Shortness of breath upon exertion:    Short of breath when lying flat:    Irregular heart rhythm:        Vascular    Pain in calf, thigh, or hip brought on by ambulation:    Pain in feet at night that wakes you up from your sleep:     Blood clot in your veins:    Leg swelling:         Pulmonary    Oxygen at home:    Productive cough:     Wheezing:         Neurologic    Sudden weakness in arms or legs:     Sudden numbness in arms or legs:     Sudden onset of difficulty speaking or slurred speech:    Temporary loss of vision in one eye:     Problems with dizziness:         Gastrointestinal    Blood in stool:     Vomited blood:         Genitourinary    Burning when urinating:     Blood in urine:        Psychiatric    Major depression:         Hematologic    Bleeding problems:    Problems with blood clotting too easily:        Skin    Rashes or ulcers:        Constitutional    Fever or chills:      PHYSICAL EXAM:   There were no vitals filed for this visit.  GENERAL: The patient is a well-nourished male, in no acute distress. The vital signs are documented above. CARDIAC: There is a regular rate and rhythm.  VASCULAR: Pulsatile mass in the right upper thigh PULMONARY: Non-labored respirations ABDOMEN: Soft and non-tender with normal pitched bowel sounds.  MUSCULOSKELETAL: There are no major deformities or cyanosis. NEUROLOGIC: No focal weakness or paresthesias are detected. SKIN: There are no ulcers or rashes noted. PSYCHIATRIC: The patient has a normal affect.  STUDIES:   I have reviewed the CT scan with the following findings:  VASCULAR  1. Stable aneurysmal disease of the abdominal aorta, bilateral common iliac arteries, bilateral common femoral arteries,  the proximal right SFA and the proximal left profunda  femoral trunk. 2. Interval enlargement of a largely thrombosed pseudoaneurysm at the proximal anastomosis of a right mid SFA to posterior tibial bypass graft. Delayed imaging today helps to delineate the pseudoaneurysm more clearly which measures approximately 4 cm in greatest transverse diameter and 5 cm in craniocaudal height. The bypass graft remains patent with patent anastomosis to a diseased right posterior tibial artery below the knee. There may be faint reconstituted flow in the right peroneal and anterior tibial arteries. 3. Stable patency of previously placed stents in the left SFA and popliteal arteries. The covered stents successfully exclude an elongated component of proximal popliteal artery aneurysmal disease. There remains an additional component of uncovered popliteal artery aneurysmal disease just above the knee joint and posterior to the femoral condyles measuring 2.3 cm in greatest diameter and demonstrating opacification with contrast just below the end of the most distal popliteal stent. Below the knee, the tibioperoneal trunk and anterior tibial artery demonstrates significant disease but flow is demonstrated in the peroneal and anterior tibial arteries distally. The posterior tibial artery is chronically occluded.  NON-VASCULAR  1. Interval increased opacity at the posterior right lung base is consistent with evolving post radiation therapy changes to the lung from recent therapy of a squamous lung carcinoma. 2. Stable small right inguinal hernia containing fat.  Carotid duplex: 1-39% stenosis bilaterally.   MEDICAL ISSUES:   Right bypass graft pseudoaneurysm: By CT scan, this has enlarged.  The patient also feels that has gotten bigger.  We discussed surgical versus endovascular repair.  I think the first option should be a Biobond covered stent across the proximal anastomosis.  I have scheduled this  for next Tuesday, October 29.  I will also evaluate the stents within his left popliteal artery as he will need to have these extended across the knee joint given that the distal extent of the aneurysm is no longer covered.  Carotid stenosis: No significant disease on ultrasound.  Iliac artery aneurysm: This will be monitored and 6 months with a duplex (aortoiliac and bilateral lower extremity)  Annamarie Major, MD [facility] [phone] Pager [phone]

## 2018-04-27 NOTE — H&P (View-Only) (Signed)
Vascular and Vein Specialist of [facility]  Patient name: Troy Miller MRN: [id number] DOB: 1940/10/21 Sex: male   REASON FOR VISIT:    Follow up  HISOTRY OF PRESENT ILLNESS:    Troy Miller is a 77 y.o. male who is back today for discussions of his CT scan for further evaluation of his vascular disease.  He has a history of a right femoral to tibial bypass in 1996 in [place] for claudication.  He recently had Doppler studies that revealed an aneurysm at the right proximal anastomosis.  His ABIs 1.1.  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  There is also a 3.6 cm abdominal aortic aneurysm.  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  He has a history of CABG in 1994 and redo CABG in 2005. His bilateral great saphenous veins have been used for bypass. He is on Xarelto for atrial flutter. He is on a statin for hyperlipidemia. He is on an ARB for hypertension. He is on home 02 at night. He is a former smoker quitting over 30 years ago. He denies any history of CVA. He denies any recent cardiac issues. Has history of DVT.     PAST MEDICAL HISTORY:   Past Medical History:  Diagnosis Date  . AAA (abdominal aortic aneurysm) ([place])   . Aortic stenosis, mild    07/10/12 EF 50-55% on echo-mitral annular ca+ also  . Atrial fibrillation ([place])    03/19/10 ablation-[name]  . Atrial flutter ([place])   . CAD (coronary artery disease)   . DVT (deep venous thrombosis) ([place])   . History of stress test    Myoview 8/16:  EF 47%, inferior scar, no ischemia, Intermediate risk (low EF)  . Hyperlipemia   . Hypertension   . Iliac artery aneurysm ([place])   . PVD (peripheral vascular disease) ([place])    remote right fem-tib bypass graft sugery  . 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 Lung cancer Mother      smoked  . Heart failure Father   . Stroke Sister     SOCIAL HISTORY:   Social History   Tobacco Use  . Smoking status: Former Smoker    Packs/day: 2.00    Years: 25.00    Pack years: 50.00    Types: Cigarettes    Last attempt to quit: 04/26/1986    Years since quitting: 32.0  . Smokeless tobacco: Never Used  Substance Use Topics  . Alcohol use: No    Alcohol/week: 0.0 standard drinks     ALLERGIES:   Allergies  Allergen Reactions  . Fentanyl And Related Other (See Comments)    Hallucinations     CURRENT MEDICATIONS:   Current Outpatient Medications  Medication Sig Dispense Refill  . acetaminophen (TYLENOL) 500 MG tablet Take 1,000 mg by mouth every 6 (six) hours as needed for moderate pain or headache.     Marland Kitchen aspirin 81 MG tablet Take 81 mg by mouth daily.    . calcium carbonate (OS-CAL) 600 MG TABS Take 600 mg by mouth 2 (two) times daily with a meal.    . Cholecalciferol (VITAMIN D) 2000 units CAPS Take 2,000 Units by mouth daily.    . famotidine (PEPCID) 20 MG tablet TAKE 1 TABLET BY MOUTH AT BEDTIME 90 tablet 1  . folic acid (  FOLVITE) 1 MG tablet Take 1 mg by mouth every evening.     . niacin (NIASPAN) 500 MG CR tablet Take 2,000 mg by mouth every evening.     Marland Kitchen oxyCODONE-acetaminophen (PERCOCET) 10-325 MG tablet Take 1 tablet by mouth 3 (three) times daily as needed for severe pain.    . pantoprazole (PROTONIX) 40 MG tablet TAKE 1 TABLET BY MOUTH ONCE DAILY 30 TO 60 MINUTES BEFORE FIRST MEAL OF THE DAY 90 tablet 1  . Pirfenidone (ESBRIET) 267 MG TABS Take 3 tablets (801 mg total) by mouth 3 (three) times daily. 189 tablet 0  . rivaroxaban (XARELTO) 20 MG TABS tablet Take 1 tablet (20 mg total) by mouth every morning. 14 tablet 0  . simvastatin (ZOCOR) 20 MG tablet TAKE 1 TABLET BY MOUTH EVERY DAY IN THE EVENING 90 tablet 1  . valsartan-hydrochlorothiazide (DIOVAN-HCT) 80-12.5 MG tablet TAKE 1 TABLET BY MOUTH EVERY DAY 90 tablet 1   No current  facility-administered medications for this visit.     REVIEW OF SYSTEMS:   _0  denotes positive finding, _1  denotes negative finding Cardiac  Comments:  Chest pain or chest pressure:    Shortness of breath upon exertion:    Short of breath when lying flat:    Irregular heart rhythm:        Vascular    Pain in calf, thigh, or hip brought on by ambulation:    Pain in feet at night that wakes you up from your sleep:     Blood clot in your veins:    Leg swelling:         Pulmonary    Oxygen at home:    Productive cough:     Wheezing:         Neurologic    Sudden weakness in arms or legs:     Sudden numbness in arms or legs:     Sudden onset of difficulty speaking or slurred speech:    Temporary loss of vision in one eye:     Problems with dizziness:         Gastrointestinal    Blood in stool:     Vomited blood:         Genitourinary    Burning when urinating:     Blood in urine:        Psychiatric    Major depression:         Hematologic    Bleeding problems:    Problems with blood clotting too easily:        Skin    Rashes or ulcers:        Constitutional    Fever or chills:      PHYSICAL EXAM:   There were no vitals filed for this visit.  GENERAL: The patient is a well-nourished male, in no acute distress. The vital signs are documented above. CARDIAC: There is a regular rate and rhythm.  VASCULAR: Pulsatile mass in the right upper thigh PULMONARY: Non-labored respirations ABDOMEN: Soft and non-tender with normal pitched bowel sounds.  MUSCULOSKELETAL: There are no major deformities or cyanosis. NEUROLOGIC: No focal weakness or paresthesias are detected. SKIN: There are no ulcers or rashes noted. PSYCHIATRIC: The patient has a normal affect.  STUDIES:   I have reviewed the CT scan with the following findings:  VASCULAR  1. Stable aneurysmal disease of the abdominal aorta, bilateral common iliac arteries, bilateral common femoral arteries,  the proximal right SFA and the proximal left profunda  femoral trunk. 2. Interval enlargement of a largely thrombosed pseudoaneurysm at the proximal anastomosis of a right mid SFA to posterior tibial bypass graft. Delayed imaging today helps to delineate the pseudoaneurysm more clearly which measures approximately 4 cm in greatest transverse diameter and 5 cm in craniocaudal height. The bypass graft remains patent with patent anastomosis to a diseased right posterior tibial artery below the knee. There may be faint reconstituted flow in the right peroneal and anterior tibial arteries. 3. Stable patency of previously placed stents in the left SFA and popliteal arteries. The covered stents successfully exclude an elongated component of proximal popliteal artery aneurysmal disease. There remains an additional component of uncovered popliteal artery aneurysmal disease just above the knee joint and posterior to the femoral condyles measuring 2.3 cm in greatest diameter and demonstrating opacification with contrast just below the end of the most distal popliteal stent. Below the knee, the tibioperoneal trunk and anterior tibial artery demonstrates significant disease but flow is demonstrated in the peroneal and anterior tibial arteries distally. The posterior tibial artery is chronically occluded.  NON-VASCULAR  1. Interval increased opacity at the posterior right lung base is consistent with evolving post radiation therapy changes to the lung from recent therapy of a squamous lung carcinoma. 2. Stable small right inguinal hernia containing fat.  Carotid duplex: 1-39% stenosis bilaterally.   MEDICAL ISSUES:   Right bypass graft pseudoaneurysm: By CT scan, this has enlarged.  The patient also feels that has gotten bigger.  We discussed surgical versus endovascular repair.  I think the first option should be a Biobond covered stent across the proximal anastomosis.  I have scheduled this  for next Tuesday, October 29.  I will also evaluate the stents within his left popliteal artery as he will need to have these extended across the knee joint given that the distal extent of the aneurysm is no longer covered.  Carotid stenosis: No significant disease on ultrasound.  Iliac artery aneurysm: This will be monitored and 6 months with a duplex (aortoiliac and bilateral lower extremity)  Annamarie Major, MD [facility] [phone] Pager [phone]

## 2018-05-05 ENCOUNTER — Other Ambulatory Visit: Payer: Self-pay

## 2018-05-05 ENCOUNTER — Encounter (HOSPITAL_COMMUNITY)
Admission: RE | Disposition: A | Discharge status: Home / Self Care | Payer: Self-pay | Source: Ambulatory Visit | Attending: Surgery

## 2018-05-05 ENCOUNTER — Encounter (HOSPITAL_COMMUNITY): Payer: Self-pay | Admitting: Surgery

## 2018-05-05 ENCOUNTER — Ambulatory Visit (HOSPITAL_COMMUNITY)
Admission: RE | Admit: 2018-05-05 | Discharge: 2018-05-05 | Disposition: A | Discharge status: Home / Self Care | Payer: Medicare Other | Source: Ambulatory Visit | Attending: Surgery | Admitting: Surgery

## 2018-05-05 DIAGNOSIS — Z823 Family history of stroke: Secondary | ICD-10-CM | POA: Insufficient documentation

## 2018-05-05 DIAGNOSIS — I10 Essential (primary) hypertension: Secondary | ICD-10-CM | POA: Diagnosis not present

## 2018-05-05 DIAGNOSIS — Z87891 Personal history of nicotine dependence: Secondary | ICD-10-CM | POA: Insufficient documentation

## 2018-05-05 DIAGNOSIS — I724 Aneurysm of artery of lower extremity: Secondary | ICD-10-CM | POA: Insufficient documentation

## 2018-05-05 DIAGNOSIS — I4891 Unspecified atrial fibrillation: Secondary | ICD-10-CM | POA: Insufficient documentation

## 2018-05-05 DIAGNOSIS — I714 Abdominal aortic aneurysm, without rupture: Secondary | ICD-10-CM | POA: Insufficient documentation

## 2018-05-05 DIAGNOSIS — Z86718 Personal history of other venous thrombosis and embolism: Secondary | ICD-10-CM | POA: Insufficient documentation

## 2018-05-05 DIAGNOSIS — Z885 Allergy status to narcotic agent status: Secondary | ICD-10-CM | POA: Insufficient documentation

## 2018-05-05 DIAGNOSIS — I350 Nonrheumatic aortic (valve) stenosis: Secondary | ICD-10-CM | POA: Insufficient documentation

## 2018-05-05 DIAGNOSIS — E785 Hyperlipidemia, unspecified: Secondary | ICD-10-CM | POA: Diagnosis not present

## 2018-05-05 DIAGNOSIS — Z951 Presence of aortocoronary bypass graft: Secondary | ICD-10-CM | POA: Insufficient documentation

## 2018-05-05 DIAGNOSIS — I4892 Unspecified atrial flutter: Secondary | ICD-10-CM | POA: Insufficient documentation

## 2018-05-05 DIAGNOSIS — I2510 Atherosclerotic heart disease of native coronary artery without angina pectoris: Secondary | ICD-10-CM | POA: Diagnosis not present

## 2018-05-05 DIAGNOSIS — I723 Aneurysm of iliac artery: Secondary | ICD-10-CM | POA: Insufficient documentation

## 2018-05-05 DIAGNOSIS — Z79899 Other long term (current) drug therapy: Secondary | ICD-10-CM | POA: Insufficient documentation

## 2018-05-05 DIAGNOSIS — Z7901 Long term (current) use of anticoagulants: Secondary | ICD-10-CM | POA: Diagnosis not present

## 2018-05-05 DIAGNOSIS — Z8249 Family history of ischemic heart disease and other diseases of the circulatory system: Secondary | ICD-10-CM | POA: Diagnosis not present

## 2018-05-05 DIAGNOSIS — Z7982 Long term (current) use of aspirin: Secondary | ICD-10-CM | POA: Insufficient documentation

## 2018-05-05 DIAGNOSIS — T82898A Other specified complication of vascular prosthetic devices, implants and grafts, initial encounter: Secondary | ICD-10-CM | POA: Diagnosis not present

## 2018-05-05 HISTORY — PX: PERIPHERAL VASCULAR INTERVENTION: CATH118257

## 2018-05-05 HISTORY — PX: ABDOMINAL AORTOGRAM W/LOWER EXTREMITY: CATH118223

## 2018-05-05 LAB — POCT I-STAT, CHEM 8
BUN: 25 mg/dL — ABNORMAL HIGH (ref 8–23)
CHLORIDE: 102 mmol/L (ref 98–111)
CREATININE: 1.5 mg/dL — AB (ref 0.61–1.24)
Calcium, Ion: 1.29 mmol/L (ref 1.15–1.40)
Glucose, Bld: 105 mg/dL — ABNORMAL HIGH (ref 70–99)
HEMATOCRIT: 44 % (ref 39.0–52.0)
HEMOGLOBIN: 15 g/dL (ref 13.0–17.0)
POTASSIUM: 3.9 mmol/L (ref 3.5–5.1)
Sodium: 140 mmol/L (ref 135–145)
TCO2: 27 mmol/L (ref 22–32)

## 2018-05-05 LAB — POCT ACTIVATED CLOTTING TIME: ACTIVATED CLOTTING TIME: 230 s

## 2018-05-05 SURGERY — ABDOMINAL AORTOGRAM W/LOWER EXTREMITY
Anesthesia: LOCAL

## 2018-05-05 MED ORDER — HEPARIN (PORCINE) IN NACL 1000-0.9 UT/500ML-% IV SOLN
INTRAVENOUS | Status: AC
  Filled 2018-05-05: qty 500

## 2018-05-05 MED ORDER — HYDROMORPHONE HCL 1 MG/ML IJ SOLN
INTRAMUSCULAR | Status: DC | PRN
  Administered 2018-05-05 (×2): 0.5 mg via INTRAVENOUS

## 2018-05-05 MED ORDER — IODIXANOL 320 MG/ML IV SOLN
INTRAVENOUS | Status: DC | PRN
  Administered 2018-05-05: 105 mL via INTRA_ARTERIAL

## 2018-05-05 MED ORDER — MIDAZOLAM HCL 2 MG/2ML IJ SOLN
INTRAMUSCULAR | Status: DC | PRN
  Administered 2018-05-05 (×2): 1 mg via INTRAVENOUS

## 2018-05-05 MED ORDER — LIDOCAINE HCL (PF) 1 % IJ SOLN
INTRAMUSCULAR | Status: AC
  Filled 2018-05-05: qty 30

## 2018-05-05 MED ORDER — HEPARIN (PORCINE) IN NACL 1000-0.9 UT/500ML-% IV SOLN
INTRAVENOUS | Status: DC | PRN
  Administered 2018-05-05 (×2): 500 mL

## 2018-05-05 MED ORDER — LIDOCAINE HCL (PF) 1 % IJ SOLN
INTRAMUSCULAR | Status: DC | PRN
  Administered 2018-05-05: 15 mL via INTRADERMAL

## 2018-05-05 MED ORDER — HYDROMORPHONE HCL 1 MG/ML IJ SOLN
INTRAMUSCULAR | Status: AC
  Filled 2018-05-05: qty 0.5

## 2018-05-05 MED ORDER — MIDAZOLAM HCL 2 MG/2ML IJ SOLN
INTRAMUSCULAR | Status: AC
  Filled 2018-05-05: qty 2

## 2018-05-05 MED ORDER — SODIUM CHLORIDE 0.9 % IV SOLN
INTRAVENOUS | Status: DC
  Administered 2018-05-05: 06:00:00 via INTRAVENOUS

## 2018-05-05 MED ORDER — HEPARIN SODIUM (PORCINE) 1000 UNIT/ML IJ SOLN
INTRAMUSCULAR | Status: DC | PRN
  Administered 2018-05-05: 8000 [IU] via INTRAVENOUS

## 2018-05-05 SURGICAL SUPPLY — 28 items
BAG SNAP BAND KOVER 36X36 (MISCELLANEOUS) ×3 IMPLANT
BALLN MUSTANG 7X100X135 (BALLOONS) ×3
BALLOON MUSTANG 7X100X135 (BALLOONS) ×2 IMPLANT
CATH OMNI FLUSH 5F 65CM (CATHETERS) ×3 IMPLANT
CATH QUICKCROSS .035X135CM (MICROCATHETER) ×3 IMPLANT
CATH STRAIGHT 5FR 65CM (CATHETERS) ×3 IMPLANT
CLOSURE MYNX CONTROL 6F/7F (Vascular Products) ×6 IMPLANT
COVER DOME SNAP 22 D (MISCELLANEOUS) ×3 IMPLANT
DEVICE TORQUE H2O (MISCELLANEOUS) ×3 IMPLANT
GUIDEWIRE ANGLED .035X150CM (WIRE) ×3 IMPLANT
KIT ENCORE 26 ADVANTAGE (KITS) ×3 IMPLANT
KIT MICROPUNCTURE NIT STIFF (SHEATH) ×3 IMPLANT
KIT PV (KITS) ×3 IMPLANT
SHEATH FLEXOR ANSEL 1 7F 45CM (SHEATH) ×3 IMPLANT
SHEATH PINNACLE 5F 10CM (SHEATH) ×3 IMPLANT
SHEATH PINNACLE 7F 10CM (SHEATH) ×6 IMPLANT
SHEATH PROBE COVER 6X72 (BAG) ×3 IMPLANT
STENT VIABAHN 7X100X120 (Permanent Stent) ×3 IMPLANT
STENT VIABAHN 8X100X120 (Permanent Stent) ×1 IMPLANT
STENT VIABAHN 8X10X120 (Permanent Stent) ×2 IMPLANT
SYR MEDRAD MARK V 150ML (SYRINGE) ×3 IMPLANT
TRANSDUCER W/STOPCOCK (MISCELLANEOUS) ×3 IMPLANT
TRAY PV CATH (CUSTOM PROCEDURE TRAY) ×3 IMPLANT
WIRE BENTSON .035X145CM (WIRE) ×3 IMPLANT
WIRE G V18X300CM (WIRE) ×3 IMPLANT
WIRE HI TORQ VERSACORE J 260CM (WIRE) ×3 IMPLANT
WIRE ROSEN-J .035X260CM (WIRE) ×3 IMPLANT
WIRE SPARTACORE .014X300CM (WIRE) ×3 IMPLANT

## 2018-05-05 NOTE — Discharge Instructions (Signed)
Femoral Site Care Refer to this sheet in the next few weeks. These instructions provide you with information about caring for yourself after your procedure. Your health care provider may also give you more specific instructions. Your treatment has been planned according to current medical practices, but problems sometimes occur. Call your health care provider if you have any problems or questions after your procedure. What can I expect after the procedure? After your procedure, it is typical to have the following:  Bruising at the site that usually fades within 1-2 weeks.  Blood collecting in the tissue (hematoma) that may be painful to the touch. It should usually decrease in size and tenderness within 1-2 weeks.  Follow these instructions at home:  Take medicines only as directed by your health care provider.  You may shower 24-48 hours after the procedure or as directed by your health care provider. Remove the bandage (dressing) and gently wash the site with plain soap and water. Pat the area dry with a clean towel. Do not rub the site, because this may cause bleeding.  Do not take baths, swim, or use a hot tub until your health care provider approves.  Check your insertion site every day for redness, swelling, or drainage.  Do not apply powder or lotion to the site.  Limit use of stairs to twice a day for the first 2-3 days or as directed by your health care provider.  Do not squat for the first 2-3 days or as directed by your health care provider.  Do not lift over 10 lb (4.5 kg) for 5 days after your procedure or as directed by your health care provider.  Ask your health care provider when it is okay to: ? Return to work or school. ? Resume usual physical activities or sports. ? Resume sexual activity.  Do not drive home if you are discharged the same day as the procedure. Have someone else drive you.  You may drive 24 hours after the procedure unless otherwise instructed by  your health care provider.  Do not operate machinery or power tools for 24 hours after the procedure or as directed by your health care provider.  If your procedure was done as an outpatient procedure, which means that you went home the same day as your procedure, a responsible adult should be with you for the first 24 hours after you arrive home.  Keep all follow-up visits as directed by your health care provider. This is important. Contact a health care provider if:  You have a fever.  You have chills.  You have increased bleeding from the site. Hold pressure on the site. Get help right away if:  You have unusual pain at the site.  You have redness, warmth, or swelling at the site.  You have drainage (other than a small amount of blood on the dressing) from the site.  The site is bleeding, and the bleeding does not stop after 30 minutes of holding steady pressure on the site.  Your leg or foot becomes pale, cool, tingly, or numb. This information is not intended to replace advice given to you by your health care provider. Make sure you discuss any questions you have with your health care provider. Document Released: 02/25/2014 Document Revised: 11/30/2015 Document Reviewed: 01/11/2014 Elsevier Interactive Patient Education  [employer].

## 2018-05-05 NOTE — Op Note (Signed)
Patient name: Troy Miller MRN: [id number] DOB: 1941/03/22 Sex: male  05/05/2018 Pre-operative Diagnosis: #1: Right femoral-popliteal bypass graft anastomosis aneurysm.  #2: Left popliteal aneurysm Post-operative diagnosis:  Same Surgeon:  Annamarie Major Procedure Performed:  1.  Ultrasound-guided access, right femoral artery  2.  Ultrasound-guided access, left femoral artery  3.  Abdominal aortogram  4.  Bilateral lower extremity renal  5.  Stent, right superficial femoral artery  6.  Stent, left popliteal artery  7.  Conscious sedation (120 minutes)   Indications: The patient has had aneurysmal degeneration of multiple areas.  He recently underwent endoluminal repair of a left popliteal aneurysm.  I have been following an anastomotic aneurysm of the proximal right femoral-popliteal bypass graft.  His most recent CT scan showed that the stents within the left popliteal artery had migrated proximally and at the distal aneurysm in the left popliteal artery is no longer excluded.  He has also had significant growth in the aneurysmal degeneration of the right femoral-popliteal bypass graft anastomosis.  He comes in today for further evaluation.  Procedure:  The patient was identified in the holding area and taken to room 8.  The patient was then placed supine on the table and prepped and draped in the usual sterile fashion.  A time out was called.  Conscious sedation was administered with the use of IV fentanyl Versed under continuous physician and nurse monitoring.  Heart rate, blood pressure, and oxygen saturation were continuously monitored.  Ultrasound was used to evaluate the left common femoral artery.  It was patent .  A digital ultrasound image was acquired.  A micropuncture needle was used to access the left common femoral artery under ultrasound guidance.  An 018 wire was advanced without resistance and a micropuncture sheath was placed.  The 018 wire was removed and a benson wire  was placed.  The micropuncture sheath was exchanged for a 5 french sheath.  An omniflush catheter was advanced over the wire to the level of L-1.  An abdominal angiogram was obtained.  Next, using the omniflush catheter and a benson wire, the aortic bifurcation was crossed and the catheter was placed into theright external iliac artery and right runoff was obtained.  left runoff was performed via retrograde sheath injections.  Findings:   Aortogram: Aneurysmal degeneration of the infrarenal abdominal aorta and bilateral iliac arteries.  Actual measurements cannot be obtained from angiographic imaging.  Right Lower Extremity: The right common femoral profundofemoral artery are heavily calcified but patent.  Superficial femoral artery is occluded.  There is a bypass graft originating from the superficial femoral artery with a proximal stenosis and luminal irregularity at the anastomosis.  The distal anastomosis is widely patent and the runoff is to the peroneal artery.  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  Single-vessel runoff through the peroneal artery.  Intervention: After the above images were acquired the decision was made to proceed with intervention.  A 7 French 45 cm sheath was advanced into the right superficial femoral artery.  The patient was fully heparinized.  I advanced a [place] core wire into the bypass graft and then selected a 8 x 100 Viabahn stent across the aneurysmal area and postdilated it with a 7 mm balloon.  Follow-up imaging revealed resolution of the aneurysm as well as the stenosis.  At this  point catheters and wires were removed.  The long sheath in the groin was exchanged out for a short 7 French sheath and a Mynx was used for closure.  Attention was then turned towards the right groin.  It was evaluated with  ultrasound in the common femoral artery was found widely patent but calcified.  A digital ultrasound image was acquired.  The right femoral artery was then cannulated under ultrasound guidance a micropuncture needle.  An 018 wire was advanced without resistance followed by placement of micropuncture sheath.  An 035 wire was inserted into the aorta and a 7 French sheath was placed.  Using the Omni Flush catheter the aortic bifurcation was crossed.  A 7 French sheath was placed into the left common femoral artery.  I then passed a V 18 wire down into the peroneal artery.  I used a 7 x 100Viabahn stent to exclude the distal popliteal aneurysm.  The stent went into the below-knee popliteal artery for approximately 2 cm.  I went this far down to make sure that I had good exclusion.  There is also a tortuous area in the popliteal artery at the patella that was likely why the stent migrated.  This area was postdilated with a 7 mm balloon.  Follow-up imaging revealed widely patent area with successful exclusion of aneurysm and single-vessel runoff was maintained.  At this point, all wires were removed.  The long 7 French sheath was exchanged out for a short 7 [place] sheath and a Mynx was used for closure  Impression:  #1  Successful exclusion of the proximal anastomotic aneurysmal degeneration in the right femoral-popliteal bypass graft using a 8 x 100 Viabahn  #2  Proximal migration of the stents within the left popliteal aneurysm.  I extended distally with a 7 x 100 Viabahn    Theotis Burrow, M.D. [facility] [place] Office: [phone] Pager:  [phone]

## 2018-05-05 NOTE — Interval H&P Note (Signed)
History and Physical Interval Note:  05/05/2018 7:41 AM  Troy Miller  has presented today for surgery, with the diagnosis of pop artery an  The various methods of treatment have been discussed with the patient and family. After consideration of risks, benefits and other options for treatment, the patient has consented to  Procedure(s): ABDOMINAL AORTOGRAM W/LOWER EXTREMITY (N/A) as a surgical intervention .  The patient's history has been reviewed, patient examined, no change in status, stable for surgery.  I have reviewed the patient's chart and labs.  Questions were answered to the patient's satisfaction.     Annamarie Major

## 2018-05-19 ENCOUNTER — Telehealth: Payer: Self-pay

## 2018-05-19 NOTE — Telephone Encounter (Addendum)
-----  Message from Ilona Sorrel sent at 05/13/2018 12:08 PM EST ----- Regarding: Chest CT You put in order on 6/29 for pt to have CT Chest without 06/2018.  You put in order on 9/24 for pt to have CT Chest High Res 09/2018.  Which CT do you want pt to have?  MR please advise.  Judeen Hammans

## 2018-05-20 NOTE — Telephone Encounter (Signed)
Called patient unable to reach left message to give [place] a call back.   Patient is to have 09/2018 HRCT. Will route to pcc

## 2018-05-20 NOTE — Telephone Encounter (Signed)
No need for pt to return call.  Message was sent to MR to see which CT needed to be scheduled.  I have canceled order for 06/2018 CT & he will be called in 08/2018 to schedule CT for 09/2018 CT.

## 2018-05-20 NOTE — Telephone Encounter (Signed)
Sorry. March 2020 HRCT is fine. Pls ensure followup

## 2018-06-11 ENCOUNTER — Other Ambulatory Visit: Payer: Self-pay | Admitting: Cardiovascular Disease

## 2018-06-11 MED ORDER — SIMVASTATIN 20 MG PO TABS: ORAL_TABLET | ORAL | 1 refills | Status: DC

## 2018-06-11 MED ORDER — VALSARTAN-HYDROCHLOROTHIAZIDE 80-12.5 MG PO TABS: 1.0000 | ORAL_TABLET | Freq: Every day | ORAL | 1 refills | Status: DC

## 2018-06-11 NOTE — Telephone Encounter (Signed)
Patient called to request refills on Valsartan-HCT and simvastatin.Refills submitted [place].

## 2018-06-15 ENCOUNTER — Other Ambulatory Visit: Payer: Self-pay | Admitting: Cardiovascular Disease

## 2018-06-15 MED ORDER — RIVAROXABAN 20 MG PO TABS: 20.0000 mg | ORAL_TABLET | Freq: Every morning | ORAL | 0 refills | Status: DC

## 2018-09-07 ENCOUNTER — Other Ambulatory Visit: Payer: Self-pay

## 2018-09-07 DIAGNOSIS — I724 Aneurysm of artery of lower extremity: Secondary | ICD-10-CM

## 2018-09-07 DIAGNOSIS — Z48812 Encounter for surgical aftercare following surgery on the circulatory system: Secondary | ICD-10-CM

## 2018-09-16 ENCOUNTER — Telehealth: Payer: Self-pay | Admitting: Internal Medicine

## 2018-09-16 MED ORDER — PANTOPRAZOLE SODIUM 40 MG PO TBEC: DELAYED_RELEASE_TABLET | ORAL | 1 refills | Status: DC

## 2018-09-16 MED ORDER — FAMOTIDINE 20 MG PO TABS: ORAL_TABLET | ORAL | 1 refills | Status: DC

## 2018-09-16 NOTE — Telephone Encounter (Signed)
Patient last seen 9.24.19, recs to follow up in 6 months w/ PFT Patient is scheduled to see MR 3.31.2020 with PFT prior Refills sent Patient aware  Nothing further needed; will sign off

## 2018-09-17 ENCOUNTER — Other Ambulatory Visit: Payer: Self-pay

## 2018-09-17 ENCOUNTER — Ambulatory Visit (INDEPENDENT_AMBULATORY_CARE_PROVIDER_SITE_OTHER)
Admission: RE | Admit: 2018-09-17 | Discharge: 2018-09-17 | Disposition: A | Discharge status: Home / Self Care | Payer: Medicare Other | Source: Ambulatory Visit | Attending: Internal Medicine | Admitting: Internal Medicine

## 2018-09-17 DIAGNOSIS — J84112 Idiopathic pulmonary fibrosis: Secondary | ICD-10-CM

## 2018-09-18 ENCOUNTER — Other Ambulatory Visit: Payer: Self-pay

## 2018-09-18 DIAGNOSIS — I724 Aneurysm of artery of lower extremity: Secondary | ICD-10-CM

## 2018-09-21 ENCOUNTER — Telehealth: Payer: Self-pay | Admitting: Internal Medicine

## 2018-09-21 NOTE — Telephone Encounter (Signed)
Primary Pulmonologist: Ramaswamy  Last office visit and with whom: 03/31/18 w/Ramaswamy  What do we see them for (pulmonary problems): IPF Last OV assessment: IPF (idiopathic pulmonary fibrosis) ([place])  - slightly worse since last visit v stable - continue esbriet 3 times daily with meals - in 6 months do spirometry/dlco - repeat HRCT in 6 months - flu shot deferred 03/31/2018  Therapeutic drug monitoring Itching  - new issue  - itching could be dry skin - eat flaxseed daily - apply vaseline twice daily to body  - check LFT 03/31/2018   Solitary pulmonary nodule 7m left lower lobe - June 2019 - repeat HRCT Feb/March 2020  Hyperlipidemia, unspecified hyperlipidemia type  - please talk to Dr TEllouise Newerto see if you still need niaspan  Plan: Followup  6 months ILD clinic but after CT and PFT  Was appointment offered to patient (explain)?  Patient currently has an appt scheduled with MR on 10/06/18 with a PFT. Wants to know if he can have something called in first before being seen sooner.    Reason for call: Spoke with patient. He stated that he has been experiencing increased SOB despite using 3L of O2. Denied any fevers, chills, body aches or cough. Did state he felt some increased pressure in his chest.   Pharmacy is [facility]   TP, please advise. Thanks!

## 2018-09-21 NOTE — Telephone Encounter (Signed)
Denies chest pain , orthopnea , or edema   DOE getting worse for last 2 years. Worse when uses Portable tank on demand o2  Does not have oximetry  No dyspnea at rest   No increased cough or congestion , no fever or travel .    rec : OV if not improving or worse   Recommend home pulse ox from pharmacy to monitor home oxygen level, call if <88%  Use continuous flow oxygen 2-3 L/m when walking   Please contact office for sooner follow up if symptoms do not improve or worsen or seek emergency care

## 2018-09-30 ENCOUNTER — Telehealth: Payer: Self-pay | Admitting: Cardiovascular Disease

## 2018-09-30 NOTE — Telephone Encounter (Signed)
Pt advised that we are putting 2 weeks worth of samples at the front desk for him at the [facility] office... will send to Dr. Claiborne Billings for advice... pt says he did not qualify for patient assistance and cannot afford the RX after the samples... 90.00 for a month supply.

## 2018-09-30 NOTE — Telephone Encounter (Signed)
Any way to advise on the options other than patient assistance for this patient??  Per Dr.Kelly he wanted to check with PharmD.

## 2018-09-30 NOTE — Telephone Encounter (Signed)
  Patient calling the office for samples of medication:   1.  What medication and dosage are you requesting samples for? rivaroxaban (XARELTO) 20 MG TABS tablet  2.  Are you currently out of this medication? Patient has enough for two more days

## 2018-10-01 NOTE — Telephone Encounter (Signed)
$  90 every month of until deductible met?   Please check with insurance. Eliquis pr Pradaxa may be lower Tier.   Due to COVID-19 we are not getting samples in. Is he able to afford a partial prescription until we solve this issue?

## 2018-10-01 NOTE — Telephone Encounter (Signed)
Patient is picking up 2 weeks worth from [place] office, but will call insurance.

## 2018-10-01 NOTE — Telephone Encounter (Signed)
Called patient, he will contact insurance and call us back to let us know what is the cheaper option.   Per patient he has met his deductible

## 2018-10-06 ENCOUNTER — Ambulatory Visit: Payer: Medicare Other | Admitting: Internal Medicine

## 2018-10-14 ENCOUNTER — Telehealth: Payer: Self-pay | Admitting: Cardiovascular Disease

## 2018-10-14 NOTE — Telephone Encounter (Signed)
  Patient calling the office for samples of medication:   1.  What medication and dosage are you requesting samples for? rivaroxaban (XARELTO) 20 MG TABS tablet  2.  Are you currently out of this medication?  Patient has two days left.   Patient would like to discuss with nurse the need to get a prescription.

## 2018-10-14 NOTE — Telephone Encounter (Signed)
Pt calling for samples. Dose is appropriate based on renal function. Will route to samples department to address

## 2018-10-14 NOTE — Telephone Encounter (Signed)
I'm not [address] refills

## 2018-10-15 MED ORDER — RIVAROXABAN 20 MG PO TABS: 20.0000 mg | ORAL_TABLET | Freq: Every morning | ORAL | 12 refills | Status: DC

## 2018-10-15 NOTE — Telephone Encounter (Signed)
Patient notified no samples of Xarelto available. He requests that 15 day supply get sent to his pharmacy with refills.

## 2018-10-15 NOTE — Telephone Encounter (Signed)
Pt called and said he went to get his rx and said his copay was $200 and wanted to know if there was a coupon you could send him

## 2018-10-15 NOTE — Telephone Encounter (Signed)
This is Dr. Evette Georges pt.

## 2018-10-16 ENCOUNTER — Other Ambulatory Visit: Payer: Self-pay | Admitting: Cardiovascular Disease

## 2018-10-16 NOTE — Telephone Encounter (Signed)
Per pt cannot afford Xarelto pt price out of pocket is $257.00 per month Pt states applied previously for assistance and did not qualify Pt is willing to change to Warfarin Will forward to cc.for review./cy

## 2018-10-16 NOTE — Telephone Encounter (Signed)
LMOM for patient - will try again later today

## 2018-10-16 NOTE — Telephone Encounter (Signed)
New Message:    Pt says he is out of Xarelto and he can not get any samples. He would like something else that is not that expensive.

## 2018-10-19 ENCOUNTER — Other Ambulatory Visit: Payer: Self-pay | Admitting: Pharmacist Clinician (PhC)/ Clinical Pharmacy Specialist

## 2018-10-19 MED ORDER — WARFARIN SODIUM 5 MG PO TABS: ORAL_TABLET | ORAL | 0 refills | Status: DC

## 2018-10-19 NOTE — Telephone Encounter (Signed)
Spoke with patient, he was previously on warfarin years ago, cannot recall dose.  Will start him with 5 mg daily and have him contact his PCP for follow up. (he lives in [place]).  If PCP unable to follow patient will call , was given [facility] phone number.  Ran out of Xarelto on Friday per patient.

## 2018-10-22 ENCOUNTER — Telehealth: Payer: Self-pay | Admitting: Cardiovascular Disease

## 2018-10-22 ENCOUNTER — Telehealth: Payer: Self-pay | Admitting: Pharmacist Clinician (PhC)/ Clinical Pharmacy Specialist

## 2018-10-22 NOTE — Telephone Encounter (Signed)
New coumadin appointment set for Tuesday April 21 at 2:30 pm

## 2018-10-22 NOTE — Telephone Encounter (Signed)
Pt also asked when he needs to have INR Check since being on Coumadin. I told pt I would send message to PharmD to advise on this and I will call him back.  Pt stated Kristin sent in Coumadin Rx, so routing to her to advise.

## 2018-10-22 NOTE — Telephone Encounter (Signed)
Virtual Visit Pre-Appointment Phone Call  Steps For Call:  1. Confirm consent - "In the setting of the current Covid19 crisis, you are scheduled for a (phone or video) visit with your provider on (date) at (time).  Just as we do with many in-office visits, in order for you to participate in this visit, we must obtain consent.  If you'd like, I can send this to your mychart (if signed up) or email for you to review.  Otherwise, I can obtain your verbal consent now.  All virtual visits are billed to your insurance company just like a normal visit would be.  By agreeing to a virtual visit, we'd like you to understand that the technology does not allow for your provider to perform an examination, and thus may limit your provider's ability to fully assess your condition.  Finally, though the technology is pretty good, we cannot assure that it will always work on either your or our end, and in the setting of a video visit, we may have to convert it to a phone-only visit.  In either situation, we cannot ensure that we have a secure connection.  Are you willing to proceed?" STAFF: Did the patient verbally acknowledge consent to telehealth visit? Document YES/NO here: YES  2. Confirm the BEST phone number to call the day of the visit by including in appointment notes  3. Give patient instructions for WebEx/MyChart download to smartphone as below or Doximity/Doxy.me if video visit (depending on what platform provider is using)  4. Advise patient to be prepared with their blood pressure, heart rate, weight, any heart rhythm information, their current medicines, and a piece of paper and pen handy for any instructions they may receive the day of their visit  5. Inform patient they will receive a phone call 15 minutes prior to their appointment time (may be from unknown caller ID) so they should be prepared to answer  6. Confirm that appointment type is correct in Epic appointment notes (VIDEO vs PHONE)      TELEPHONE CALL NOTE  Troy Miller has been deemed a candidate for a follow-up tele-health visit to limit community exposure during the Covid-19 pandemic. I spoke with the patient via phone to ensure availability of phone/video source, confirm preferred email & phone number, and discuss instructions and expectations.  I reminded Troy Miller to be prepared with any vital sign and/or heart rhythm information that could potentially be obtained via home monitoring, at the time of his visit. I reminded Troy Miller to expect a phone call at the time of his visit if his visit.  Therisa Doyne 10/22/2018 2:08 PM  Pt does not have smartphone or access to a computer, so no MyChart. Pt verbalized consent to telephone visit on 4/20 with Dr. Claiborne Billings at 8:40 AM. Pt stated he does have a BP cuff, but no weight scale.     FULL LENGTH CONSENT FOR TELE-HEALTH VISIT   I hereby voluntarily request, consent and authorize [facility] and its employed or contracted physicians, physician assistants, nurse practitioners or other licensed health care professionals (the Practitioner), to provide me with telemedicine health care services (the "Services") as deemed necessary by the treating Practitioner. I acknowledge and consent to receive the Services by the Practitioner via telemedicine. I understand that the telemedicine visit will involve communicating with the Practitioner through live audiovisual communication technology and the disclosure of certain medical information by electronic transmission. I acknowledge that I have been given  the opportunity to request an in-person assessment or other available alternative prior to the telemedicine visit and am voluntarily participating in the telemedicine visit.  I understand that I have the right to withhold or withdraw my consent to the use of telemedicine in the course of my care at any time, without affecting my right to future care or treatment, and that  the Practitioner or I may terminate the telemedicine visit at any time. I understand that I have the right to inspect all information obtained and/or recorded in the course of the telemedicine visit and may receive copies of available information for a reasonable fee.  I understand that some of the potential risks of receiving the Services via telemedicine include:  Marland Kitchen Delay or interruption in medical evaluation due to technological equipment failure or disruption; . Information transmitted may not be sufficient (e.g. poor resolution of images) to allow for appropriate medical decision making by the Practitioner; and/or  . In rare instances, security protocols could fail, causing a breach of personal health information.  Furthermore, I acknowledge that it is my responsibility to provide information about my medical history, conditions and care that is complete and accurate to the best of my ability. I acknowledge that Practitioner's advice, recommendations, and/or decision may be based on factors not within their control, such as incomplete or inaccurate data provided by me or distortions of diagnostic images or specimens that may result from electronic transmissions. I understand that the practice of medicine is not an exact science and that Practitioner makes no warranties or guarantees regarding treatment outcomes. I acknowledge that I will receive a copy of this consent concurrently upon execution via email to the email address I last provided but may also request a printed copy by calling the office of [facility].    I understand that my insurance will be billed for this visit.   I have read or had this consent read to me. . I understand the contents of this consent, which adequately explains the benefits and risks of the Services being provided via telemedicine.  . I have been provided ample opportunity to ask questions regarding this consent and the Services and have had my questions answered to  my satisfaction. . I give my informed consent for the services to be provided through the use of telemedicine in my medical care  By participating in this telemedicine visit I agree to the above.

## 2018-10-22 NOTE — Telephone Encounter (Signed)
Patient was unable to afford Xarelto, switched to warfarin 3 days ago.  He was to arrange with PCP in [place] to follow his readings, but is unable to get much info from that office, probably due to [place].  He is agreeable to drive to [place] for now, so will have him go to [facility] office Tuesday at 2:30 for new coumadin appointment.    Patient voiced understanding.

## 2018-10-23 ENCOUNTER — Telehealth: Payer: Self-pay | Admitting: Cardiovascular Disease

## 2018-10-23 NOTE — Telephone Encounter (Signed)
Home phone/virtual consent/ declined my chart/ pre reg completed

## 2018-10-26 ENCOUNTER — Telehealth (INDEPENDENT_AMBULATORY_CARE_PROVIDER_SITE_OTHER): Payer: Medicare Other | Admitting: Cardiovascular Disease

## 2018-10-26 DIAGNOSIS — E782 Mixed hyperlipidemia: Secondary | ICD-10-CM

## 2018-10-26 DIAGNOSIS — I714 Abdominal aortic aneurysm, without rupture, unspecified: Secondary | ICD-10-CM

## 2018-10-26 DIAGNOSIS — Z951 Presence of aortocoronary bypass graft: Secondary | ICD-10-CM

## 2018-10-26 DIAGNOSIS — I724 Aneurysm of artery of lower extremity: Secondary | ICD-10-CM

## 2018-10-26 DIAGNOSIS — G4733 Obstructive sleep apnea (adult) (pediatric): Secondary | ICD-10-CM | POA: Diagnosis not present

## 2018-10-26 DIAGNOSIS — Z7901 Long term (current) use of anticoagulants: Secondary | ICD-10-CM

## 2018-10-26 DIAGNOSIS — I10 Essential (primary) hypertension: Secondary | ICD-10-CM

## 2018-10-26 DIAGNOSIS — J84112 Idiopathic pulmonary fibrosis: Secondary | ICD-10-CM

## 2018-10-26 DIAGNOSIS — I2510 Atherosclerotic heart disease of native coronary artery without angina pectoris: Secondary | ICD-10-CM | POA: Diagnosis not present

## 2018-10-26 DIAGNOSIS — I350 Nonrheumatic aortic (valve) stenosis: Secondary | ICD-10-CM | POA: Diagnosis not present

## 2018-10-26 DIAGNOSIS — I739 Peripheral vascular disease, unspecified: Secondary | ICD-10-CM

## 2018-10-26 NOTE — Progress Notes (Signed)
Virtual Visit via Telephone Note   This visit type was conducted due to national recommendations for restrictions regarding the COVID-19 Pandemic (e.g. social distancing) in an effort to limit this patient's exposure and mitigate transmission in our community.  Due to his co-morbid illnesses, this patient is at least at moderate risk for complications without adequate follow up.  This format is felt to be most appropriate for this patient at this time.  The patient did not have access to video technology/had technical difficulties with video requiring transitioning to audio format only (telephone).  All issues noted in this document were discussed and addressed.  No physical exam could be performed with this format.  Please refer to the patient's chart for his  consent to telehealth for [facility].   Evaluation Performed:  Follow-up visit  Date:  10/26/2018   ID:  Troy Miller, DOB 06/23/1941, MRN [id number]  Patient Location: Home Provider Location: Home  PCP:  [name], PA-C  Cardiologist:  Shelva Majestic, MD  Electrophysiologist:  None   Chief Complaint:  7 month f/u cardiology evaluation  History of Present Illness:    Troy Miller is a 78 year old Caucasian male who has CAD dating back to 1994 when he underwent his initial CABG surgery. He is status post redo bypass surgery in 2005.  He has a history of hypertension and mixed hyperlipidemia.  He has been on valsartan HCT and also has been on simvastatin with niacin.  He has peripheral vascular disease. He has mild aortic stenosis with low-normal EF on his prior echo.  His last nuclear study revealed inferior scar without associated ischemia. 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  He is status post remote right fem-tib bypass graft surgery in 1996. He is  followed  by Dr. Trula Slade.   He  developed complaints of chest discomfort in 2016.  A nuclear perfusion study was  on 02/10/2015:  Ejection fraction was 47%. There was evidence for prior inferior perfusion defect suggesting prior infarction.  There was no ischemia.  [name] has a history of obstructive sleep apnea dating back to 2005.  His initial sleep study showed severe sleep apnea with an AHI of 33.9, and in addition to obstructive events had central events. Initial O2 desaturation was 81%. He underwent a new sleep evaluaton 12/22/2015.  This confirmed moderate sleep apnea with an AHI overall of 26 per hour.  However, sleep apnea was severe during [name] sleep with an AHI 51.4.  He had significant oxygen desaturation to 82% in time below 88% was 76.4 minutes.  There was moderate snoring.  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  He received  a new CPAP machine.  He admits to 100% compliance.  His sleep has improved.  He denies daytime sleepiness and is unaware of breakthrough snoring.  I saw him in September 2018 and was also being seen by the [place].Marland Kitchen  Remotely he developed cough secondary to ace inhibition.  He had Dr. 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  Occasionally he require supplemental oxygen if he is walking but typically does not use oxygen routinely.   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 He still experiences shortness of breath but perhaps is a slightly improved.  An echo Doppler study from December 26, 2017 showed an EF of 35 to 40% with to mid inferolateral inferior inferoseptal mild hypokinesis.  There was grade 1 diastolic dysfunction.  There was mild aortic stenosis with a mean gradient of 21 and peak gradient of 31.  The ascending aorta was felt to be mildly dilated.  His RV was mildly dilated.  He had mild  increased PA pressure at 34 mmHg. He underwent abdominal aortic Doppler to assess his abdominal aortic aneurysm on January 16, 2018  which showed the largest diameter 3.7 cm, essentially unchanged from prior exams.  Lower extremity duplex showed normal staying ABIs at his right toe brachial index was abnormal as well as his left toe brachial index.  His LFTs have been normal.   Since I last saw him, he underwent lower extremity PV angiography and intervention by Dr. Trula Slade on May 05, 2018 which demonstrated aneurysmal degeneration of the infrarenal abdominal aorta and bilateral iliac arteries. The right common femoral profundofemoral artery were heavily calcified but patent.  The SFA was occluded.  A bypass graft originating from the SFA had a proximal stenosis and luminal irregularity at the anastomosis.Runoff was to the peroneal artery.  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  He underwent stent to the right SFA and a stent to the left popliteal artery.  Presently, he believes his blood pressure has been controlled.  He continues to be on valsartan/HCTZ 80/milligrams.Recent lipid studies were excellent on his current regimen with simvastatin and niacin with total cholesterol 109 triglycerides 106 HDL 45 LDL 43.  He had been on Xarelto 20 mg and has a remote history of atrial fibrillation ablation.  He has been maintaining sinus rhythm.  Unfortunately the cost of Xarelto was too much and he was having to pay $266 per month out of pocket.  Last week he had called the office and was to warfarin.  He is scheduled to undergo an INR check tomorrow.  He presents for evaluation.    The patient does not have symptoms concerning for COVID-19 infection (fever, chills, cough, or new shortness of breath).    Past Medical History:  Diagnosis Date  . AAA (abdominal aortic aneurysm) ([place])   . Aortic stenosis, mild    07/10/12 EF 50-55%  on echo-mitral annular ca+ also  . Atrial fibrillation ([place])    03/19/10 ablation-[name]  . Atrial flutter ([place])   . CAD (coronary artery disease)   . DVT (deep venous thrombosis) ([place])   . History of stress test    Myoview 8/16:  EF 47%, inferior scar, no ischemia, Intermediate risk (low EF)  . Hyperlipemia   . Hypertension   . Iliac artery aneurysm ([place])   . PVD (peripheral vascular disease) ([place])    remote right fem-tib bypass graft sugery  . S/P CABG (coronary artery bypass graft) 1994 & 2005   Past Surgical History:  Procedure Laterality Date  . ABDOMINAL AORTOGRAM W/LOWER EXTREMITY N/A 02/18/2017   Procedure: ABDOMINAL AORTOGRAM W/LOWER EXTREMITY;  Surgeon: Serafina Mitchell, MD;  Location: [place] [facility];  Service: Cardiovascular;  Laterality: N/A;  . ABDOMINAL AORTOGRAM W/LOWER EXTREMITY N/A 05/05/2018   Procedure: ABDOMINAL AORTOGRAM W/LOWER EXTREMITY;  Surgeon: Serafina Mitchell, MD;  Location: [place] [facility];  Service: Cardiovascular;  Laterality: N/A;  . BACK SURGERY  2003  .  CARDIAC ELECTROPHYSIOLOGY STUDY AND ABLATION  03/19/10  . CARDIOVERSION  08/16/08  . CHOLECYSTECTOMY  08/13/02  . CORONARY ARTERY BYPASS GRAFT  1994 & 2005  . FEMORAL-TIBIAL BYPASS GRAFT     Remote  . HERNIA REPAIR  06/03/07  . PERIPHERAL VASCULAR INTERVENTION Left 02/18/2017   Procedure: PERIPHERAL VASCULAR INTERVENTION;  Surgeon: Serafina Mitchell, MD;  Location: [place] [facility];  Service: Cardiovascular;  Laterality: Left;  POPLITEAL  . PERIPHERAL VASCULAR INTERVENTION Bilateral 05/05/2018   Procedure: PERIPHERAL VASCULAR INTERVENTION;  Surgeon: Serafina Mitchell, MD;  Location: [place] [facility];  Service: Cardiovascular;  Laterality: Bilateral;  Rt fem/pop graft and Lt popliteal  . VASECTOMY  01/28/08     Current Meds  Medication Sig  . acetaminophen (TYLENOL) 500 MG tablet Take 1,000 mg by mouth every 6 (six) hours as needed for moderate pain or headache.   Marland Kitchen aspirin 81 MG  tablet Take 81 mg by mouth daily.  . calcium carbonate (OS-CAL) 600 MG TABS Take 600 mg by mouth 2 (two) times daily with a meal.  . Cholecalciferol (VITAMIN D) 2000 units CAPS Take 2,000 Units by mouth daily.  . famotidine (PEPCID) 20 MG tablet TAKE 1 TABLET BY MOUTH AT BEDTIME  . folic acid (FOLVITE) 1 MG tablet Take 1 mg by mouth every evening.   . niacin (NIASPAN) 500 MG CR tablet Take 2,000 mg by mouth every evening.   Marland Kitchen oxyCODONE-acetaminophen (PERCOCET) 10-325 MG tablet Take 1 tablet by mouth 3 (three) times daily as needed for severe pain.  . pantoprazole (PROTONIX) 40 MG tablet TAKE 1 TABLET BY MOUTH ONCE DAILY 30 TO 60 MINUTES BEFORE FIRST MEAL OF THE DAY  . Pirfenidone (ESBRIET) 267 MG TABS Take 3 tablets (801 mg total) by mouth 3 (three) times daily.  . simvastatin (ZOCOR) 20 MG tablet TAKE 1 TABLET BY MOUTH EVERY DAY IN THE EVENING  . valsartan-hydrochlorothiazide (DIOVAN-HCT) 80-12.5 MG tablet Take 1 tablet by mouth daily.  Marland Kitchen warfarin (COUMADIN) 5 MG tablet Take 1 tablet by mouth daily or as directed by PCP     Allergies:   Fentanyl and related   Social History   Tobacco Use  . Smoking status: Former Smoker    Packs/day: 2.00    Years: 25.00    Pack years: 50.00    Types: Cigarettes    Last attempt to quit: 04/26/1986    Years since quitting: 32.5  . Smokeless tobacco: Never Used  Substance Use Topics  . Alcohol use: No    Alcohol/week: 0.0 standard drinks  . Drug use: No     Family Hx: The patient's family history includes Heart failure in his father; Lung cancer in his mother; Stroke in his sister.  ROS:   Please see the history of present illness.    He denies any recurrent anginal symptoms.  He denies PND orthopnea.  He is unaware of recurrent atrial fibrillation.  He denies presyncope or syncope.  He does note some occasional shortness of breath and uses supplemental oxygen intermittently.  He admits to 100% compliance with his CPAP therapy.  Following his  recent lower extremity peripheral vascular intervention, claudication symptoms have improved.  He denies recent bleeding.  He denies any change in bowel or bladder habits.  He denies significant lower extremity edema.  He denies any neurologic symptoms All other systems reviewed and are negative.   Prior CV studies:   The following studies were reviewed today:  ------------------------------------------------------------------- Study Conclusions  -  Left ventricle: The cavity size was moderately dilated. Systolic   function was moderately reduced. The estimated ejection fraction   was in the range of 35% to 40%. Hypokinesis of the   basal-midinferolateral, inferior, and inferoseptal myocardium.   Doppler parameters are consistent with abnormal left ventricular   relaxation (grade 1 diastolic dysfunction). Doppler parameters   are consistent with indeterminate ventricular filling pressure. - Aortic valve: Valve mobility was restricted. There was moderate   stenosis. There was no regurgitation. Peak velocity (S): 279   cm/s. Mean gradient (S): 21 mm Hg. Peak gradient (S): 31 mm Hg. - Aorta: Ascending aortic diameter: 37 mm (S). - Ascending aorta: The ascending aorta was mildly dilated. - Mitral valve: Calcified annulus. Transvalvular velocity was   within the normal range. There was no evidence for stenosis.   There was trivial regurgitation. - Left atrium: The atrium was mildly dilated. - Right ventricle: The cavity size was mildly dilated. Wall   thickness was normal. Systolic function was normal. - Atrial septum: No defect or patent foramen ovale was identified   by color flow Doppler. - Tricuspid valve: There was trivial regurgitation. - Pulmonary arteries: Systolic pressure was within the normal   range. PA peak pressure: 34 mm Hg (S). - Global longitudinal strain -14.4% (abnormal).  I have also reviewed the PV angiogram and interventional study performed by Dr. Trula Slade in  October 2019.  Labs/Other Tests and Data Reviewed:    EKG:  An ECG dated 04/06/2018 was personally reviewed today and demonstrated:   Sinus rhythm with sinus arrhythmia, isolated PVC.  Left anterior hemiblock.  Early transition with prominent R wave in V1 and V2  Recent Labs: 04/10/2018: ALT 6; TSH 2.870 05/05/2018: BUN 25; Creatinine, Ser 1.50; Hemoglobin 15.0; Potassium 3.9; Sodium 140   Recent Lipid Panel Lab Results  Component Value Date/Time   CHOL 109 04/10/2018 08:27 AM   CHOL 118 05/05/2013 08:39 AM   TRIG 106 04/10/2018 08:27 AM   TRIG 202 (H) 05/05/2013 08:39 AM   HDL 45 04/10/2018 08:27 AM   HDL 38 (L) 05/05/2013 08:39 AM   CHOLHDL 2.4 04/10/2018 08:27 AM   CHOLHDL 2.6 05/20/2014 08:23 AM   LDLCALC 43 04/10/2018 08:27 AM   LDLCALC 40 05/05/2013 08:39 AM    Wt Readings from Last 3 Encounters:  05/05/18 186 lb (84.4 kg)  04/27/18 181 lb (82.1 kg)  04/13/18 186 lb 3.2 oz (84.5 kg)     Objective:    Vital Signs: By patient report, his blood pressure was 102/53.  Pulse was 77.  There was no change in weight  He denies any significant change in his exam.  Respirations appeared normal.  He was not short of breath.  There was no audible wheezing.  He was unaware of any leg swelling or neck vein distention.  He denied any chest discomfort to palpation.  He denied any abdominal discomfort to palpation.  He did note improved circulation to his lower extremity with improvement in prior claudication  ASSESSMENT & PLAN:    1. CAD/status post CABG revascularization surgery initially in 1994 with redo in 2005 with a RIMA to LAD, vein graft to distal RCA by [name].  The previous LIMA graft was preserved and supplied the OM1 vessel.  Currently no significant anginal symptomatology on current regimen. 2. History of PAF and atrial flutter, status post ablation by [name].  He admits that his heart rate is regular without palpitations.  His last ECG demonstrated sinus  rhythm. 3.  Essential hypertension: Current blood pressure excellent on valsartan 80/12.5 mg.  Plan continue current therapy. 4. Mixed hyperlipidemia: Current lipid studies reviewed.  Continue current therapy.  In the past we discussed possible discontinuance of niacin but he has continued to do well on current treatment laboratory. 5. PVD: Remote right fem tibial bypass graft surgery.  Recent PVD angiogram and intervention from October 2019 reviewed.  Status post new right SFA stent and left popliteal stent. 6. Interstitial pulmonary fibrosis: Followed by Dr. Chase Caller.  Symptoms slightly improved with Esbriet. 7. Obstructive sleep apnea: Currently on CPAP therapy with 100% compliance.  Intermittently uses oxygen supplementation 8. Anticoagulation: Last week was switched from Xarelto back to warfarin.  Plan for INR check tomorrow with drive-through checking due to [place] pandemic. 9. GERD: Controlled with Pepcid 10. Mild to moderate aortic stenosis with a mean gradient of 17 and peak gradient of 34 mmHg on December 06, 2016.  EF 40 to 45%.  Plan to repeat echo in 6 months for an 81-monthfollow-up evaluation with plans for follow-up office visit  COVID-19 Education: The signs and symptoms of COVID-19 were discussed with the patient and how to seek care for testing (follow up with PCP or arrange E-visit).  The importance of social distancing was discussed today.  Time:   Today, I have spent 26 minutes with the patient with telehealth technology discussing the above problems.     Medication Adjustments/Labs and Tests Ordered: Current medicines are reviewed at length with the patient today.  Concerns regarding medicines are outlined above.   Tests Ordered: No orders of the defined types were placed in this encounter.   Medication Changes: No orders of the defined types were placed in this encounter.   Disposition:  Follow up echo in 6 month and ov  Signed, TShelva Majestic MD  10/26/2018 8:40 AM    [place]

## 2018-10-26 NOTE — Patient Instructions (Addendum)
Medication Instructions:  The current medical regimen is effective;  continue present plan and medications.  If you need a refill on your cardiac medications before your next appointment, please call your pharmacy.   Testing/Procedures: Echocardiogram (6 months) - Your physician has requested that you have an echocardiogram. Echocardiography is a painless test that uses sound waves to create images of your heart. It provides your doctor with information about the size and shape of your heart and how well your heart's chambers and valves are working. This procedure takes approximately one hour. There are no restrictions for this procedure. This will be performed at our [facility] location - [address], [address].   Follow-Up: At [facility], you and your health needs are our priority.  As part of our continuing mission to provide you with exceptional heart care, we have created designated Provider Care Teams.  These Care Teams include your primary Cardiologist (physician) and Advanced Practice Providers (APPs -  Physician Assistants and Nurse Practitioners) who all work together to provide you with the care you need, when you need it. You will need a follow up appointment in 6 months (after ECHO). You may see Shelva Majestic, MD or one of the following Advanced Practice Providers on your designated Care Team: [place], [place] . Fabian Sharp, PA-C

## 2018-10-26 NOTE — Addendum Note (Signed)
Addended by: Caprice Beaver T on: 10/26/2018 12:04 PM   Modules accepted: Orders

## 2018-10-27 ENCOUNTER — Other Ambulatory Visit: Payer: Self-pay

## 2018-10-27 ENCOUNTER — Ambulatory Visit (INDEPENDENT_AMBULATORY_CARE_PROVIDER_SITE_OTHER): Payer: Medicare Other | Admitting: Pharmacist Clinician (PhC)/ Clinical Pharmacy Specialist

## 2018-10-27 DIAGNOSIS — Z7901 Long term (current) use of anticoagulants: Secondary | ICD-10-CM

## 2018-10-27 DIAGNOSIS — I4892 Unspecified atrial flutter: Secondary | ICD-10-CM | POA: Diagnosis not present

## 2018-10-27 LAB — POCT INR: INR: 1.5 — AB (ref 2.0–3.0)

## 2018-10-28 ENCOUNTER — Telehealth: Payer: Self-pay | Admitting: Pharmacist Clinician (PhC)/ Clinical Pharmacy Specialist

## 2018-10-28 NOTE — Telephone Encounter (Signed)
Call back from Harriston Britt's office  Warfarin indication, recent INR results, current warfarin dose, INR goal, and date for next INR check provided.  They are to call patient and schedule next INR check.

## 2018-10-28 NOTE — Telephone Encounter (Signed)
Patient was previously on Xarelto, has been unable to afford or get samples.  Switched to warfarin on April 13.  Patient lives in [place] and would like to be followed by PCP [name] PA.  He states that he tried calling her office, but has been unable to get a response.    Called office after patient was in [place] for new coumadin appointment.  Left message with Inez Catalina, [place] for [name], asking if they have any INR services in [place].  Patient would rather not drive 1 hour each way for coumadin check.    Patient was scheduled for next appointment in 1 week based on today's INR result.  Will come to [employer] drive thru clinic next Wednesday, but is hoping to get response from PCP before then.

## 2018-11-02 ENCOUNTER — Ambulatory Visit: Payer: Medicare Other | Admitting: Surgery

## 2018-11-03 ENCOUNTER — Telehealth: Payer: Self-pay

## 2018-11-03 NOTE — Telephone Encounter (Signed)
lmom for prescreen  

## 2018-11-09 NOTE — Progress Notes (Signed)
  This encounter was created in error - please disregard.

## 2018-11-13 ENCOUNTER — Other Ambulatory Visit: Payer: Self-pay | Admitting: Pharmacist

## 2018-11-13 MED ORDER — WARFARIN SODIUM 5 MG PO TABS: ORAL_TABLET | ORAL | 0 refills | Status: AC

## 2018-11-26 ENCOUNTER — Other Ambulatory Visit: Payer: Self-pay | Admitting: Internal Medicine

## 2018-12-04 ENCOUNTER — Other Ambulatory Visit (HOSPITAL_COMMUNITY): Payer: Medicare Other

## 2018-12-04 ENCOUNTER — Telehealth: Payer: Self-pay | Admitting: Cardiovascular Disease

## 2018-12-04 NOTE — Telephone Encounter (Signed)
LVM for pt to call and schedule echo for 10-20.

## 2018-12-07 ENCOUNTER — Other Ambulatory Visit: Payer: Self-pay

## 2018-12-07 ENCOUNTER — Other Ambulatory Visit (HOSPITAL_COMMUNITY)
Admission: RE | Admit: 2018-12-07 | Discharge: 2018-12-07 | Disposition: A | Discharge status: Home / Self Care | Payer: Medicare Other | Source: Ambulatory Visit | Attending: Internal Medicine | Admitting: Internal Medicine

## 2018-12-07 DIAGNOSIS — Z1159 Encounter for screening for other viral diseases: Secondary | ICD-10-CM | POA: Insufficient documentation

## 2018-12-08 ENCOUNTER — Telehealth: Payer: Self-pay | Admitting: Internal Medicine

## 2018-12-08 NOTE — Telephone Encounter (Signed)
Pt had PFT and appt with TP on 6/4. Results of COVID pending.

## 2018-12-08 NOTE — Telephone Encounter (Signed)
Called patient to make aware results in process.

## 2018-12-09 ENCOUNTER — Other Ambulatory Visit: Payer: Self-pay | Admitting: Internal Medicine

## 2018-12-09 ENCOUNTER — Telehealth: Payer: Self-pay | Admitting: Internal Medicine

## 2018-12-09 DIAGNOSIS — J84112 Idiopathic pulmonary fibrosis: Secondary | ICD-10-CM

## 2018-12-09 LAB — NOVEL CORONAVIRUS, NAA (HOSP ORDER, SEND-OUT TO REF LAB; TAT 18-24 HRS): SARS-CoV-2, NAA: NOT DETECTED

## 2018-12-09 NOTE — Telephone Encounter (Signed)
Pt aware Covid test did not detect virus. Nothing further needed.

## 2018-12-10 ENCOUNTER — Encounter: Payer: Self-pay | Admitting: Adult Health

## 2018-12-10 ENCOUNTER — Ambulatory Visit (INDEPENDENT_AMBULATORY_CARE_PROVIDER_SITE_OTHER): Payer: Medicare Other | Admitting: Adult Health

## 2018-12-10 ENCOUNTER — Other Ambulatory Visit: Payer: Self-pay

## 2018-12-10 ENCOUNTER — Ambulatory Visit (INDEPENDENT_AMBULATORY_CARE_PROVIDER_SITE_OTHER): Payer: Medicare Other | Admitting: Internal Medicine

## 2018-12-10 VITALS — BP 110/70 | HR 64 | Temp 97.8°F | Ht 71.0 in | Wt 181.6 lb

## 2018-12-10 DIAGNOSIS — C3431 Malignant neoplasm of lower lobe, right bronchus or lung: Secondary | ICD-10-CM | POA: Diagnosis not present

## 2018-12-10 DIAGNOSIS — Z5181 Encounter for therapeutic drug level monitoring: Secondary | ICD-10-CM

## 2018-12-10 DIAGNOSIS — J84112 Idiopathic pulmonary fibrosis: Secondary | ICD-10-CM | POA: Diagnosis not present

## 2018-12-10 DIAGNOSIS — J9611 Chronic respiratory failure with hypoxia: Secondary | ICD-10-CM

## 2018-12-10 DIAGNOSIS — I2510 Atherosclerotic heart disease of native coronary artery without angina pectoris: Secondary | ICD-10-CM | POA: Diagnosis not present

## 2018-12-10 LAB — PULMONARY FUNCTION TEST
DL/VA % pred: 38 %
DL/VA: 1.48 ml/min/mmHg/L
DLCO unc % pred: 33 %
DLCO unc: 8.42 ml/min/mmHg
FEF 25-75 Pre: 3.94 L/sec
FEF2575-%Pred-Pre: 185 %
FEV1-%Pred-Pre: 93 %
FEV1-Pre: 2.83 L
FEV1FVC-%Pred-Pre: 131 %
FEV6-%Pred-Pre: 75 %
FEV6-Pre: 2.99 L
FEV6FVC-%Pred-Pre: 106 %
FVC-%Pred-Pre: 70 %
FVC-Pre: 2.99 L
Pre FEV1/FVC ratio: 94 %
Pre FEV6/FVC Ratio: 100 %

## 2018-12-10 NOTE — Addendum Note (Signed)
Addended by: Suzzanne Cloud E on: 12/10/2018 04:22 PM   Modules accepted: Orders

## 2018-12-10 NOTE — Assessment & Plan Note (Signed)
Continue on O2.  Order for POC sent

## 2018-12-10 NOTE — Progress Notes (Signed)
_0  ID: Troy Miller, male    DOB: 03/02/1941, 78 y.o.   MRN: [id number]  Chief Complaint  Patient presents with  . 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  Patient has underlying IPF.  Says overall breathing is doing about the same.  He does he gets winded with activity.  Has to rest often.  Feels that this is been slowly going downhill over the last 1 to 2 years.  He is on oxygen 2 L.  He says his oxygen tank is too heavy.  He needs a lighter weight tank so he can be more active.  He remains on Esbriet 3 times a day.  Says weight is been steady.  He denies any nausea vomiting diarrhea.  PFTs were done today and appears stable since September 2019.  PFTs today show an FEV1 at 93% ratio 94, FVC 70%, DLCO 33% PFTs are stable since September 2019 when DLCO was at 31% FVC 79%  Patient had a right lower lob  nodule.  PET was positive.  Needle biopsy was positive for squamous cell.  Patient underwent XRT.  He has been having serial CT chest. A follow-up CT chest September 17, 2018 showed stable areas with no evidence of local tumor recurrence or metastatic disease.  Did show evolving radiation fibrosis in this area  TEST /EVENTS  SARS-CoV-2 negative on December 07, 2018 High-resolution CT chest September 17, 2018 showed evolving radiation fibrosis in the right lower lobe without evidence of local tumor recurrence, no findings highly suspicious for metastatic disease.  Mild right hilar adenopathy is stable stable ILD changes mildly progressed since 2018     Allergies  Allergen Reactions  . Fentanyl And Related Other (See Comments)    Hallucinations    Immunization History   Administered Date(s) Administered  . Influenza Split 04/20/2014  . Influenza, High Dose Seasonal PF 05/17/2016, 05/12/2017  . Influenza,inj,Quad PF,6+ Mos 03/10/2015  . Influenza-Unspecified 06/02/2012  . Pneumococcal Conjugate-13 02/23/2016    Past Medical History:  Diagnosis Date  . AAA (abdominal aortic aneurysm) [place]   . Aortic stenosis, mild    07/10/12 EF 50-55% on echo-mitral annular ca+ also  . Atrial fibrillation ([facility] [place]    03/19/10 ablation-Dr. TGeorgiana Shore . Atrial flutter ([facility]   . CAD (coronary artery disease)   . DVT (deep venous thrombosis) ([facility]   . History of stress test    Myoview 8/16:  EF 47%, inferior scar, no ischemia, Intermediate risk (low EF)  . Hyperlipemia   . Hypertension   . Iliac artery aneurysm ([facility]   . PVD (peripheral vascular disease) ([facility]    remote right fem-tib bypass graft sugery  . 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 Packs/day: 2.00  . Years: 25.00  . Pack years: 50.00  . Types: Cigarettes  . Last attempt to quit: 04/26/1986  . Years since quitting: 32.6  Smokeless Tobacco Never Used   Counseling given: Not Answered   Outpatient Medications Prior to Visit  Medication Sig Dispense Refill  . acetaminophen (TYLENOL) 500 MG tablet Take 1,000 mg  by mouth every 6 (six) hours as needed for moderate pain or headache.     Marland Kitchen aspirin 81 MG tablet Take 81 mg by mouth daily.    . calcium carbonate (OS-CAL) 600 MG TABS Take 600 mg by mouth 2 (two) times daily with a meal.    . Cholecalciferol (VITAMIN D) 2000 units CAPS Take 2,000 Units by mouth daily.    . famotidine (PEPCID) 20 MG tablet TAKE 1 TABLET BY MOUTH AT BEDTIME 90 tablet 1  . folic acid (FOLVITE) 1 MG tablet Take 1 mg by mouth every evening.     . niacin (NIASPAN) 500 MG CR tablet Take 2,000 mg by mouth every evening.     Marland Kitchen oxyCODONE-acetaminophen (PERCOCET) 10-325 MG tablet  Take 1 tablet by mouth 3 (three) times daily as needed for severe pain.    . pantoprazole (PROTONIX) 40 MG tablet TAKE 1 TABLET BY MOUTH ONCE DAILY 30 TO 60 MINUTES BEFORE FIRST MEAL OF THE DAY 90 tablet 1  . Pirfenidone (ESBRIET) 267 MG TABS Take 3 tablets (801 mg total) by mouth 3 (three) times daily. 189 tablet 0  . simvastatin (ZOCOR) 20 MG tablet TAKE 1 TABLET BY MOUTH EVERY DAY IN THE EVENING 90 tablet 1  . valsartan-hydrochlorothiazide (DIOVAN-HCT) 80-12.5 MG tablet Take 1 tablet by mouth daily. 90 tablet 1  . warfarin (COUMADIN) 5 MG tablet Take 1 tablet by mouth daily or as directed by PCP 45 tablet 0   No facility-administered medications prior to visit.      Review of Systems:   Constitutional:   No  weight loss, night sweats,  Fevers, chills,  +fatigue, or  lassitude.  HEENT:   No headaches,  Difficulty swallowing,  Tooth/dental problems, or  Sore throat,                No sneezing, itching, ear ache, nasal congestion, post nasal drip,   CV:  No chest pain,  Orthopnea, PND, swelling in lower extremities, anasarca, dizziness, palpitations, syncope.   GI  No heartburn, indigestion, abdominal pain, nausea, vomiting, diarrhea, change in bowel habits, loss of appetite, bloody stools.   Resp:   No chest wall deformity  Skin: no rash or lesions.  GU: no dysuria, change in color of urine, no urgency or frequency.  No flank pain, no hematuria   MS:  No joint pain or swelling.  No decreased range of motion.  No back pain.    Physical Exam  BP 110/70 (BP Location: Right Arm, Patient Position: Sitting, Cuff Size: Normal)   Pulse 64   Temp 97.8 F (36.6 C) (Oral)   Ht _0  (1.803 m)   Wt 181 lb 9.6 oz (82.4 kg)   SpO2 98%   BMI 25.33 kg/m   GEN: A/Ox3; pleasant , NAD, elderly on O2    HEENT:  [place]/AT,  EACs-clear, TMs-wnl, NOSE-clear, THROAT-clear, no lesions, no postnasal drip or exudate noted.   NECK:  Supple w/ fair ROM; no JVD; normal carotid impulses w/o bruits;  no thyromegaly or nodules palpated; no lymphadenopathy.    RESP  BB crackles ,  no accessory muscle use, no dullness to percussion  CARD:  RRR, no m/r/g, no peripheral edema, pulses intact, no cyanosis or clubbing.  GI:   Soft & nt; nml bowel sounds; no organomegaly or masses detected.   Musco: Warm bil, no deformities or joint swelling noted.   Neuro: alert, no focal deficits noted.    Skin: Warm, no lesions or  rashes    Lab Results:  CBC   BNP No results found for: BNP   Imaging: No results found.    PFT Results Latest Ref Rng & Units 12/10/2018 03/31/2018 08/01/2017 12/09/2016 05/03/2016 04/14/2015  FVC-Pre L 2.99 3.35 3.53 3.84 3.70 3.96  FVC-Predicted Pre % 70 79 82 89 85 90  FVC-Post L - - - - 3.92 4.03  FVC-Predicted Post % - - - - 90 92  Pre FEV1/FVC % % 94 85 80 76 75 72  Post FEV1/FCV % % - - - - 73 72  FEV1-Pre L 2.83 2.85 2.83 2.92 2.78 2.86  FEV1-Predicted Pre % 93 94 92 94 89 90  FEV1-Post L - - - - 2.85 2.91  DLCO UNC% % 33 31 32 34 36 40  DLCO COR %Predicted % 38 41 41 41 50 56  TLC L - - - - 5.96 5.72  TLC % Predicted % - - - - 82 78  RV % Predicted % - - - - 77 57    No results found for: NITRICOXIDE      Assessment & Plan:   No problem-specific Assessment & Plan notes found for this encounter.     Rexene Edison, NP 12/10/2018

## 2018-12-10 NOTE — Patient Instructions (Addendum)
IPF (idiopathic pulmonary fibrosis) ([facility]) - continue esbriet 3 times daily with meals -Labs today  Continue on Oxygen 2lm -Order POC   Solitary pulmonary nodule  left lower lobe - stable on CT chest 09/2018     Followup 4 months ILD clinic with Dr. Chase Caller

## 2018-12-10 NOTE — Progress Notes (Signed)
Spirometry and dlco done today. 

## 2018-12-11 LAB — HEPATIC FUNCTION PANEL
ALT: 7 U/L (ref 0–53)
AST: 17 U/L (ref 0–37)
Albumin: 4.4 g/dL (ref 3.5–5.2)
Alkaline Phosphatase: 63 U/L (ref 39–117)
Bilirubin, Direct: 0.1 mg/dL (ref 0.0–0.3)
Total Bilirubin: 0.4 mg/dL (ref 0.2–1.2)
Total Protein: 7.7 g/dL (ref 6.0–8.3)

## 2018-12-11 NOTE — Assessment & Plan Note (Signed)
Continue with serial CT follow-up

## 2018-12-11 NOTE — Assessment & Plan Note (Signed)
IPF with gradual clinical deterioration.  Essentially PFTs today are stable.  CT chest in March did show evolving radiation fibrosis in the area of previous XRT from his right lower lobe lung cancer. No increased oxygen demands.  Would continue Esbriet.  Activity as tolerated.  Plan  Patient Instructions  IPF (idiopathic pulmonary fibrosis) ([place]) - continue esbriet 3 times daily with meals -Labs today  Continue on Oxygen 2lm -Order POC   Solitary pulmonary nodule  left lower lobe - stable on CT chest 09/2018     Followup 4 months ILD clinic with Dr. Chase Caller

## 2018-12-14 ENCOUNTER — Telehealth: Payer: Self-pay | Admitting: Adult Health

## 2018-12-14 DIAGNOSIS — J84112 Idiopathic pulmonary fibrosis: Secondary | ICD-10-CM

## 2018-12-14 NOTE — Telephone Encounter (Signed)
  Returned call to patient to verify info. [facility], [phone] Order for Scott placed.

## 2018-12-15 ENCOUNTER — Ambulatory Visit: Payer: Medicare Other | Admitting: Adult Health

## 2018-12-16 ENCOUNTER — Other Ambulatory Visit: Payer: Self-pay | Admitting: Pharmacist Clinician (PhC)/ Clinical Pharmacy Specialist

## 2018-12-16 MED ORDER — VALSARTAN-HYDROCHLOROTHIAZIDE 80-12.5 MG PO TABS: 1.0000 | ORAL_TABLET | Freq: Every day | ORAL | 1 refills | Status: DC

## 2018-12-23 ENCOUNTER — Telehealth: Payer: Self-pay | Admitting: Adult Health

## 2018-12-23 MED ORDER — ESBRIET 267 MG PO TABS: 3.0000 | ORAL_TABLET | Freq: Three times a day (TID) | ORAL | 0 refills | Status: DC

## 2018-12-23 NOTE — Telephone Encounter (Signed)
Pt had a question about a ongoing rash he has had for a year or so. He is wondering if it could be coming form Esbriet? He was given a cream topical by pcp that worked for a while. They told him they would have to refer him to dermatology as there was nothing further they could offer.      Called pt to verify refill requested. Pt states he is tolerating well. Refill submitted.   Assessment & Plan Note by Melvenia Needles, NP at 12/11/2018 5:21 PM Author: Melvenia Needles, NP Author Type: Nurse Practitioner Filed: 12/11/2018 5:23 PM  Note Status: Written Cosign: Cosign Not Required Encounter Date: 12/10/2018  Problem: IPF (idiopathic pulmonary fibrosis) ([place])  Editor: Melvenia Needles, NP (Nurse Practitioner)    IPF with gradual clinical deterioration.  Essentially PFTs today are stable.  CT chest in March did show evolving radiation fibrosis in the area of previous XRT from his right lower lobe lung cancer. No increased oxygen demands.  Would continue Esbriet.  Activity as tolerated.  Plan  Patient Instructions  IPF (idiopathic pulmonary fibrosis) ([place]) - continue esbriet 3 times daily with meals -Labs today  Continue on Oxygen 2lm -Order POC   Solitary pulmonary nodule  left lower lobe - stable on CT chest 09/2018     Followup

## 2018-12-23 NOTE — Telephone Encounter (Signed)
Pt notified of TP suggestions in regards to rash/Esbriet. Nothing further needed

## 2018-12-23 NOTE — Telephone Encounter (Signed)
I have not seen that , now it does cause photosensitivity , so if out in sun can cause skin irriations.  Would go to Dermatology as recommended.   Please contact office for sooner follow up if symptoms do not improve or worsen or seek emergency care

## 2018-12-25 ENCOUNTER — Telehealth: Payer: Self-pay | Admitting: Internal Medicine

## 2018-12-25 NOTE — Telephone Encounter (Signed)
Called [facility] Spoke with Elta Guadeloupe who states they spoke to patient yesterday at 5 pm and notified shipment would be sent out 6/25. Pt is calling about next refill. Notified this is a 90 day supply. Nothing further needed.

## 2019-01-15 ENCOUNTER — Telehealth: Payer: Self-pay | Admitting: *Deleted

## 2019-01-15 MED ORDER — SIMVASTATIN 20 MG PO TABS: 20.0000 mg | ORAL_TABLET | Freq: Every day | ORAL | 2 refills | Status: DC

## 2019-01-15 NOTE — Telephone Encounter (Signed)
Rx(s) sent to pharmacy electronically.

## 2019-01-15 NOTE — Telephone Encounter (Signed)
Pt calling requesting refill Simvastatin to [facility]. He had a telemed appt 10/2018 with Dr. Claiborne Billings.

## 2019-01-25 ENCOUNTER — Ambulatory Visit (HOSPITAL_COMMUNITY)
Admission: RE | Admit: 2019-01-25 | Discharge: 2019-01-25 | Disposition: A | Discharge status: Home / Self Care | Payer: Medicare Other | Source: Ambulatory Visit | Attending: Internal Medicine | Admitting: Internal Medicine

## 2019-01-25 ENCOUNTER — Other Ambulatory Visit (HOSPITAL_COMMUNITY): Payer: Self-pay | Admitting: Cardiovascular Disease

## 2019-01-25 ENCOUNTER — Ambulatory Visit (HOSPITAL_BASED_OUTPATIENT_CLINIC_OR_DEPARTMENT_OTHER)
Admission: RE | Admit: 2019-01-25 | Discharge: 2019-01-25 | Disposition: A | Discharge status: Home / Self Care | Payer: Medicare Other | Source: Ambulatory Visit | Attending: Cardiovascular Disease | Admitting: Cardiovascular Disease

## 2019-01-25 ENCOUNTER — Other Ambulatory Visit: Payer: Self-pay

## 2019-01-25 DIAGNOSIS — I714 Abdominal aortic aneurysm, without rupture, unspecified: Secondary | ICD-10-CM

## 2019-01-25 DIAGNOSIS — I739 Peripheral vascular disease, unspecified: Secondary | ICD-10-CM

## 2019-01-25 DIAGNOSIS — Z95820 Peripheral vascular angioplasty status with implants and grafts: Secondary | ICD-10-CM

## 2019-02-04 ENCOUNTER — Telehealth: Payer: Self-pay | Admitting: Adult Health

## 2019-02-04 NOTE — Telephone Encounter (Signed)
Spoke with patient. He stated that he was requesting to have a copy of his PFT results to be faxed to [facility]. Patient did not have the fax number. Advised him that I would get a secure fax number and fax over his results. He verbalized understanding.   Called [facility], fax number is [phone].   Results have been faxed.   Nothing further needed at time of call.

## 2019-02-05 ENCOUNTER — Other Ambulatory Visit: Payer: Self-pay | Admitting: *Deleted

## 2019-02-05 DIAGNOSIS — I714 Abdominal aortic aneurysm, without rupture, unspecified: Secondary | ICD-10-CM

## 2019-02-05 DIAGNOSIS — I739 Peripheral vascular disease, unspecified: Secondary | ICD-10-CM

## 2019-03-17 ENCOUNTER — Other Ambulatory Visit: Payer: Self-pay | Admitting: Internal Medicine

## 2019-03-29 ENCOUNTER — Telehealth: Payer: Self-pay | Admitting: Adult Health

## 2019-03-29 MED ORDER — ESBRIET 267 MG PO TABS: 3.0000 | ORAL_TABLET | Freq: Three times a day (TID) | ORAL | 0 refills | Status: DC

## 2019-03-29 NOTE — Telephone Encounter (Signed)
Spoke with pt and advised rx for Esbriet sent to pharmacy. Nothing further is needed.

## 2019-04-12 ENCOUNTER — Other Ambulatory Visit: Payer: Self-pay

## 2019-04-12 ENCOUNTER — Ambulatory Visit (HOSPITAL_COMMUNITY): Payer: Medicare Other | Attending: Cardiology

## 2019-04-12 DIAGNOSIS — E782 Mixed hyperlipidemia: Secondary | ICD-10-CM | POA: Diagnosis present

## 2019-04-12 DIAGNOSIS — Z7901 Long term (current) use of anticoagulants: Secondary | ICD-10-CM | POA: Diagnosis present

## 2019-04-12 DIAGNOSIS — I739 Peripheral vascular disease, unspecified: Secondary | ICD-10-CM

## 2019-04-12 DIAGNOSIS — Z951 Presence of aortocoronary bypass graft: Secondary | ICD-10-CM | POA: Diagnosis present

## 2019-04-12 DIAGNOSIS — J84112 Idiopathic pulmonary fibrosis: Secondary | ICD-10-CM | POA: Diagnosis present

## 2019-04-12 DIAGNOSIS — I350 Nonrheumatic aortic (valve) stenosis: Secondary | ICD-10-CM | POA: Insufficient documentation

## 2019-04-12 DIAGNOSIS — I714 Abdominal aortic aneurysm, without rupture, unspecified: Secondary | ICD-10-CM

## 2019-04-12 DIAGNOSIS — I10 Essential (primary) hypertension: Secondary | ICD-10-CM | POA: Insufficient documentation

## 2019-04-12 DIAGNOSIS — G4733 Obstructive sleep apnea (adult) (pediatric): Secondary | ICD-10-CM | POA: Diagnosis present

## 2019-04-12 DIAGNOSIS — I724 Aneurysm of artery of lower extremity: Secondary | ICD-10-CM | POA: Diagnosis present

## 2019-04-12 DIAGNOSIS — I2510 Atherosclerotic heart disease of native coronary artery without angina pectoris: Secondary | ICD-10-CM

## 2019-05-21 ENCOUNTER — Other Ambulatory Visit: Payer: Self-pay

## 2019-05-21 ENCOUNTER — Ambulatory Visit (INDEPENDENT_AMBULATORY_CARE_PROVIDER_SITE_OTHER): Payer: Medicare Other | Admitting: Cardiovascular Disease

## 2019-05-21 ENCOUNTER — Encounter: Payer: Self-pay | Admitting: Cardiovascular Disease

## 2019-05-21 VITALS — BP 115/61 | HR 74 | Ht 70.0 in | Wt 180.4 lb

## 2019-05-21 DIAGNOSIS — I10 Essential (primary) hypertension: Secondary | ICD-10-CM | POA: Diagnosis not present

## 2019-05-21 DIAGNOSIS — Z951 Presence of aortocoronary bypass graft: Secondary | ICD-10-CM | POA: Diagnosis not present

## 2019-05-21 DIAGNOSIS — I2510 Atherosclerotic heart disease of native coronary artery without angina pectoris: Secondary | ICD-10-CM

## 2019-05-21 DIAGNOSIS — I441 Atrioventricular block, second degree: Secondary | ICD-10-CM

## 2019-05-21 DIAGNOSIS — I739 Peripheral vascular disease, unspecified: Secondary | ICD-10-CM

## 2019-05-21 DIAGNOSIS — Z7901 Long term (current) use of anticoagulants: Secondary | ICD-10-CM

## 2019-05-21 DIAGNOSIS — E782 Mixed hyperlipidemia: Secondary | ICD-10-CM

## 2019-05-21 DIAGNOSIS — J84112 Idiopathic pulmonary fibrosis: Secondary | ICD-10-CM

## 2019-05-21 DIAGNOSIS — I350 Nonrheumatic aortic (valve) stenosis: Secondary | ICD-10-CM

## 2019-05-21 NOTE — Progress Notes (Signed)
Patient ID: Troy Miller, male   DOB: 04/16/1941, 78 y.o.   MRN: [id number] ° ° ° ° °HPI: Troy Miller is a 78 y.o. male who presents for a 7 month cardiology and sleep evaluation.  ° °[name] has a history of CAD and in 1994 underwent initial CABG revascularization surgery. In May 2005 he underwent redo surgery with a RIMA to the LAD, vein graft to distal RCA by Dr. Gerhardt. His previous LIMA graft was preserved and supplied the OM1 vessel. He has history of atrial flutter status post ablation by Dr. Allred as well as a history of PAF. He has documented peripheral vascular disease with abdominal aortic aneurysm, as well as common iliac artery aneurysms. He status post right fem-tib bypass graft surgery. Additional problems include hypertension as well as mixed hyperlipidemia. He also has mild aortic stenosis and documentation of an infrarenal abdominal aortic aneurysm.  ° °A followup echo Doppler study on 05/11/2013 showed an ejection fraction in the 45-50% range with mild inferior hypokinesis.  There was grade 1 diastolic dysfunction.  His aortic valve was moderately calcified and there was evidence for mild aortic valve stenosis with a peak gradient of 19 and mean gradient of 10. Aortic valve area was 1.5 1.64 cm.  There was mild aortic root dilatation at 42 mm mild dilatation of the ascending aorta..  There was mitral regurgitation. ° °A follow-up abdominal aortic Doppler study on April 29, 2014 demonstrated an infrarenal fusiform aneurysm at 3.6×3.7 with mild amount of atherosclerosis visualized throughout.  The right and left proximal common iliac arteries also demonstrated aneurysmal dilatation.  The right common iliac artery measured 2.3.  A 2.3 without evidence for stenosis.  Left common iliac artery measured 3.2 x 3.0 with mild amount of thrombus and low flow velocity.  He underwent a follow-up ultrasound last week which showed slight progression.  His aneurysm in the distal aorta, now  measured 4.0×3.7.  There was a new finding of a right common iliac artery aneurysm measuring 2.2 by 2.3 cm.  The left common iliac artery was stable at 3.2 x 3 point 0 cm.  The IVC veins were patent.  A follow-up evaluation was recommended in one year. ° °A follow-up echo Doppler study on 11/04/2014 demonstrated an EF 50-55% without regional wall motion abnormalities.  There was evidence for mild aortic valve stenosis valve area of 1. 4 9 and 1.43 by measurements. Mean gradient was 14 and peak gradient 32 mmHg. ° °He saw Troy Miller with complaints of chest discomfort in 2016.  A nuclear perfusion study was  on 02/10/2015:  Ejection fraction was 47%. There was evidence for prior inferior perfusion defect suggesting prior infarction.  There was no ischemia. ° °He underwent several treatment of skin lesions on his face by his dermatologist and will need additional treatment for 1.  On his nose which he states was positive for mild malignancy. He denies anginal symptoms.  He denies PND, orthopnea.  He denies bleeding.  ° °Troy Miller has a history of obstructive sleep apnea dating back to 2005.  His initial sleep study showed severe sleep apnea with an AHI of 33.9, and in addition to obstructive events had central events.  Initial O2 desaturation was 81%.  Remotely he had seen Troy Miller, he had undergone a trial of BiPAP, but reportedly had felt better with CPAP therapy.  He has not used CPAP therapy for at least 3-4 years.  His DME company had been [employer] which is   which is no longer in business.  He admits to daytime sleepiness.  He does snore.  He denies any recent CHF symptomatology.   I referred him for a new sleep study whic was done on 12/22/2015 and confirmed moderate sleep apnea with an AHI overall of 26 per hour.  However, sleep apnea was severe during [name] sleep with an AHI 51.4.  He had significant oxygen desaturation to 82% in time below 88% was 76.4 minutes.  There was moderate snoring.  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  He received  a new CPAP machine.  He admits to 100% compliance.  His sleep has improved.  He denies daytime sleepiness and is unaware of breakthrough snoring.  I  saw him in September 2018 was also being seen by the [place].Marland Kitchen  Remotely he developed cough secondary to ace inhibition.  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  Occasionally he require supplemental oxygen if he is walking but typically does not use oxygen routinely.    An echo Doppler study from December 26, 2017 showed an EF of 35 to 40% with to mid inferolateral inferior inferoseptal mild hypokinesis.  There was grade 1 diastolic dysfunction.  There was mild aortic stenosis with a mean gradient of 21 and peak gradient of 31.  The ascending aorta was felt to be mildly dilated.  His RV was mildly dilated.  He had mild increased PA pressure at 34 mmHg. He underwent abdominal aortic Doppler to assess his abdominal aortic aneurysm on January 16, 2018  which showed the largest diameter 3.7 cm, essentially unchanged from prior exams.  Lower extremity duplex showed normal staying ABIs at his right toe brachial index was abnormal as well as his left toe brachial index.  He is being evaluated by Dr. Onnie Miller for intersitital pulmonary fibrosis and was started on Esbriet.  His LFTs have been normal.  He has a history of significant mixed hyperlipidemia and for this reason had been on combination niacin with statin.  ALT on March 31, 2018 was normal at 7 and AST was 13.  He previously had been on omega-3 fatty acids but apparently these were discontinued by Troy Miller according to the patient because of his asbestosis.  He had not had any recent lipid studies.   He underwent lower extremity PV angiography and intervention by Dr. Trula Miller on May 05, 2018 which demonstrated aneurysmal degeneration of the infrarenal  abdominal aorta and bilateral iliac arteries. The right common femoral profundofemoral artery were heavily calcified but patent.  The SFA was occluded.  A bypass graft originating from the SFA had a proximal stenosis and luminal irregularity at the anastomosis.Runoff was to the peroneal artery.  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  He underwent stent to the right SFA and a stent to the left popliteal artery.  He was last evaluated in a telemedicine visit on October 26, 2018.  He believes his blood pressure has been controlled.  He continues to be on valsartan/HCTZ 80/12.5. Recent lipid studies were excellent on his current regimen with simvastatin and niacin with total cholesterol 109 triglycerides 106 HDL 45 LDL 43.  He had been on Xarelto 20 mg and has a remote history of atrial fibrillation ablation.  He has been maintaining sinus rhythm.  the cost of Xarelto was too much and he was having to pay $266 per month out of pocket.  As result he was switched to warfarin Last week he had called the office and was to warfarin.   ° °Since his April telemedicine evaluation, he believes he has done fairly well.  He is on supplemental oxygen 2 L/min at home.  However he has a portable oxygen unit which he takes when he walks and typically it is set at 5 L/min.  He is followed by Dr. Ramaswamy.  He underwent high-resolution CT of his chest in follow-up of his interstitial lung disease and stage I right lower lobe lung cancer treated with radiation therapy concluded in March 2019.  The September 17, 2018 CT demonstrated evolving radiation fibrosis in the posterior right lower lobe without evidence for local tumor recurrence.  He had basilar predominant fibrotic interstitial lung disease with mild honeycombing slightly progressed since 2018.  There also was stable calcified bilateral pleural plaques and smooth pleural thickening  without effusion.  He underwent follow-up vascular imaging studies in July and a Doppler of his abdominal aorta showed dilation of the distal aorta now measuring 3.9 cm compared to 3.6 cm in July 2019.  There was mild dilation of the right common iliac and left common iliac arteries.  Lower extremity Doppler study showed widely patent right proximal SFA stent without restenosis and a widely patent right proximal SFA to proximal peroneal artery bypass graft without restenosis.  The distal left CFA measured 1.8 cm.  There was a widely patent left proximal SFA to proximal TPT stent without stenosis.  He denies any anginal symptoms.  He does get short of breath with walking.  An echo Doppler study and profile showed mild improvement in LV function with EF now at 40 to 45%.  Aortic stenosis was moderate with a peak gradient of 33, mean gradient of 23, and aortic valve area of 1.3 cm².  He admits to occasional palpitations but denies presyncope.  He presents for evaluation. ° ° °Past Medical History:  °Diagnosis Date  °• AAA (abdominal aortic aneurysm) (HCC)   °• Aortic stenosis, mild   ° 07/10/12 EF 50-55% on echo-mitral annular ca+ also  °• Atrial fibrillation (HCC)   ° 03/19/10 ablation-Dr. Taylor-successful  °• Atrial flutter (HCC)   °• CAD (coronary artery disease)   °• DVT (deep venous thrombosis) (HCC)   °• History of stress test   ° Myoview 8/16:  EF 47%, inferior scar, no ischemia, Intermediate risk (low EF)  °• Hyperlipemia   °• Hypertension   °• Iliac artery aneurysm (HCC)   °• PVD (peripheral vascular disease) (HCC)   ° remote right fem-tib bypass graft sugery  °• S/P CABG (coronary artery bypass graft) 1994 & 2005  ° ° °Past Surgical History:  °Procedure Laterality Date  °• ABDOMINAL AORTOGRAM W/LOWER EXTREMITY N/A 02/18/2017  ° Procedure: ABDOMINAL AORTOGRAM W/LOWER EXTREMITY;  Surgeon: Brabham, Vance W, MD;  Location: [facility];  Service: Cardiovascular;  Laterality: N/A;  °• ABDOMINAL AORTOGRAM  W/LOWER EXTREMITY N/A 05/05/2018  ° Procedure: ABDOMINAL AORTOGRAM W/LOWER EXTREMITY;  Surgeon: Brabham, Vance W, MD;  Location: [facility];  Service: Cardiovascular;  Laterality: N/A;  °• BACK SURGERY  2003  °• CARDIAC ELECTROPHYSIOLOGY STUDY AND ABLATION  03/19/10  °• CARDIOVERSION  08/16/08  °• CHOLECYSTECTOMY  08/13/02  °• CORONARY ARTERY BYPASS GRAFT  1994 & 2005  °• FEMORAL-TIBIAL BYPASS GRAFT    ° Remote  °•   HERNIA REPAIR  06/03/07  °• PERIPHERAL VASCULAR INTERVENTION Left 02/18/2017  ° Procedure: PERIPHERAL VASCULAR INTERVENTION;  Surgeon: Brabham, Vance W, MD;  Location: [facility];  Service: Cardiovascular;  Laterality: Left;  POPLITEAL  °• PERIPHERAL VASCULAR INTERVENTION Bilateral 05/05/2018  ° Procedure: PERIPHERAL VASCULAR INTERVENTION;  Surgeon: Brabham, Vance W, MD;  Location: [facility];  Service: Cardiovascular;  Laterality: Bilateral;  Rt fem/pop graft and Lt popliteal  °• VASECTOMY  01/28/08  ° ° °Allergies  °Allergen Reactions  °• Fentanyl   °• Fentanyl And Related Other (See Comments)  °  Hallucinations  ° ° °Current Outpatient Medications  °Medication Sig Dispense Refill  °• acetaminophen (TYLENOL) 500 MG tablet Take 1,000 mg by mouth every 6 (six) hours as needed for moderate pain or headache.     °• aspirin 81 MG tablet Take 81 mg by mouth daily.    °• calcium carbonate (OS-CAL) 600 MG TABS Take 600 mg by mouth 2 (two) times daily with a meal.    °• Cholecalciferol (VITAMIN D) 2000 units CAPS Take 2,000 Units by mouth daily.    °• famotidine (PEPCID) 20 MG tablet TAKE 1 TABLET BY MOUTH EACH NIGHT AT BEDTIME AS DIRECTED 90 tablet 1  °• folic acid (FOLVITE) 1 MG tablet Take 1 mg by mouth every evening.     °• niacin (NIASPAN) 500 MG CR tablet Take 2,000 mg by mouth every evening.     °• oxyCODONE-acetaminophen (PERCOCET) 10-325 MG tablet Take 1 tablet by mouth 3 (three) times daily as needed for severe pain.    °• pantoprazole (PROTONIX) 40 MG tablet TAKE 1 TABLET BY MOUTH  ONCE DAILY 30 TO 60 MINUTES BEFORE FIRST MEAL OF THE DAY 90 tablet 1  °• Pirfenidone (ESBRIET) 267 MG TABS Take 3 tablets (801 mg total) by mouth 3 (three) times daily. 810 tablet 0  °• simvastatin (ZOCOR) 20 MG tablet Take 1 tablet (20 mg total) by mouth daily at 6 PM. 90 tablet 2  °• tamsulosin (FLOMAX) 0.4 MG CAPS capsule Take 0.4 mg by mouth daily.    °• valsartan-hydrochlorothiazide (DIOVAN-HCT) 80-12.5 MG tablet Take 1 tablet by mouth daily. 90 tablet 1  °• warfarin (COUMADIN) 5 MG tablet Take 1 tablet by mouth daily or as directed by PCP 45 tablet 0  ° °No current facility-administered medications for this visit.   ° ° °Social History  ° °Socioeconomic History  °• Marital status: Single  °  Spouse name: Not on file  °• Number of children: Not on file  °• Years of education: Not on file  °• Highest education level: Not on file  °Occupational History  °• Not on file  °Social Needs  °• Financial resource strain: Not on file  °• Food insecurity  °  Worry: Not on file  °  Inability: Not on file  °• Transportation needs  °  Medical: Not on file  °  Non-medical: Not on file  °Tobacco Use  °• Smoking status: Former Smoker  °  Packs/day: 2.00  °  Years: 25.00  °  Pack years: 50.00  °  Types: Cigarettes  °  Quit date: 04/26/1986  °  Years since quitting: 33.0  °• Smokeless tobacco: Never Used  °Substance and Sexual Activity  °• Alcohol use: No  °  Alcohol/week: 0.0 standard drinks  °• Drug use: No  °• Sexual activity: Not on file  °Lifestyle  °• Physical activity  °  Days per week: Not on   file    Minutes per session: Not on file   Stress: Not on file  Relationships   Social connections    Talks on phone: Not on file    Gets together: Not on file    Attends religious service: Not on file    Active member of club or organization: Not on file    Attends meetings of clubs or organizations: Not on file    Relationship status: Not on file   Intimate partner violence    Fear of current or ex partner: Not on  file    Emotionally abused: Not on file    Physically abused: Not on file    Forced sexual activity: Not on file  Other Topics Concern   Not on file  Social History Narrative   Not on file    Family History  Problem Relation Age of Onset   Lung cancer Mother        smoked   Heart failure Father    Stroke Sister    Socially he is divorced has 3 children and 2 grandchildren. He is not routine exercise. No tobacco or alcohol use.  ROS General: Negative; No fevers, chills, or night sweats;  HEENT: Now with a hearing aid in his right ear due to decreased hearing., sinus congestion, difficulty swallowing Pulmonary: Shortness of breath, positive for asbestosis and interstitial pulmonary fibrosis Cardiovascular:  See HPI: No change in claudication GI: Negative; No nausea, vomiting, diarrhea, or abdominal pain GU: Negative; No dysuria, hematuria, or difficulty voiding Musculoskeletal: Negative; no myalgias, joint pain, or weakness Hematologic/Oncology: Negative; no easy bruising, bleeding Endocrine: Negative; no heat/cold intolerance; no diabetes Neuro: Occasional transient toe paresthesias; no changes in balance, headaches Skin: Negative; No rashes or skin lesions Psychiatric: Negative; No behavioral problems, depression Sleep: Positive for sleep apnea, currently untreated for the past 3-4 years.  Originally diagnosed in 2005, both obstructive and central events.  Positive for daytime sleepiness, hypersomnolence; no bruxism, restless legs, hypnogognic hallucinations, no cataplexy Other comprehensive 14 point system review is negative.  PE BP 115/61    Pulse 74    Ht 5' 10" (1.778 m)    Wt 180 lb 6.4 oz (81.8 kg)    SpO2 (!) 87%    BMI 25.88 kg/m    Repeat blood pressure by me was 114/64  Wt Readings from Last 3 Encounters:  05/21/19 180 lb 6.4 oz (81.8 kg)  12/10/18 181 lb 9.6 oz (82.4 kg)  05/05/18 186 lb (84.4 kg)   General: Alert, oriented, no distress.  Skin: normal  turgor, no rashes, warm and dry HEENT: Normocephalic, atraumatic. Pupils equal round and reactive to light; sclera anicteric; extraocular muscles intact;  Nose without nasal septal hypertrophy Mouth/Parynx benign; Mallinpatti scale 3 Neck: No JVD, no carotid bruits; normal carotid upstroke Lungs: clear to ausculatation and percussion; no wheezing or rales Chest wall: without tenderness to palpitation Heart: PMI not displaced, RRR occasional ectopy with mild heart rate irregularity, s1 s2 normal, 2/6 systolic murmur, no diastolic murmur, no rubs, gallops, thrills, or heaves Abdomen: soft, nontender; no hepatosplenomehaly, BS+; abdominal aorta nontender and not dilated by palpation. Back: no CVA tenderness Pulses 2+ Musculoskeletal: full range of motion, normal strength, no joint deformities Extremities: no clubbing cyanosis or edema, Homan's sign negative  Neurologic: grossly nonfocal; Cranial nerves grossly wnl Psychologic: Normal mood and affect   ECG (independently read by me): Sinus rhythm with suggestion of intermittent second-degree Mobitz type I block.  Left axis deviation.  Nonspecific  interventricular block.  QTc interval 435 ms. ° °September 2019 ECG (independently read by me): Sinus rhythm with sinus arrhythmia, isolated PVC.  Left anterior hemiblock.  Early transition with prominent R wave in V1 and V2 ° °September 2018 ECG (independently read by me): sinus bradycardia 51 bpm.  Left axis deviation.  Inferior Q waves with early transition.  Normal intervals. ° °April 2017 ECG (independently read by me): Normal sinus rhythm with PACs.  Heart rate 62 bpm.  Inferior posterior MI ° °November 2016 ECG (independently read by me):  Normal sinus rhythm at 62 bpm.   Incomplete right bundle branch block. ° °Prior ECG (independently read by me): Sinus bradycardia at 58 bpm with an isolate a PVC and occasional PACs with mild sinus arrhythmia.  Evidence for prior inferior posterior infarct.   Nonspecific ST changes. ° °Prior 11/05/2013 ECG (independently read by me): Sinus bradycardia at 49 beats per minute.  QTc interval 426 ms.  PR interval 174 ms.  Mild RV conduction delay ° °Prior 04/26/2013 ECG: Marked sinus bradycardia 43 beats per minute. Left axis deviation. 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 ° °LABS: ° °BMP Latest Ref Rng & Units 05/05/2018 04/10/2018 11/28/2017  °Glucose 70 - 99 mg/dL 105(H) 102(H) 115(H)  °BUN 8 - 23 mg/dL 25(H) 17 23  °Creatinine 0.61 - 1.24 mg/dL 1.50(H) 1.07 1.07  °BUN/Creat Ratio 10 - 24 - 16 -  °Sodium 135 - 145 mmol/L 140 138 137  °Potassium 3.5 - 5.1 mmol/L 3.9 4.2 3.8  °Chloride 98 - 111 mmol/L 102 99 104  °CO2 20 - 29 mmol/L - 21 23  °Calcium 8.6 - 10.2 mg/dL - 9.8 10.2  ° °Hepatic Function Latest Ref Rng & Units 12/10/2018 04/10/2018 03/31/2018  °Total Protein 6.0 - 8.3 g/dL 7.7 7.6 7.7  °Albumin 3.5 - 5.2 g/dL 4.4 4.4 4.4  °AST 0 - 37 U/L 17 13 13  °ALT 0 - 53 U/L 7 6 7  °Alk Phosphatase 39 - 117 U/L 63 74 61  °Total Bilirubin 0.2 - 1.2 mg/dL 0.4 0.4 0.5  °Bilirubin, Direct 0.0 - 0.3 mg/dL 0.1 - 0.1  ° °CBC Latest Ref Rng & Units 05/05/2018 08/25/2017 03/25/2017  °WBC 4.0 - 10.5 K/uL - 8.1 9.9  °Hemoglobin 13.0 - 17.0 g/dL 15.0 15.3 15.9  °Hematocrit 39.0 - 52.0 % 44.0 46.2 47.9  °Platelets 150 - 400 K/uL - 144(L) 157.0  ° °Lab Results  °Component Value Date  ° MCV 95.7 08/25/2017  ° MCV 94.0 03/25/2017  ° MCV 91.1 01/21/2017  ° °No results found for: HGBA1C  °Lab Results  °Component Value Date  ° TSH 2.870 04/10/2018  ° °Lipid Panel  °   °Component Value Date/Time  ° CHOL 109 04/10/2018 0827  ° CHOL 118 05/05/2013 0839  ° TRIG 106 04/10/2018 0827  ° TRIG 202 (H) 05/05/2013 0839  ° HDL 45 04/10/2018 0827  ° HDL 38 (L) 05/05/2013 0839  ° CHOLHDL 2.4 04/10/2018 0827  ° CHOLHDL 2.6 05/20/2014 0823  ° VLDL 27 05/20/2014 0823  ° LDLCALC 43 04/10/2018 0827  ° LDLCALC 40 05/05/2013 0839  ° ° ° °RADIOLOGY: °No results found. ° °IMPRESSION: °1.  Mobitz type I Wenckebach atrioventricular block   °2. CAD in native artery   °3. Hx of CABG   °4. Essential hypertension   °5. Moderate aortic stenosis   °6. PAD (peripheral artery disease) (HCC)   °7. IPF (idiopathic pulmonary fibrosis) (HCC)   °8. Mixed hyperlipidemia   °  9. Chronic anticoagulation     ASSESSMENT AND PLAN: Mr. Heady is a 78 year old Caucasian male who has CAD dating back to 1994 when he underwent his initial CABG surgery. He is status post redo bypass surgery in 2005. He has peripheral vascular disease.   His last nuclear study revealed inferior scar without associated ischemia. 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  He is status post remote right fem-tib bypass graft surgery in 1996. He is followed  by Dr. Trula Miller.  I reviewed his most recent peripheral vascular Doppler studies which showed patent stents in the right proximal SFA, right proximal SFA to peroneal [place] artery bypass graft as well as left proximal SFA to proximal TPT stent.  His blood pressure today is stable on his regimen consisting of valsartan HCT 80/12.5 mg daily.  He has not had any recurrent atrial fibrillation.  However, his ECG today suggest the possibility of second-degree Mobitz type I block.  I am recommending he wear a 2-week event monitor for further evaluation.  He is not on any negative chronotropic medications.  I reviewed his most recent echo Doppler study in follow-up of his aortic stenosis.  His LV function has improved to 40 to 45%, and aortic stenosis is now felt to be moderate with a mean gradient of 23, peak gradient of 33, and aortic valve area of one-point square centimeters.  He is followed by Dr. Chase Caller for his interstitial fibrosis and is on Esbriet in addition to his supplemental oxygen.  He has a history of significant  mixed hyperlipidemia and continues to be on simvastatin 20 mg in addition to niacin.  Remotely he also had been on omega-3 fatty acids.  He is now on warfarin for anticoagulation due to the cost of Xarelto.  He has not had bleeding.  He has a history of obstructive sleep apnea on CPAP therapy.  I will see him in 3 months for reevaluation or sooner as needed.  Time spent: 25 minutes  Troy Sine, MD, [facility]  05/23/2019 9:40 AM

## 2019-05-21 NOTE — Patient Instructions (Addendum)
Medication Instructions:  Your physician recommends that you continue on your current medications as directed. Please refer to the Current Medication list given to you today.  *If you need a refill on your cardiac medications before your next appointment, please call your pharmacy*  Lab Work: NONE If you have labs (blood work) drawn today and your tests are completely normal, you will receive your results only by: Marland Kitchen MyChart Message (if you have MyChart) OR . A paper copy in the mail If you have any lab test that is abnormal or we need to change your treatment, we will call you to review the results.  Testing/Procedures: Your physician has recommended that you wear an event monitor FOR 2 WEEKS. Event monitors are medical devices that record the heart's electrical activity. Doctors most often [place] these monitors to diagnose arrhythmias. Arrhythmias are problems with the speed or rhythm of the heartbeat. The monitor is a small, portable device. You can wear one while you do your normal daily activities. This is usually used to diagnose what is causing palpitations/syncope (passing out). YOU WILL BE CONTACTED BY A NURSE FROM [facility] INFORMATION ON YOUR EVENT MONITOR. YOUR MONITOR MAY BE MAILED TO YOU.    Follow-Up: At [facility], you and your health needs are our priority.  As part of our continuing mission to provide you with exceptional heart care, we have created designated Provider Care Teams.  These Care Teams include your primary Cardiologist (physician) and Advanced Practice Providers (APPs -  Physician Assistants and Nurse Practitioners) who all work together to provide you with the care you need, when you need it.  Your next appointment:   3 months  The format for your next appointment:   In Person  Provider:   You may see Shelva Majestic, MD or one of the following Advanced Practice Providers on your designated Care Team:    Almyra Deforest, PA-C  Fabian Sharp,  PA-C or   Roby Lofts, [place]

## 2019-05-23 ENCOUNTER — Encounter: Payer: Self-pay | Admitting: Cardiovascular Disease

## 2019-06-09 ENCOUNTER — Telehealth: Payer: Self-pay | Admitting: *Deleted

## 2019-06-09 NOTE — Telephone Encounter (Signed)
[facility] to ship a 14 day cardiac event monitor to the patients home.

## 2019-06-17 ENCOUNTER — Ambulatory Visit (INDEPENDENT_AMBULATORY_CARE_PROVIDER_SITE_OTHER): Payer: Medicare Other

## 2019-06-17 DIAGNOSIS — I441 Atrioventricular block, second degree: Secondary | ICD-10-CM | POA: Diagnosis not present

## 2019-06-18 ENCOUNTER — Other Ambulatory Visit: Payer: Self-pay

## 2019-06-18 ENCOUNTER — Telehealth: Payer: Self-pay | Admitting: Cardiovascular Disease

## 2019-06-18 MED ORDER — VALSARTAN-HYDROCHLOROTHIAZIDE 80-12.5 MG PO TABS: 1.0000 | ORAL_TABLET | Freq: Every day | ORAL | 3 refills | Status: DC

## 2019-06-18 NOTE — Telephone Encounter (Signed)
New Message     *STAT* If patient is at the pharmacy, call can be transferred to refill team.   1. Which medications need to be refilled? (please list name of each medication and dose if known) valsartan-hydrochlorothiazide (DIOVAN-HCT) 80-12.5 MG tablet  2. Which pharmacy/location (including street and city if local pharmacy) is medication to be sent to? [facility] [address]  3. Do they need a 30 day or 90 day supply? [place]

## 2019-06-22 ENCOUNTER — Other Ambulatory Visit: Payer: Self-pay

## 2019-06-22 ENCOUNTER — Encounter: Payer: Self-pay | Admitting: Adult Health

## 2019-06-22 ENCOUNTER — Ambulatory Visit (INDEPENDENT_AMBULATORY_CARE_PROVIDER_SITE_OTHER): Payer: Medicare Other | Admitting: Adult Health

## 2019-06-22 VITALS — BP 112/66 | HR 70 | Temp 98.1°F | Ht 70.5 in | Wt 184.8 lb

## 2019-06-22 DIAGNOSIS — I2510 Atherosclerotic heart disease of native coronary artery without angina pectoris: Secondary | ICD-10-CM | POA: Diagnosis not present

## 2019-06-22 DIAGNOSIS — C3431 Malignant neoplasm of lower lobe, right bronchus or lung: Secondary | ICD-10-CM | POA: Diagnosis not present

## 2019-06-22 DIAGNOSIS — J84112 Idiopathic pulmonary fibrosis: Secondary | ICD-10-CM | POA: Diagnosis not present

## 2019-06-22 DIAGNOSIS — J9611 Chronic respiratory failure with hypoxia: Secondary | ICD-10-CM | POA: Diagnosis not present

## 2019-06-22 LAB — HEPATIC FUNCTION PANEL
ALT: 9 U/L (ref 0–53)
AST: 16 U/L (ref 0–37)
Albumin: 4.1 g/dL (ref 3.5–5.2)
Alkaline Phosphatase: 65 U/L (ref 39–117)
Bilirubin, Direct: 0 mg/dL (ref 0.0–0.3)
Total Bilirubin: 0.4 mg/dL (ref 0.2–1.2)
Total Protein: 7.9 g/dL (ref 6.0–8.3)

## 2019-06-22 NOTE — Assessment & Plan Note (Signed)
IPF appears stable PFTs in June without significant change.  We will plan on repeat CT chest in March to follow-up ILD and right lower lung cancer status post XRT in February 2019. He would benefit from pulmonary rehab.  We will look at this on follow-up  Plan  Patient Instructions  IPF (idiopathic pulmonary fibrosis) (HCC) Continue esbriet 3 times daily with meals Labs today  Continue on Oxygen 3l/m .  Activity as tolerated.   Lung Cancer and Lung nodule -  Follow up with CT chest March 2021.  Follow up with [facility] as discussed.    Follow up :  4 months ILD clinic with Dr. Chase Caller

## 2019-06-22 NOTE — Assessment & Plan Note (Addendum)
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  CT chest March 2020 with stable changes and involving radiation fibrosis.   follow-up CT chest planned for March 2021.  Follow-up with radiation oncology as discussed

## 2019-06-22 NOTE — Patient Instructions (Addendum)
IPF (idiopathic pulmonary fibrosis) (HCC) Continue esbriet 3 times daily with meals Labs today  Continue on Oxygen 3l/m .  Activity as tolerated.   Lung Cancer and Lung nodule -  Follow up with CT chest March 2021.  Follow up with [facility] as discussed.    Follow up :  4 months ILD clinic with Dr. Chase Caller

## 2019-06-22 NOTE — Assessment & Plan Note (Signed)
Continue on oxygen 2 to 3 L to keep O2 saturations greater than 88 to 90%.

## 2019-06-22 NOTE — Progress Notes (Signed)
_0  ID: Troy Miller, male    DOB: Apr 23, 1941, 78 y.o.   MRN: [id number]  Chief Complaint  Patient presents with  . Follow-up    IPF     Referring provider: [name], Hershal Coria  HPI: 78 year old male former smoker followed for idiopathic pulmonary fibrosis, Chronic Respiratory Failure on Oxygen 2l/m .  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  Mild right hilar adenopathy is stable stable ILD changes mildly progressed since 2018   06/22/2019 Follow up : ILD, O2 Respiratory Failure and Lung Cancer  Patient returns for a 61-monthfollow-up.  Patient has underlying IPF.  He says overall breathing is doing about the same gets winded with heavy activity. Tries to stay active in home  walks out to mail box, drives. Unable to do yard work .  Wears out with mild activities .  He admits to a sedentary lifestyle.  He remains on oxygen 2 to 3 L.  Pulmonary function testing was done in June and showed stable lung function with FVC at 70% and DLCO at 33%. He remains on Esbriet 3 times daily with meal.  Denies any weight loss nausea vomiting or diarrhea.   Patient was diagnosed with non-small cell carcinoma in the right lower lung mass in February 2019.  He underwent radiation therapy.  Serial CT showed evolving radiation fibrosis in the right lower lobe without evidence of local tumor recurrence in March 2020.  We discussed a follow-up CT in March 2021.  He denies any weight loss or hemoptysis.   Allergies   Allergen Reactions  . Fentanyl   . Fentanyl And Related Other (See Comments)    Hallucinations    Immunization History  Administered Date(s) Administered  . Influenza Split 04/20/2014  . Influenza, High Dose Seasonal PF 05/17/2016, 05/12/2017, 04/08/2019  . Influenza,inj,Quad PF,6+ Mos 03/10/2015  . Influenza-Unspecified 06/02/2012  . Pneumococcal Conjugate-13 02/23/2016    Past Medical History:  Diagnosis Date  . AAA (abdominal aortic aneurysm) ([facility]   . Aortic stenosis, mild    07/10/12 EF 50-55% on echo-mitral annular ca+ also  . Atrial fibrillation ([facility]    03/19/10 ablation-Dr. TGeorgiana Shore . Atrial flutter ([facility]   . CAD (coronary artery disease)   . DVT (deep venous thrombosis) ([facility]   . History of stress test    Myoview 8/16:  EF 47%, inferior scar, no ischemia, Intermediate risk (low EF)  . Hyperlipemia   . Hypertension   . Iliac artery aneurysm ([facility]   . PVD (peripheral vascular disease) ([facility]    remote right fem-tib bypass graft sugery  . 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 Packs/day: 2.00  . Years: 25.00  . Pack years: 50.00  . Types: Cigarettes  . Quit date: 04/26/1986  . Years since quitting: 33.1  Smokeless Tobacco Never Used   Counseling given: Not Answered   Outpatient Medications Prior to Visit  Medication Sig Dispense Refill  . acetaminophen (TYLENOL) 500 MG tablet Take 1,000 mg by mouth every 6 (six) hours as needed for moderate pain or headache.     Marland Kitchen aspirin 81 MG tablet Take 81 mg by mouth daily.    . calcium carbonate (OS-CAL) 600 MG TABS Take 600 mg by mouth 2 (two) times daily with a meal.    . Cholecalciferol (VITAMIN D) 2000 units CAPS Take 2,000 Units by mouth daily.    . famotidine (PEPCID) 20 MG tablet TAKE 1 TABLET BY MOUTH EACH NIGHT AT BEDTIME AS DIRECTED 90 tablet 1  . finasteride (PROSCAR) 5 MG tablet Take 5 mg by mouth  daily.    . folic acid (FOLVITE) 1 MG tablet Take 1 mg by mouth every evening.     . hydrOXYzine (ATARAX/VISTARIL) 25 MG tablet Take 25 mg by mouth 3 (three) times daily.    . niacin (NIASPAN) 500 MG CR tablet Take 2,000 mg by mouth every evening.     Marland Kitchen oxyCODONE-acetaminophen (PERCOCET) 10-325 MG tablet Take 1 tablet by mouth 3 (three) times daily as needed for severe pain.    . pantoprazole (PROTONIX) 40 MG tablet TAKE 1 TABLET BY MOUTH ONCE DAILY 30 TO 60 MINUTES BEFORE FIRST MEAL OF THE DAY 90 tablet 1  . Pirfenidone (ESBRIET) 267 MG TABS Take 3 tablets (801 mg total) by mouth 3 (three) times daily. 810 tablet 0  . simvastatin (ZOCOR) 20 MG tablet Take 1 tablet (20 mg total) by mouth daily at 6 PM. 90 tablet 2  . tamsulosin (FLOMAX) 0.4 MG CAPS capsule Take 0.4 mg by mouth daily.    . valsartan-hydrochlorothiazide (DIOVAN-HCT) 80-12.5 MG tablet Take 1 tablet by mouth daily. 90 tablet 3  . warfarin (COUMADIN) 5 MG tablet Take 1 tablet by mouth daily or as directed by PCP 45 tablet 0   No facility-administered medications prior to visit.     Review of Systems:   Constitutional:   No  weight loss, night sweats,  Fevers, chills,  +fatigue, or  lassitude.  HEENT:   No headaches,  Difficulty swallowing,  Tooth/dental problems, or  Sore throat,                No sneezing, itching, ear ache, nasal congestion, post nasal drip,   CV:  No chest pain,  Orthopnea, PND, swelling in lower extremities, anasarca, dizziness, palpitations, syncope.   GI  No heartburn, indigestion, abdominal pain, nausea, vomiting, diarrhea, change in bowel habits, loss of appetite, bloody stools.   Resp:    No chest wall deformity  Skin: no rash or lesions.  GU: no dysuria, change in color of urine, no urgency or frequency.  No flank pain, no hematuria   MS:  No joint pain or swelling.  No decreased range of motion.  No back pain.    Physical Exam  BP 112/66 (BP Location: Left Arm, Cuff Size: Normal)    Pulse 70   Temp 98.1 F (36.7 C) (Temporal)   Ht 5' 10.5" (1.791 m)   Wt 184 lb 12.8 oz (83.8 kg)   SpO2 92% Comment: RA  BMI 26.14 kg/m   GEN: A/Ox3; pleasant , NAD, elderly    HEENT:  [place]/AT,  , NOSE-clear, THROAT-clear, no lesions, no postnasal drip or exudate noted.   NECK:  Supple w/ fair ROM; no JVD; normal carotid impulses w/o bruits; no thyromegaly or nodules palpated; no lymphadenopathy.    RESP bibasilar crackles  no accessory muscle  use, no dullness to percussion  CARD:  RRR, no m/r/g, no peripheral edema, pulses intact, no cyanosis or clubbing.  GI:   Soft & nt; nml bowel sounds; no organomegaly or masses detected.   Musco: Warm bil, no deformities or joint swelling noted.   Neuro: alert, no focal deficits noted.    Skin: Warm, no lesions or rashes    Lab Results:  CBC  BMET BNP No results found for: BNP  Imaging: No results found.    PFT Results Latest Ref Rng & Units 12/10/2018 03/31/2018 08/01/2017 12/09/2016 05/03/2016 04/14/2015  FVC-Pre L 2.99 3.35 3.53 3.84 3.70 3.96  FVC-Predicted Pre % 70 79 82 89 85 90  FVC-Post L - - - - 3.92 4.03  FVC-Predicted Post % - - - - 90 92  Pre FEV1/FVC % % 94 85 80 76 75 72  Post FEV1/FCV % % - - - - 73 72  FEV1-Pre L 2.83 2.85 2.83 2.92 2.78 2.86  FEV1-Predicted Pre % 93 94 92 94 89 90  FEV1-Post L - - - - 2.85 2.91  DLCO UNC% % 33 31 32 34 36 40  DLCO COR %Predicted % 38 41 41 41 50 56  TLC L - - - - 5.96 5.72  TLC % Predicted % - - - - 82 78  RV % Predicted % - - - - 77 57    No results found for: NITRICOXIDE      Assessment & Plan:   No problem-specific Assessment & Plan notes found for this encounter.     Rexene Edison, NP 06/22/2019

## 2019-06-23 ENCOUNTER — Telehealth: Payer: Self-pay | Admitting: *Deleted

## 2019-06-23 NOTE — Telephone Encounter (Signed)
Discussed with Kerin Ransom PA  Will continue to monitor no changes ./cy

## 2019-06-23 NOTE — Telephone Encounter (Signed)
Follow Up  Patient returning call. Please give patient a call back.

## 2019-06-23 NOTE — Telephone Encounter (Signed)
Lm to call back re serious recording sent from [employer] occurring 06/23/19 at 2:30 am (CST) Atrial fibrillation w/MF PVCs (5 in 1 min) Artifact .Adonis Housekeeper

## 2019-06-23 NOTE — Telephone Encounter (Signed)
Per pt not aware of episode was sleeping and did not awaken Will forward to Dr Claiborne Billings for review and recommendations ./cy

## 2019-06-25 ENCOUNTER — Encounter: Payer: Self-pay | Admitting: Pharmacist Clinician (PhC)/ Clinical Pharmacy Specialist

## 2019-06-25 ENCOUNTER — Telehealth: Payer: Self-pay | Admitting: Cardiovascular Disease

## 2019-06-25 NOTE — Telephone Encounter (Signed)
Strip reviewed by DOD Dr. Gwenlyn Found. No further recommendations made.

## 2019-06-25 NOTE — Telephone Encounter (Signed)
Received call from [place] regarding critical EKG of atrial flutter w/ variable conduction. Spoke with pt who state he has not had no symptoms. Pt states that he did go to the restroom around 2:30 -3 this morning. EKG faxed to [facility] office to be reviewed by DOD.

## 2019-06-25 NOTE — Progress Notes (Signed)
  This encounter was created in error - please disregard. This encounter was created in error - please disregard.

## 2019-06-25 NOTE — Telephone Encounter (Signed)
[employer] called to report critical EKG.

## 2019-06-27 NOTE — Telephone Encounter (Signed)
agree

## 2019-07-07 ENCOUNTER — Telehealth: Payer: Self-pay | Admitting: Cardiovascular Disease

## 2019-07-07 NOTE — Telephone Encounter (Signed)
Pharmacy please comment on Coumadin and then we'll contact the patient about clearance.  Kerin Ransom PA-C 07/07/2019 5:02 PM

## 2019-07-07 NOTE — Telephone Encounter (Signed)
   [facility] Pre-operative Risk Assessment    Request for surgical clearance:  1. What type of surgery is being performed? Excision squamous cell carcinoma   2. When is this surgery scheduled? Not listed  3. What type of clearance is required (medical clearance vs. Pharmacy clearance to hold med vs. Both)? medical  4. Are there any medications that need to be held prior to surgery and how long?Off coumadin 1 week prior   5. Practice name and name of physician performing surgery? Dr. Wilford Grist, [facility]  6. What is your office phone number: [phone]   7.   What is your office fax number: [phone]  8.   Anesthesia type (None, local, MAC, general) ? Not listed   Annabell Sabal 07/07/2019, 4:31 PM  _________________________________________________________________   (provider comments below)

## 2019-07-08 NOTE — Telephone Encounter (Signed)
Patient with diagnosis of afib on warfarin for anticoagulation.    Procedure: Excision squamous cell carcinoma  Date of procedure: TBD  CHADS2-VASc score of  5 (CHF, HTN, AGE, CAD, AGE)  Of note, patient has a remote history of DVT.   Per office protocol, patient can hold warfarin for 5 days prior to procedure.    Patient will NOT need bridging with Lovenox (enoxaparin) around procedure.

## 2019-07-08 NOTE — Telephone Encounter (Signed)
Hi all,  I just spoke with Troy Miller. His primary care follows his INR. Was last checked a week ago. I told him to make sure he makes them aware of his surgery date.   Loel Dubonnet, NP

## 2019-07-08 NOTE — Telephone Encounter (Signed)
   Primary Cardiologist: Shelva Majestic, MD  Chart reviewed as part of pre-operative protocol coverage. Patient was contacted 07/08/2019 in reference to pre-operative risk assessment for pending surgery as outlined below.  Troy Miller was last seen on 05/21/19 by Dr. Claiborne Billings.  Since that day, SHAHIEM BEDWELL has done well. He reports no anginal symptoms. Reports his breathing is at his baseline.   Tells me his primary care office [facility] [place], [place]) checks his INR and manages his coumadin. His INR was last checked about a week and a half ago and he has an upcoming appointment for a recheck.   He has a PMH CAD s/p CABG 1994 and redo CABG 2005, atrial flutter s/p paglation, PAF, PVD, abd aortic aneurysm, common iliac artery anerusym, s/p R fem-teb bypass, HTN, HLD, mild AS, infrarenal abdominal aortic system, ILD on home O2. He follows with Dr. Trula Slade of [facility]. At visit 05/21/19 he was recommended a 2-week event monitor for the possibiltiy of second-degree Mobitz type I block. LVEF noted to improve to 40-45%, AS moderate.   Therefore, based on ACC/AHA guidelines, the patient would be at acceptable risk for the planned procedure without further cardiovascular testing.   Per our pharmacy he may hold his warfarin for 5 days prior to the procedure - he was made aware of the recommendation via telephone. He will not require bridging with Lovenox.  I will route this recommendation to the requesting party via Epic fax function and remove from pre-op pool.  Please call with questions.  Loel Dubonnet, NP 07/08/2019, 12:56 PM

## 2019-07-14 ENCOUNTER — Telehealth: Payer: Self-pay | Admitting: Adult Health

## 2019-07-14 MED ORDER — ESBRIET 267 MG PO TABS: 3.0000 | ORAL_TABLET | Freq: Three times a day (TID) | ORAL | 5 refills | Status: AC

## 2019-07-14 NOTE — Telephone Encounter (Signed)
Called and spoke with pt and verified the pharmacy he uses for the [place] and sent Rx to pharmacy for pt. Nothing further needed.

## 2019-07-21 NOTE — Telephone Encounter (Signed)
Follow Up:    Inez Catalina is calling to check on the status of pt's clearance. She need this asap, please fax to [phone] Att: Inez Catalina.-

## 2019-07-23 ENCOUNTER — Telehealth: Payer: Self-pay | Admitting: Cardiovascular Disease

## 2019-07-23 NOTE — Telephone Encounter (Signed)
New Message  [facility] called and was to see if certains medicines can be cleared or be taken with coumedin. Patient is having surgery and would like clearance on some medications  Please call to discuss

## 2019-07-23 NOTE — Telephone Encounter (Signed)
Please see 12/30 clearance telephone encounter for further info

## 2019-07-23 NOTE — Telephone Encounter (Signed)
Returned call. Spoke with after-hours staff who states their office closes at 12:00pm/12:30 pm. She questioned when [facility] was contacted. Informed her that call received 19 min ago. She requested pt info and states she will have staff member from their office call back to discuss

## 2019-07-23 NOTE — Telephone Encounter (Signed)
Incoming call from Mining engineer. NP from [facility] calling for update on clearance for pt excision squamous cell carcinoma. She states Dr. Katherine Roan contacted her for update on clearance. Informed her that it appears pt was cleared to hold warfarin for 5 days prior to procedure but triage nurse will route encounter as high priority so pre-op pool aware

## 2019-08-23 ENCOUNTER — Encounter: Payer: Self-pay | Admitting: Cardiovascular Disease

## 2019-08-23 ENCOUNTER — Ambulatory Visit (INDEPENDENT_AMBULATORY_CARE_PROVIDER_SITE_OTHER): Payer: Medicare Other | Admitting: Cardiovascular Disease

## 2019-08-23 ENCOUNTER — Other Ambulatory Visit: Payer: Self-pay

## 2019-08-23 DIAGNOSIS — I952 Hypotension due to drugs: Secondary | ICD-10-CM | POA: Diagnosis not present

## 2019-08-23 DIAGNOSIS — Z951 Presence of aortocoronary bypass graft: Secondary | ICD-10-CM | POA: Diagnosis not present

## 2019-08-23 DIAGNOSIS — I2510 Atherosclerotic heart disease of native coronary artery without angina pectoris: Secondary | ICD-10-CM | POA: Diagnosis not present

## 2019-08-23 DIAGNOSIS — I4892 Unspecified atrial flutter: Secondary | ICD-10-CM

## 2019-08-23 DIAGNOSIS — E782 Mixed hyperlipidemia: Secondary | ICD-10-CM | POA: Diagnosis not present

## 2019-08-23 DIAGNOSIS — I480 Paroxysmal atrial fibrillation: Secondary | ICD-10-CM

## 2019-08-23 DIAGNOSIS — G4733 Obstructive sleep apnea (adult) (pediatric): Secondary | ICD-10-CM

## 2019-08-23 DIAGNOSIS — I350 Nonrheumatic aortic (valve) stenosis: Secondary | ICD-10-CM

## 2019-08-23 DIAGNOSIS — I739 Peripheral vascular disease, unspecified: Secondary | ICD-10-CM | POA: Diagnosis not present

## 2019-08-23 DIAGNOSIS — J84112 Idiopathic pulmonary fibrosis: Secondary | ICD-10-CM

## 2019-08-23 DIAGNOSIS — Z7901 Long term (current) use of anticoagulants: Secondary | ICD-10-CM

## 2019-08-23 MED ORDER — VALSARTAN 80 MG PO TABS: 40.0000 mg | ORAL_TABLET | Freq: Every day | ORAL | 3 refills | Status: DC

## 2019-08-23 NOTE — Patient Instructions (Signed)
Medication Instructions:  STOP TAKING VALSARTAN HCTZ BEGIN TAKING VALSARTAN 40MG DAILY (1/2 TABLET DAILY) (IF BP INCREASES, MAY NEED TO INCREASE TO WHOLE TABLET) *If you need a refill on your cardiac medications before your next appointment, please call your pharmacy*   Testing/Procedures: ECHO IN April/MAY Your physician has requested that you have an echocardiogram. Echocardiography is a painless test that uses sound waves to create images of your heart. It provides your doctor with information about the size and shape of your heart and how well your heart's chambers and valves are working. This procedure takes approximately one hour. There are no restrictions for this procedure.  [place]  Follow-Up: At [facility], you and your health needs are our priority.  As part of our continuing mission to provide you with exceptional heart care, we have created designated Provider Care Teams.  These Care Teams include your primary Cardiologist (physician) and Advanced Practice Providers (APPs -  Physician Assistants and Nurse Practitioners) who all work together to provide you with the care you need, when you need it.  Your next appointment:   2 month(s)  The format for your next appointment:   In Person  Provider:   Shelva Majestic, MD  Other Instructions FOLLOW UP WITH PCP IN 1 WEEK RESUME CPAP

## 2019-08-23 NOTE — Progress Notes (Signed)
Patient ID: Troy Miller, male   DOB: 04/01/1941, 78 y.o.   MRN: [id number]     HPI: Troy Miller is a 78 y.o. male who presents for a 3 month cardiology and sleep evaluation.   Troy Miller has a history of CAD and in 1994 underwent initial CABG revascularization surgery. In May 2005 he underwent redo surgery with a RIMA to the LAD, vein graft to distal RCA by Dr. Gerhardt. His previous LIMA graft was preserved and supplied the OM1 vessel. He has history of atrial flutter status post ablation by Dr. Allred as well as a history of PAF. He has documented peripheral vascular disease with abdominal aortic aneurysm, as well as common iliac artery aneurysms. He status post right fem-tib bypass graft surgery. Additional problems include hypertension as well as mixed hyperlipidemia. He also has mild aortic stenosis and documentation of an infrarenal abdominal aortic aneurysm.   A followup echo Doppler study on 05/11/2013 showed an ejection fraction in the 45-50% range with mild inferior hypokinesis.  There was grade 1 diastolic dysfunction.  His aortic valve was moderately calcified and there was evidence for mild aortic valve stenosis with a peak gradient of 19 and mean gradient of 10. Aortic valve area was 1.5 1.64 cm.  There was mild aortic root dilatation at 42 mm mild dilatation of the ascending aorta..  There was mitral regurgitation.  A follow-up abdominal aortic Doppler study on April 29, 2014 demonstrated an infrarenal fusiform aneurysm at 3.63.7 with mild amount of atherosclerosis visualized throughout.  The right and left proximal common iliac arteries also demonstrated aneurysmal dilatation.  The right common iliac artery measured 2.3.  A 2.3 without evidence for stenosis.  Left common iliac artery measured 3.2 x 3.0 with mild amount of thrombus and low flow velocity.  He underwent a follow-up ultrasound last week which showed slight progression.  His aneurysm in the distal aorta, now  measured 4.03.7.  There was a new finding of a right common iliac artery aneurysm measuring 2.2 by 2.3 cm.  The left common iliac artery was stable at 3.2 x 3 point 0 cm.  The IVC veins were patent.  A follow-up evaluation was recommended in one year.  A follow-up echo Doppler study on 11/04/2014 demonstrated an EF 50-55% without regional wall motion abnormalities.  There was evidence for mild aortic valve stenosis valve area of 1. 4 9 and 1.43 by measurements. Mean gradient was 14 and peak gradient 32 mmHg.  He saw Scott Weaver with complaints of chest discomfort in 2016.  A nuclear perfusion study was  on 02/10/2015:  Ejection fraction was 47%. There was evidence for prior inferior perfusion defect suggesting prior infarction.  There was no ischemia.  He underwent several treatment of skin lesions on his face by his dermatologist and will need additional treatment for 1.  On his nose which he states was positive for mild malignancy. He denies anginal symptoms.  He denies PND, orthopnea.  He denies bleeding.   Troy Miller has a history of obstructive sleep apnea dating back to 2005.  His initial sleep study showed severe sleep apnea with an AHI of 33.9, and in addition to obstructive events had central events.  Initial O2 desaturation was 81%.  Remotely he had seen Dr. Domeier, he had undergone a trial of BiPAP, but reportedly had felt better with CPAP therapy.  He has not used CPAP therapy for at least 3-4 years.  His DME company had been [employer] which is   no longer in business.  He admits to daytime sleepiness.  He does snore.  He denies any recent CHF symptomatology.   I referred him for a new sleep study which was done on 12/22/2015 and confirmed moderate sleep apnea with an AHI overall of 26 per hour.  However, sleep apnea was severe during REM sleep with an AHI 51.4.  He had significant oxygen desaturation to 82% in time below 88% was 76.4 minutes.  There was moderate snoring.  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  He received  a new CPAP machine.  He admits to 100% compliance.  His sleep has improved.  He denies daytime sleepiness and is unaware of breakthrough snoring.  I  saw him in September 2018 was also being seen by the [place].Marland Kitchen  Remotely he developed cough secondary to ace inhibition.  He is seeing Dr. 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  Occasionally he require supplemental oxygen if he is walking but typically does not use oxygen routinely.    An echo Doppler study from December 26, 2017 showed an EF of 35 to 40% with to mid inferolateral inferior inferoseptal mild hypokinesis.  There was grade 1 diastolic dysfunction.  There was mild aortic stenosis with a mean gradient of 21 and peak gradient of 31.  The ascending aorta was felt to be mildly dilated.  His RV was mildly dilated.  He had mild increased PA pressure at 34 mmHg. He underwent abdominal aortic Doppler to assess his abdominal aortic aneurysm on January 16, 2018  which showed the largest diameter 3.7 cm, essentially unchanged from prior exams.  Lower extremity duplex showed normal staying ABIs at his right toe brachial index was abnormal as well as his left toe brachial index.  He is being evaluated by Dr. Onnie Graham for intersitital pulmonary fibrosis and was started on Esbriet.  His LFTs have been normal.  He has a history of significant mixed hyperlipidemia and for this reason had been on combination niacin with statin.  ALT on March 31, 2018 was normal at 7 and AST was 13.  He previously had been on omega-3 fatty acids but apparently these were discontinued by Dr. Melvyn Novas according to the patient because of his asbestosis.  He had not had any recent lipid studies.   He underwent lower extremity PV angiography and intervention by Dr. Trula Slade on May 05, 2018 which demonstrated aneurysmal degeneration of the infrarenal  abdominal aorta and bilateral iliac arteries. The right common femoral profundofemoral artery were heavily calcified but patent.  The SFA was occluded.  A bypass graft originating from the SFA had a proximal stenosis and luminal irregularity at the anastomosis. Runoff was to the peroneal artery.  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  He underwent stent to the right SFA and a stent to the left popliteal artery.  He was last evaluated in a telemedicine visit on October 26, 2018.  He believes his blood pressure has been controlled.  He continues to be on valsartan/HCTZ 80/12.5. Recent lipid studies were excellent on his current regimen with simvastatin and niacin with total cholesterol 109 triglycerides 106 HDL 45 LDL 43.  He had been on Xarelto 20 mg and has a remote history of atrial fibrillation ablation.  He has been maintaining sinus rhythm.  Unfortunately the cost of Xarelto was too much and he was having to pay $266 per month out of pocket.  As result he was switched to warfarin Last week he had called the office and was to warfarin.    Since his April telemedicine evaluation, he believes he has done fairly well.  He is on supplemental oxygen 2 L/min at home.  However he has a portable oxygen unit which he takes when he walks and typically it is set at 5 L/min.  He is followed by Dr. Chase Caller.  He underwent high-resolution CT of his chest in follow-up of his interstitial lung disease and stage I right lower lobe lung cancer treated with radiation therapy concluded in March 2019.  The September 17, 2018 CT demonstrated evolving radiation fibrosis in the posterior right lower lobe without evidence for local tumor recurrence.  He had basilar predominant fibrotic interstitial lung disease with mild honeycombing slightly progressed since 2018.  There also was stable calcified bilateral pleural plaques and smooth pleural thickening  without effusion.  He underwent follow-up vascular imaging studies in July and a Doppler of his abdominal aorta showed dilation of the distal aorta now measuring 3.9 cm compared to 3.6 cm in July 2019.  There was mild dilation of the right common iliac and left common iliac arteries.  Lower extremity Doppler study showed widely patent right proximal SFA stent without restenosis and a widely patent right proximal SFA to proximal peroneal artery bypass graft without restenosis.  The distal left CFA measured 1.8 cm.  There was a widely patent left proximal SFA to proximal TPT stent without stenosis.  He denies any anginal symptoms.  He does get short of breath with walking.  An echo Doppler study and profile showed mild improvement in LV function with EF now at 40 to 45%.  Aortic stenosis was moderate with a peak gradient of 33, mean gradient of 23, and aortic valve area of 1.3 cm.  He admits to occasional palpitations but denies presyncope.    Since I last saw him in November 2020, he wore a cardiac monitor for 2 weeks.  This demonstrated predominant sinus rhythm with an average heart rate at 65 bpm.  The slowest heart rate was sinus bradycardia at 47 which occurred at 12:50 AM on June 21, 2019 and sinus tachycardia at 111 bpm at which occurred at 8:23 AM on December 15.  He was found to have 4 episodes of arrhythmia with 2 episodes of atrial fibrillation with multifocal PVCs, and 2 episodes of atrial flutter with variable block.  He is now on supplemental oxygen.  He has a long history of prior asbestosis exposure.  He had not been using his CPAP.  He received a new machine from the [place].  He is on warfarin anticoagulation.  He has been on valsartan HCT 80/12.5 for hypertension.  He is on finasteride and tamsulosin for prostate issues.  He is on Esbriet for his interstitial lung disease.  He presents for evaluation.  Past Medical History:  Diagnosis Date  . AAA (abdominal aortic aneurysm) ([place])   . Aortic  stenosis, mild    07/10/12 EF 50-55% on echo-mitral annular ca+ also  . Atrial fibrillation ([place])    03/19/10 ablation-[name]  . Atrial flutter ([place])   . CAD (coronary artery disease)   . DVT (deep venous thrombosis) ([place])   . History of stress test    Myoview 8/16:  EF 47%, inferior scar, no ischemia, Intermediate risk (low EF)  .  Hyperlipemia   . Hypertension   . Iliac artery aneurysm ([facility])   . PVD (peripheral vascular disease) ([facility])    remote right fem-tib bypass graft sugery  . 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 ABDOMINAL AORTOGRAM W/LOWER EXTREMITY N/A 02/18/2017   Procedure: ABDOMINAL AORTOGRAM W/LOWER EXTREMITY;  Surgeon: Brabham, Vance W, MD;  Location: [facility];  Service: Cardiovascular;  Laterality: N/A;  . ABDOMINAL AORTOGRAM W/LOWER EXTREMITY N/A 05/05/2018   Procedure: ABDOMINAL AORTOGRAM W/LOWER EXTREMITY;  Surgeon: Brabham, Vance W, MD;  Location: [facility];  Service: Cardiovascular;  Laterality: N/A;  . BACK SURGERY  2003  . CARDIAC ELECTROPHYSIOLOGY STUDY AND ABLATION  03/19/10  . CARDIOVERSION  08/16/08  . CHOLECYSTECTOMY  08/13/02  . CORONARY ARTERY BYPASS GRAFT  1994 & 2005  . FEMORAL-TIBIAL BYPASS GRAFT     Remote  . HERNIA REPAIR  06/03/07  . PERIPHERAL VASCULAR INTERVENTION Left 02/18/2017   Procedure: PERIPHERAL VASCULAR INTERVENTION;  Surgeon: Brabham, Vance W, MD;  Location: [facility];  Service: Cardiovascular;  Laterality: Left;  POPLITEAL  . PERIPHERAL VASCULAR INTERVENTION Bilateral 05/05/2018   Procedure: PERIPHERAL VASCULAR INTERVENTION;  Surgeon: Brabham, Vance W, MD;  Location: [facility];  Service: Cardiovascular;  Laterality: Bilateral;  Rt fem/pop graft and Lt popliteal  . VASECTOMY  01/28/08    Allergies  Allergen Reactions  . Fentanyl   . Fentanyl And Related Other (See Comments)    Hallucinations    Current Outpatient  Medications  Medication Sig Dispense Refill  . acetaminophen (TYLENOL) 500 MG tablet Take 1,000 mg by mouth every 6 (six) hours as needed for moderate pain or headache.     . aspirin 81 MG tablet Take 81 mg by mouth daily.    . calcium carbonate (OS-CAL) 600 MG TABS Take 600 mg by mouth 2 (two) times daily with a meal.    . Cholecalciferol (VITAMIN D) 2000 units CAPS Take 2,000 Units by mouth daily.    . famotidine (PEPCID) 20 MG tablet TAKE 1 TABLET BY MOUTH EACH NIGHT AT BEDTIME AS DIRECTED 90 tablet 1  . finasteride (PROSCAR) 5 MG tablet Take 5 mg by mouth daily.    . folic acid (FOLVITE) 1 MG tablet Take 1 mg by mouth every evening.     . hydrOXYzine (ATARAX/VISTARIL) 25 MG tablet Take 25 mg by mouth 3 (three) times daily.    . niacin (NIASPAN) 500 MG CR tablet Take 2,000 mg by mouth every evening.     . oxyCODONE-acetaminophen (PERCOCET) 10-325 MG tablet Take 1 tablet by mouth 3 (three) times daily as needed for severe pain.    . pantoprazole (PROTONIX) 40 MG tablet TAKE 1 TABLET BY MOUTH ONCE DAILY 30 TO 60 MINUTES BEFORE FIRST MEAL OF THE DAY 90 tablet 1  . Pirfenidone (ESBRIET) 267 MG TABS Take 3 tablets (801 mg total) by mouth 3 (three) times daily. 810 tablet 5  . simvastatin (ZOCOR) 20 MG tablet Take 1 tablet (20 mg total) by mouth daily at 6 PM. 90 tablet 2  . tamsulosin (FLOMAX) 0.4 MG CAPS capsule Take 0.4 mg by mouth daily.    . warfarin (COUMADIN) 5 MG tablet Take 1 tablet by mouth daily or as directed by PCP 45 tablet 0  . valsartan (DIOVAN) 80 MG tablet Take 0.5 tablets (40 mg total) by mouth daily. TAKE 1/2 TABLET (40MG DAILY). 90 tablet   3   No current facility-administered medications for this visit.    Social History   Socioeconomic History  . Marital status: Single    Spouse name: Not on file  . Number of children: Not on file  . Years of education: Not on file  . Highest education level: Not on file  Occupational History  . Not on file  Tobacco Use  . Smoking  status: Former Smoker    Packs/day: 2.00    Years: 25.00    Pack years: 50.00    Types: Cigarettes    Quit date: 04/26/1986    Years since quitting: 33.3  . Smokeless tobacco: Never Used  Substance and Sexual Activity  . Alcohol use: No    Alcohol/week: 0.0 standard drinks  . Drug use: No  . Sexual activity: Not on file  Other Topics Concern  . Not on file  Social History Narrative  . Not on file   Social Determinants of Health   Financial Resource Strain:   . Difficulty of Paying Living Expenses: Not on file  Food Insecurity:   . Worried About Running Out of Food in the Last Year: Not on file  . Ran Out of Food in the Last Year: Not on file  Transportation Needs:   . Lack of Transportation (Medical): Not on file  . Lack of Transportation (Non-Medical): Not on file  Physical Activity:   . Days of Exercise per Week: Not on file  . Minutes of Exercise per Session: Not on file  Stress:   . Feeling of Stress : Not on file  Social Connections:   . Frequency of Communication with Friends and Family: Not on file  . Frequency of Social Gatherings with Friends and Family: Not on file  . Attends Religious Services: Not on file  . Active Member of Clubs or Organizations: Not on file  . Attends Club or Organization Meetings: Not on file  . Marital Status: Not on file  Intimate Partner Violence:   . Fear of Current or Ex-Partner: Not on file  . Emotionally Abused: Not on file  . Physically Abused: Not on file  . Sexually Abused: Not on file    Family History  Problem Relation Age of Onset  . Lung cancer Mother        smoked  . Heart failure Father   . Stroke Sister    Socially he is divorced has 3 children and 2 grandchildren. He is not routine exercise. No tobacco or alcohol use.  ROS General: Negative; No fevers, chills, or night sweats;  HEENT: Now with a hearing aid in his right ear due to decreased hearing., sinus congestion, difficulty swallowing Pulmonary:  Shortness of breath, positive for asbestosis and interstitial pulmonary fibrosis Cardiovascular:  See HPI: No change in claudication GI: Negative; No nausea, vomiting, diarrhea, or abdominal pain GU: Negative; No dysuria, hematuria, or difficulty voiding Musculoskeletal: Negative; no myalgias, joint pain, or weakness Hematologic/Oncology: Negative; no easy bruising, bleeding Endocrine: Negative; no heat/cold intolerance; no diabetes Neuro: Occasional transient toe paresthesias; no changes in balance, headaches Skin: Negative; No rashes or skin lesions Psychiatric: Negative; No behavioral problems, depression Sleep: Positive for sleep apnea, currently untreated for the past 3-4 years.  Originally diagnosed in 2005, both obstructive and central events.  Positive for daytime sleepiness, hypersomnolence; no bruxism, restless legs, hypnogognic hallucinations, no cataplexy Other comprehensive 14 point system review is negative.  PE BP (!) 102/55   Pulse 84   Ht 5' 10.5" (1.791   m)   Wt 178 lb 9.6 oz (81 kg)   SpO2 92%   BMI 25.26 kg/m    Repeat blood pressure by me was 82/55 supine 56 standing  Wt Readings from Last 3 Encounters:  08/23/19 178 lb 9.6 oz (81 kg)  06/22/19 184 lb 12.8 oz (83.8 kg)  05/21/19 180 lb 6.4 oz (81.8 kg)   General: Alert, oriented, no distress.  Skin: normal turgor, no rashes, warm and dry HEENT: Normocephalic, atraumatic. Pupils equal round and reactive to light; sclera anicteric; extraocular muscles intact;  Nose without nasal septal hypertrophy Mouth/Parynx benign; Mallinpatti scale 3 Neck: No JVD, no carotid bruits; normal carotid upstroke Lungs: Scattered rhonchi with mild end expiratory wheezing Chest wall: without tenderness to palpitation Heart: PMI not displaced, RRR, s1 s2 normal, 2/6 systolic murmur, no diastolic murmur, no rubs, gallops, thrills, or heaves Abdomen: soft, nontender; no hepatosplenomehaly, BS+; abdominal aorta nontender and not  dilated by palpation. Back: no CVA tenderness Pulses 2+ Musculoskeletal: full range of motion, normal strength, no joint deformities Extremities: no clubbing cyanosis or edema, Homan's sign negative  Neurologic: grossly nonfocal; Cranial nerves grossly wnl Psychologic: Normal mood and affect  ECG (independently read by me): Sinus rhythm with PACs in an atrial bigeminal pattern  November 2020 ECG (independently read by me): Sinus rhythm with suggestion of intermittent second-degree Mobitz type I block.  Left axis deviation.  Nonspecific interventricular block.  QTc interval 435 ms.  September 2019 ECG (independently read by me): Sinus rhythm with sinus arrhythmia, isolated PVC.  Left anterior hemiblock.  Early transition with prominent R wave in V1 and V2  September 2018 ECG (independently read by me): sinus bradycardia 51 bpm.  Left axis deviation.  Inferior Q waves with early transition.  Normal intervals.  April 2017 ECG (independently read by me): Normal sinus rhythm with PACs.  Heart rate 62 bpm.  Inferior posterior MI  November 2016 ECG (independently read by me):  Normal sinus rhythm at 62 bpm.   Incomplete right bundle branch block.  Prior ECG (independently read by me): Sinus bradycardia at 58 bpm with an isolate a PVC and occasional PACs with mild sinus arrhythmia.  Evidence for prior inferior posterior infarct.  Nonspecific ST changes.  Prior 11/05/2013 ECG (independently read by me): Sinus bradycardia at 49 beats per minute.  QTc interval 426 ms.  PR interval 174 ms.  Mild RV conduction delay  Prior 04/26/2013 ECG: Marked sinus bradycardia 43 beats per minute. Left axis deviation. 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  LABS:  BMP Latest Ref Rng & Units 05/05/2018 04/10/2018 11/28/2017  Glucose 70 - 99 mg/dL 105(H) 102(H) 115(H)  BUN 8 - 23 mg/dL 25(H) 17 23  Creatinine 0.61 - 1.24 mg/dL 1.50(H) 1.07 1.07  BUN/Creat Ratio 10 - 24 - 16 -  Sodium  135 - 145 mmol/L 140 138 137  Potassium 3.5 - 5.1 mmol/L 3.9 4.2 3.8  Chloride 98 - 111 mmol/L 102 99 104  CO2 20 - 29 mmol/L - 21 23  Calcium 8.6 - 10.2 mg/dL - 9.8 10.2   Hepatic Function Latest Ref Rng & Units 06/22/2019 12/10/2018 04/10/2018  Total Protein 6.0 - 8.3 g/dL 7.9 7.7 7.6  Albumin 3.5 - 5.2 g/dL 4.1 4.4 4.4  AST 0 - 37 U/L 16 17 13  ALT 0 - 53 U/L 9 7 6  Alk Phosphatase 39 - 117 U/L 65 63 74  Total Bilirubin 0.2 - 1.2 mg/dL 0.4 0.4 0.4  Bilirubin,   Direct 0.0 - 0.3 mg/dL 0.0 0.1 -   CBC Latest Ref Rng & Units 05/05/2018 08/25/2017 03/25/2017  WBC 4.0 - 10.5 K/uL - 8.1 9.9  Hemoglobin 13.0 - 17.0 g/dL 15.0 15.3 15.9  Hematocrit 39.0 - 52.0 % 44.0 46.2 47.9  Platelets 150 - 400 K/uL - 144(L) 157.0   Lab Results  Component Value Date   MCV 95.7 08/25/2017   MCV 94.0 03/25/2017   MCV 91.1 01/21/2017   No results found for: HGBA1C  Lab Results  Component Value Date   TSH 2.870 04/10/2018   Lipid Panel     Component Value Date/Time   CHOL 109 04/10/2018 0827   CHOL 118 05/05/2013 0839   TRIG 106 04/10/2018 0827   TRIG 202 (H) 05/05/2013 0839   HDL 45 04/10/2018 0827   HDL 38 (L) 05/05/2013 0839   CHOLHDL 2.4 04/10/2018 0827   CHOLHDL 2.6 05/20/2014 0823   VLDL 27 05/20/2014 0823   LDLCALC 43 04/10/2018 0827   LDLCALC 40 05/05/2013 0839     RADIOLOGY: No results found.  Cardiac Studies: The patient wore an event monitor from December 10 through June 30, 2019.  The predominant rhythm was sinus rhythm with an average rate at 65 bpm.  The slowest heart rate was sinus bradycardia at 47 bpm which occurred on December 14 at 12:50 AM.  The fastest heart beat was sinus tachycardia on December 15 which occurred at 8:23 AM.  The patient had several episodes of atrial fibrillation with multifocal PVCs, as well as atrial flutter with variable block with PVCs which were auto triggered. The  minimum heart rate in A. fib was 56 bpm with maximum heart rate 97 bpm.  There  were no instances of heart block, prolonged pauses.  There was an isolated multifocal atrial couplet but no episodes of VT.  IMPRESSION: 1. CAD in native artery   2. Hx of CABG   3. Hypotension due to drugs   4. PAD (peripheral artery disease) ([facility])   5. Mixed hyperlipidemia   6. Paroxysmal atrial fibrillation ([facility])   7. Paroxysmal atrial flutter ([facility])   8.  Aortic stenosis   9. Long term (current) use of anticoagulants   10. OSA (obstructive sleep apnea)   11. IPF (idiopathic pulmonary fibrosis) ([facility])     ASSESSMENT AND PLAN: Troy Miller is a 78-year-old Caucasian male who has CAD dating back to 1994 when he underwent his initial CABG surgery and required redo bypass surgery in 2005. He has peripheral vascular disease.   His last nuclear study revealed inferior scar without associated ischemia. 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  He is status post remote right fem-tib bypass graft surgery in 1996. He is followed  by Dr. Brabham.  I reviewed his most recent peripheral vascular Doppler studies which showed patent stents in the right proximal SFA, right proximal SFA to peroneal  artery bypass graft as well as left proximal SFA to proximal TPT stent.  I reviewed his most recent 2-week Holter monitor which demonstrated predominant sinus rhythm but he was found to have several episodes of atrial flutter and atrial fibrillation with PVCs.  Fastest A. fib rate was 97 bpm with longest duration at 21 minutes.  He is on warfarin anticoagulation.  His blood pressure today is low at 82/55 supine and 80/56 standing 1 checked   by me.  I have recommended he discontinue valsartan HCT 80/12.5 mg and in its place I will initiate valsartan 40 mg daily initially (he will break it 80 mg pill in half) he had received a new CPAP machine from the  [place].  His last use was July 05, 2019.  I strongly recommended resumption of therapy.  He is followed by Dr. Chase Caller for his interstitial lung disease and has history of asbestosis exposure remotely.  He will be seeing his primary provider next week with Dr. Chase Caller in 1 month.  I am recommending a follow-up echo Doppler study to reassess his systolic and diastolic function as well as aortic stenosis.  He continues to be on simvastatin for hyperlipidemia with target LDL less than 70.  I will see him in April for follow-up evaluation or sooner as needed.  Time spent: 25 minutes  Troy Sine, MD, [facility]  08/29/2019 11:26 AM

## 2019-08-26 ENCOUNTER — Telehealth: Payer: Self-pay | Admitting: Internal Medicine

## 2019-08-26 NOTE — Telephone Encounter (Signed)
Last seen sept 2019. Has  ILD/UIP with asbestos plaques. No further followup after that. Last CT March 2020. Last PFT June 2020 - and already with significant decline. ast LFT dec 2020 and stable  Plan  - April 2021 - spiro/.dlco  -lapril 2021 - do LFT  - return to see me in April 2021 - 15 min slot (if delays with PFT just bring them in)

## 2019-08-29 ENCOUNTER — Encounter: Payer: Self-pay | Admitting: Cardiovascular Disease

## 2019-09-07 NOTE — Telephone Encounter (Signed)
PFTs are booked out until May. It would work out best for [place] to get him in for the OV with MR and then decide on the PFT.  Called and spoke with pt about message from MR. Pt has been scheduled for appt with MR Thurs. April 15. Nothing further needed.

## 2019-09-17 ENCOUNTER — Other Ambulatory Visit: Payer: Self-pay | Admitting: Internal Medicine

## 2019-10-18 ENCOUNTER — Ambulatory Visit (HOSPITAL_COMMUNITY): Payer: Medicare Other | Attending: Internal Medicine

## 2019-10-18 ENCOUNTER — Other Ambulatory Visit: Payer: Self-pay

## 2019-10-18 DIAGNOSIS — E782 Mixed hyperlipidemia: Secondary | ICD-10-CM | POA: Diagnosis present

## 2019-10-18 DIAGNOSIS — Z951 Presence of aortocoronary bypass graft: Secondary | ICD-10-CM

## 2019-10-18 DIAGNOSIS — I739 Peripheral vascular disease, unspecified: Secondary | ICD-10-CM

## 2019-10-18 DIAGNOSIS — I2510 Atherosclerotic heart disease of native coronary artery without angina pectoris: Secondary | ICD-10-CM

## 2019-10-21 ENCOUNTER — Ambulatory Visit (INDEPENDENT_AMBULATORY_CARE_PROVIDER_SITE_OTHER): Payer: Medicare Other | Admitting: Internal Medicine

## 2019-10-21 ENCOUNTER — Other Ambulatory Visit: Payer: Self-pay

## 2019-10-21 DIAGNOSIS — J84112 Idiopathic pulmonary fibrosis: Secondary | ICD-10-CM

## 2019-10-21 DIAGNOSIS — I2510 Atherosclerotic heart disease of native coronary artery without angina pectoris: Secondary | ICD-10-CM

## 2019-10-21 NOTE — Progress Notes (Signed)
OV 05/12/2017  Chief Complaint  Patient presents with  . Follow-up    Pt states that he is doing good. C/o SOB with exertion, occ. cough with clear to white mucus. Denies any CP.   Follow-up idiopathic pulmonary fibrosis started on Pirfenidone (Esbriet) and June 2018.  Follow left lower lobe lung nodule 8 mm in February 2018   He is here for routine follow-up. Last visit he did see my colleague and noticed some increased shortness of breath but currently this is settled. He is on full dose Pirfenidone (Esbriet) and is tolerating it well without any problems. He is interested in enrolling into the 1 pill format. He denies any worsening shortness of breath or cough that he does have shortness of breath with exertion relieved by rest but this is stable. His most recent liver function test was in September 2018 and normal. His creatinine was slightly elevated at 1.2 mg percent at that time. He is interested in research protocols in the future. He lives one hour away and it would be easier for him to have liver function tests monitoring locally he's not had a follow-up CT scan of the chest for his left lower lobe nodule noted in August 2080 needed have a CT angiogram that showed his lung nodule is stable compared to February 2018.    OV 08/01/2017  Chief Complaint  Patient presents with  . Follow-up    HRCT done 11/13, PET scan done 12/12, and PFT done today. Pt is currently on Esbriet. Pt states he has been doing about the same as last visit.  Occ. cough, SOB with exertion. Denies any CP.     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 Followup RLL lung nodule  In terms of his IPF: He is doing well clinically.  He says he is stable.  However when we walked him he desaturated on the second lap.  In fact pulmonary function test shows a decline.  But he is not feeling it.  He does have portable oxygen with him at home and he does not  use it.  Liver function tests in January 2019 on the seventh was normal.  He did this outside with his primary care physician and he brought the results with me for review.  At this point in time he finished 6 months of liver function test.  After this he will have liver function test every 3 months  Lung nodule: His CT scan recently showed right lower lobe lung nodule.  He had a follow-up PET scan and this shows the lung nodule to be PET hot.  I visualized the scan with Dr. Baltazar Apo and it is too far for bronchoscopy means.  The pretest probability for this being early stage non-small cell lung cancer is extremely high over 95% in my view.    Walking desaturation test on 08/01/2017 185 feet x 3 laps on ROOM AIR:  did YES desaturate. Rest pulse ox was 98%, final pulse ox was 85 at% at 2nd lap and stopped and started on 2L [place] an stayed at 96% for remainder. HR response 50/min at rest to 90 /min at peak exertion at 2nd lap when desaturated. Patient Artis A Dedeaux  Yes did Desaturate < 88% . Kate A Meth yes  Desaturated </= 3% points. Derl A Parslow yes did get tachyardic    OV 11/28/2017    Chief Complaint  Patient presents with  . Acute Visit  Pt has c/o SOB and cough with yellow mucus. Pt started on pred taper and abx 5/22.Pt states he hs had some indigestion after taking the doxy. Pt states since started on doxy and pred, SOB has improved. Pt is also still coughing but states mucus is turning clear now.   Wen A Dhingra  presents for follow-up of his idiopathic pulmonary fibrosis and therapeutic monitoring.  He is on Esbriet.  As of January 2019 he was having slow decline in lung function.  But he was tolerating the Pirfenidone (Esbriet) just fine.  At that time he had a PET heart right lower lobe nodule and I referred him to oncology.  Review of the notes indicate that he had 3 settings of radiation therapy and he was doing well after that.  He has a follow-up pending with  radiation oncology.  On Nov 26, 2017 he called our office with acute bronchitis exacerbation and we gave him doxycycline and prednisone.  He is midway through it and is beginning to feel a lot better and is close to baseline.  He did have some nausea and stomach irritation with the doxycycline.  Review of the medication list shows that he is on Niaspan for his hyperlipidemia and also Pirfenidone (Esbriet) for his IPF both of which are prone for GI side effects although at baseline he is never had any problems with those.  At this point in time he is overall feeling good.  He uses oxygen at night and periodically on a subjective basis with exertion   OV 12/30/2017  Chief Complaint  Patient presents with  . Follow-up    Pt states he is about the same as he was at last visit. Pt still becomes SOB, has cough with mostly clear mucus, and sometimes has occ. CP/chest tightness.   Gerda Diss Gloster , 79 y.o. , with dob 09-Feb-1941 and male ,Not Hispanic or Latino from [place] [id number] - presents to [facility]  clinic for IPF followup on Esbreit  In terms of IPF: He is overall stable. He tells me that he takes his aspirin regularly. Does not much of nausea. He is tolerating it fine. He apply sunscreen. Uses oxygen at night. He uses oxygen only at day time with exertion but on a subjective basis even though he desaturated quickly at the end of second lap in the office is documented below which is similar to before. His last liver function was May 2019 in stable. I personally review that result with a normal CK of 55 and AST are 16 and ALT of 12.  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 Radiation was in spring 2019. He is now having for the last several weeks to a few months new onset right lower lobe chest pain that is musculoskeletal. It happens randomly in the daytime particularly when he moves but he is not associating it with a  body movement is no radiation. The symptoms are mild to moderate in intensity. Is no clear cut aggravating or relieving factors. It is not present at night when he sleeping. This no weight loss or hemoptysis. Last imaging was in January 2019 at the time of diagnosis of cancer. His follow-up with radiation oncology on review of the chartshows its on 01/26/2018.     OV 03/31/2018  Subjective:  Patient ID: Troy Miller, male , DOB: 12-04-1940 , age 79 y.o. , MRN: [id number] , ADDRESS: [address] [place] [address]   03/31/2018 -   Chief Complaint  Patient presents with  . Follow-up    PFT performed today. Pt still becomes SOB when he exerts himself, has occ cough with clear to yellow mucus. Denies any CP.     - Follow-up idiopathic pulmonary fibrosis started on Pirfenidone (Esbriet)  - sinceeMay/June 2018.  - [place] RLL lung cancer - s/p XRT Feb 2019 - CT June 2019 - withj 48m LLL new nodule  HPI JChaunceyA Square 79y.o. -follows up IPF.  He is on Esbriet and is tolerating this well.  Although for the last several months he tells me that he had nonspecific itching that is moderate in severity.  He is wondering if it is because of Pirfenidone (Esbriet) but he is on many medications.  He does have dry skin.  He does not use Vaseline.  He continues to be on Niaspan for hyperlipidemia along with statin.  He has an upcoming cardiology appointment.  In terms of his IPF he feels stable.  His walking desaturation test is stable.  His lung function shows a mild decline but then looking at the flow volume loop it appears he has coughed significantly.  He does use oxygen as needed with exertion but is not fully regular with it.  He does not have any other GI side effects or sound side effects of Pirfenidone (Esbriet).  He does apply sunscreen regularly.  There is no rash anywhere.  New issue: He had a CT scan June 2019 and during post radiation surveillance a 5 mm left lower lobe nodule was  discovered this is new.   OV 10/21/2019 - telephone visit, 2 person identifier used, Risk benefit limitation of tele visit  Subjective:  Patient ID: JVivi Miller male , DOB: 7March 01, 1942, age 79y.o. , MRN: [id number], [address] [id number]   - Follow-up idiopathic pulmonary fibrosis started on Pirfenidone ([place]  - sinceeMay/June 2018.  - [place] RLL lung cancer - s/p XRT Feb 2019 - CT June 2019 - withj 51mLLL new nodule  10/21/2019 -  Telephone visit  HPI JaLamarcus Spiraunsucker [age].o. -on this telephone visit reports that he slowly getting worse in terms of dyspnea.  He uses oxygen with exertion and at night but not at rest.  Otherwise he is feeling fine.  He tolerates his Esbriet fine.  He had a high-resolution CT chest that shows progression.  I personally last saw him in 2019.  In the interim he saw a nuDesigner, jewellery PFTs also show progression.   Results for HUJACEON, HEIBERGERMRN [id number] of 10/21/2019 10:55  Ref. Range 05/03/2016 12:52 12/09/2016 15:22 08/01/2017 10:09 03/31/2018 14:03 12/10/2018 14:48  FVC-Pre Latest Units: L 3.70 3.84 3.53 3.35 2.99  FVC-%Pred-Pre Latest Units: % 85 89 82 79 70  Results for HUJACKSON, COFFIELDMRN [id number] of 10/21/2019 10:55  Ref. Range 05/03/2016 12:52 12/09/2016 15:22 08/01/2017 10:09 03/31/2018 14:03 12/10/2018 14:48  DLCO unc Latest Units: ml/min/mmHg 12.45 11.70 10.88 10.69 8.42  DLCO unc % pred Latest Units: % 36 34 32 31 33     Simple office walk 185 feet x  3 laps goal with forehead probe 11/28/2017  12/30/2017  03/31/2018   O2 used Room air Room air Room air  Number laps completed 1 2 - stopeed at end due to desat 2 - stopped at end of 2 lapst due to desat  Comments  about pace Normal pace slow Normal pace  Resting Pulse Ox/HR 93% and 63/min 96%/61/min     97% and 76/min  Final Pulse Ox/HR 84% and 91/min 87% and 97/min 86% and 91/min  Desaturated </= 88% yes yes ues  Desaturated <= 3% points yes yes ues   Got Tachycardic >/= 90/min yes yes yes  Symptoms at end of test x x Mild dyspnea  Miscellaneous comments desatruiated in 1 lap, corrected with 2L [place] at 1 lap desat after 2 laps - same ass before Similar to before    IMPRESSION: HRCT 1. Evolving radiation fibrosis in the posterior right lower lobe, without evidence of local tumor recurrence. 2. No findings highly suspicious for metastatic disease in the chest. Mild right hilar adenopathy is stable and potentially reactive. Several scattered solid left pulmonary nodules are stable since 2018 and probably benign. No new significant pulmonary nodules. 3. 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 [place], Iss 5, [phone], Mar 08 2017. 4. Mild cardiomegaly. 5. Stable calcified bilateral pleural plaques and smooth pleural thickening without significant pleural effusions, compatible with asbestos related pleural disease.  Aortic Atherosclerosis (ICD10-I70.0) and Emphysema (ICD10-J43.9).   Electronically Signed   By: Ilona Sorrel M.D.   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   reports that he quit smoking about 33 years ago. His smoking use included cigarettes. He has a 50.00 pack-year smoking history. He has never used smokeless tobacco.  Past Surgical History:  Procedure Laterality Date  .  ABDOMINAL AORTOGRAM W/LOWER EXTREMITY N/A 02/18/2017   Procedure: ABDOMINAL AORTOGRAM W/LOWER EXTREMITY;  Surgeon: Serafina Mitchell, MD;  Location: [facility];  Service: Cardiovascular;  Laterality: N/A;  . ABDOMINAL AORTOGRAM W/LOWER EXTREMITY N/A 05/05/2018   Procedure: ABDOMINAL AORTOGRAM W/LOWER EXTREMITY;  Surgeon: Serafina Mitchell, MD;  Location: [facility];  Service: Cardiovascular;  Laterality: N/A;  . BACK SURGERY  2003  . CARDIAC ELECTROPHYSIOLOGY STUDY AND ABLATION  03/19/10  . CARDIOVERSION  08/16/08  . CHOLECYSTECTOMY  08/13/02  . CORONARY ARTERY BYPASS GRAFT  1994 & 2005  . FEMORAL-TIBIAL BYPASS GRAFT     Remote  . HERNIA REPAIR  06/03/07  . PERIPHERAL VASCULAR INTERVENTION Left 02/18/2017   Procedure: PERIPHERAL VASCULAR INTERVENTION;  Surgeon: Serafina Mitchell, MD;  Location: [facility];  Service: Cardiovascular;  Laterality: Left;  POPLITEAL  . PERIPHERAL VASCULAR INTERVENTION Bilateral 05/05/2018   Procedure: PERIPHERAL VASCULAR INTERVENTION;  Surgeon: Serafina Mitchell, MD;  Location: [facility];  Service: Cardiovascular;  Laterality: Bilateral;  Rt fem/pop graft and Lt popliteal  . VASECTOMY  01/28/08    Allergies  Allergen Reactions  . Fentanyl   . Fentanyl And Related Other (See Comments)    Hallucinations    Immunization History  Administered Date(s) Administered  . Influenza Split 04/20/2014  . Influenza, High Dose Seasonal PF 05/17/2016, 05/12/2017, 04/08/2019  . Influenza,inj,Quad PF,6+ Mos 03/10/2015  . Influenza-Unspecified 06/02/2012  . Pneumococcal Conjugate-13  02/23/2016    Family History  Problem Relation Age of Onset  . Lung cancer Mother        smoked  . Heart failure Father   . Stroke Sister      Current Outpatient Medications:  .  famotidine (PEPCID) 20 MG tablet, TAKE 1 TABLET BY MOUTH EACH NIGHT AT BEDTIME AS DIRECTED, Disp: 90 tablet, Rfl: 0 .  pantoprazole (PROTONIX) 40 MG tablet, TAKE 1 TABLET BY MOUTH ONCE  DAILY 30 TO 60 MINUTES BEFORE FIRST MEAL OF THE DAY, Disp: 90 tablet, Rfl: 0 .  acetaminophen (TYLENOL) 500 MG tablet, Take 1,000 mg by mouth every 6 (six) hours as needed for moderate pain or headache. , Disp: , Rfl:  .  aspirin 81 MG tablet, Take 81 mg by mouth daily., Disp: , Rfl:  .  calcium carbonate (OS-CAL) 600 MG TABS, Take 600 mg by mouth 2 (two) times daily with a meal., Disp: , Rfl:  .  Cholecalciferol (VITAMIN D) 2000 units CAPS, Take 2,000 Units by mouth daily., Disp: , Rfl:  .  finasteride (PROSCAR) 5 MG tablet, Take 5 mg by mouth daily., Disp: , Rfl:  .  folic acid (FOLVITE) 1 MG tablet, Take 1 mg by mouth every evening. , Disp: , Rfl:  .  hydrOXYzine (ATARAX/VISTARIL) 25 MG tablet, Take 25 mg by mouth 3 (three) times daily., Disp: , Rfl:  .  niacin (NIASPAN) 500 MG CR tablet, Take 2,000 mg by mouth every evening. , Disp: , Rfl:  .  oxyCODONE-acetaminophen (PERCOCET) 10-325 MG tablet, Take 1 tablet by mouth 3 (three) times daily as needed for severe pain., Disp: , Rfl:  .  Pirfenidone (ESBRIET) 267 MG TABS, Take 3 tablets (801 mg total) by mouth 3 (three) times daily., Disp: 810 tablet, Rfl: 5 .  simvastatin (ZOCOR) 20 MG tablet, Take 1 tablet (20 mg total) by mouth daily at 6 PM., Disp: 90 tablet, Rfl: 2 .  tamsulosin (FLOMAX) 0.4 MG CAPS capsule, Take 0.4 mg by mouth daily., Disp: , Rfl:  .  valsartan (DIOVAN) 80 MG tablet, Take 0.5 tablets (40 mg total) by mouth daily. TAKE 1/2 TABLET (40MG DAILY)., Disp: 90 tablet, Rfl: 3 .  warfarin (COUMADIN) 5 MG tablet, Take 1 tablet by mouth daily or as directed by PCP, Disp: 45 tablet, Rfl: 0      Objective:   There were no vitals filed for this visit.  Estimated body mass index is 25.26 kg/m as calculated from the following:   Height as of 08/23/19: 5' 10.5" (1.791 m).   Weight as of 08/23/19: 178 lb 9.6 oz (81 kg).  _0 @  There were no vitals filed for this visit.   Physical Exam Sounded fine on the phone        Assessment:       ICD-10-CM   1. IPF (idiopathic pulmonary fibrosis) ([facility])  J84.112        Plan:     Patient Instructions  IPF (idiopathic pulmonary fibrosis) ([facility])  - progressive   plan Continue esbriet 3 times daily with meals Continue on Oxygen 3l/m with exertino and night Restage disease  - check spirometry and dlco next 4-8 weeks Activity as tolerated.      Follow up :  4-8 weeks with Dr. Chase Caller to discuss future care option  - clinical trial and/or rotation to ofev if continued progression present     SIGNATURE    Dr. Brand Males, M.D., F.C.C.P,  Pulmonary and Critical Care  Medicine Staff Physician, Advance Director - Interstitial Lung Disease  Program  Pulmonary [place] [facility] [place], [place], [address]  Pager: [phone], If no answer or between  15:00h - 7:00h: call [phone] Telephone: [phone]  11:11 AM 10/21/2019

## 2019-10-21 NOTE — Patient Instructions (Addendum)
IPF (idiopathic pulmonary fibrosis) ([facility])  - progressive   plan Continue esbriet 3 times daily with meals Continue on Oxygen 3l/m with exertino and night Restage disease  - check spirometry and dlco next 4-8 weeks Activity as tolerated.      Follow up :  4-8 weeks with Dr. Chase Caller to discuss future care option  - clinical trial and/or rotation to ofev if continued progression present

## 2019-10-25 ENCOUNTER — Other Ambulatory Visit: Payer: Self-pay

## 2019-10-25 ENCOUNTER — Ambulatory Visit (INDEPENDENT_AMBULATORY_CARE_PROVIDER_SITE_OTHER): Payer: Medicare Other | Admitting: Cardiovascular Disease

## 2019-10-25 ENCOUNTER — Encounter: Payer: Self-pay | Admitting: Cardiovascular Disease

## 2019-10-25 VITALS — BP 98/62 | HR 107 | Ht 70.5 in | Wt 182.0 lb

## 2019-10-25 DIAGNOSIS — I255 Ischemic cardiomyopathy: Secondary | ICD-10-CM

## 2019-10-25 DIAGNOSIS — I350 Nonrheumatic aortic (valve) stenosis: Secondary | ICD-10-CM

## 2019-10-25 DIAGNOSIS — Z951 Presence of aortocoronary bypass graft: Secondary | ICD-10-CM | POA: Diagnosis not present

## 2019-10-25 DIAGNOSIS — I739 Peripheral vascular disease, unspecified: Secondary | ICD-10-CM

## 2019-10-25 DIAGNOSIS — I2510 Atherosclerotic heart disease of native coronary artery without angina pectoris: Secondary | ICD-10-CM

## 2019-10-25 DIAGNOSIS — I480 Paroxysmal atrial fibrillation: Secondary | ICD-10-CM

## 2019-10-25 DIAGNOSIS — J84112 Idiopathic pulmonary fibrosis: Secondary | ICD-10-CM

## 2019-10-25 NOTE — Patient Instructions (Signed)
Medication Instructions:  CONTINUE WITH CURRENT MEDICATIONS. NO CHANGES.  *If you need a refill on your cardiac medications before your next appointment, please call your pharmacy  Follow-Up: Please notifiy our office with your decision and we will make arrangements from there!

## 2019-10-25 NOTE — Progress Notes (Signed)
Patient ID: Troy Miller, male   DOB: 12-15-1940, 79 y.o.   MRN: [id number]     HPI: Troy Miller is a 79 y.o. male who presents for a 2 month cardiology and sleep evaluation.   Troy Miller has a history of CAD and in 1994 underwent initial CABG revascularization surgery. In May 2005 he underwent redo surgery with a RIMA to the LAD, vein graft to distal RCA by Troy Miller. His previous LIMA graft was preserved and supplied the OM1 vessel. He has history of atrial flutter status post ablation by Troy Miller as well as a history of PAF. He has documented peripheral vascular disease with abdominal aortic aneurysm, as well as common iliac artery aneurysms. He status post right fem-tib bypass graft surgery. Additional problems include hypertension as well as mixed hyperlipidemia. He also has mild aortic stenosis and documentation of an infrarenal abdominal aortic aneurysm.   A followup echo Doppler study on 05/11/2013 showed an ejection fraction in the 45-50% range with mild inferior hypokinesis.  There was grade 1 diastolic dysfunction.  His aortic valve was moderately calcified and there was evidence for mild aortic valve stenosis with a peak gradient of 19 and mean gradient of 10. Aortic valve area was 1.5 1.64 cm.  There was mild aortic root dilatation at 42 mm mild dilatation of the ascending aorta..  There was mitral regurgitation.  A follow-up abdominal aortic Doppler study on April 29, 2014 demonstrated an infrarenal fusiform aneurysm at 3.63.7 with mild amount of atherosclerosis visualized throughout.  The right and left proximal common iliac arteries also demonstrated aneurysmal dilatation.  The right common iliac artery measured 2.3.  A 2.3 without evidence for stenosis.  Left common iliac artery measured 3.2 x 3.0 with mild amount of thrombus and low flow velocity.  He underwent a follow-up ultrasound last week which showed slight progression.  His aneurysm in the distal aorta, now  measured 4.03.7.  There was a new finding of a right common iliac artery aneurysm measuring 2.2 by 2.3 cm.  The left common iliac artery was stable at 3.2 x 3 point 0 cm.  The IVC veins were patent.  A follow-up evaluation was recommended in one year.  A follow-up echo Doppler study on 11/04/2014 demonstrated an EF 50-55% without regional wall motion abnormalities.  There was evidence for mild aortic valve stenosis valve area of 1. 4 9 and 1.43 by measurements. Mean gradient was 14 and peak gradient 32 mmHg.  He saw Troy Miller with complaints of chest discomfort in 2016.  A nuclear perfusion study was  on 02/10/2015:  Ejection fraction was 47%. There was evidence for prior inferior perfusion defect suggesting prior infarction.  There was no ischemia.  He underwent several treatment of skin lesions on his face by his dermatologist and will need additional treatment for 1.  On his nose which he states was positive for mild malignancy. He denies anginal symptoms.  He denies PND, orthopnea.  He denies bleeding.   Troy Miller has a history of obstructive sleep apnea dating back to 2005.  His initial sleep study showed severe sleep apnea with an AHI of 33.9, and in addition to obstructive events had central events.  Initial O2 desaturation was 81%.  Remotely he had seen Troy Miller, he had undergone a trial of BiPAP, but reportedly had felt better with CPAP therapy.  He has not used CPAP therapy for at least 3-4 years.  His DME company had been [employer] which is  no longer in business.  He admits to daytime sleepiness.  He does snore.  He denies any recent CHF symptomatology.   I referred him for a new sleep study which was done on 12/22/2015 and confirmed moderate sleep apnea with an AHI overall of 26 per hour.  However, sleep apnea was severe during REM sleep with an AHI 51.4.  He had significant oxygen desaturation to 82% in time below 88% was 76.4 minutes.  There was moderate snoring.  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  He received  a new CPAP machine.  He admits to 100% compliance.  His sleep has improved.  He denies daytime sleepiness and is unaware of breakthrough snoring.  I  saw him in September 2018 was also being seen by the [place].Marland Kitchen  Remotely he developed cough secondary to ace inhibition.  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  Occasionally he require supplemental oxygen if he is walking but typically does not use oxygen routinely.    An echo Doppler study from December 26, 2017 showed an EF of 35 to 40% with to mid inferolateral inferior inferoseptal mild hypokinesis.  There was grade 1 diastolic dysfunction.  There was mild aortic stenosis with a mean gradient of 21 and peak gradient of 31.  The ascending aorta was felt to be mildly dilated.  His RV was mildly dilated.  He had mild increased PA pressure at 34 mmHg. He underwent abdominal aortic Doppler to assess his abdominal aortic aneurysm on January 16, 2018  which showed the largest diameter 3.7 cm, essentially unchanged from prior exams.  Lower extremity duplex showed normal staying ABIs at his right toe brachial index was abnormal as well as his left toe brachial index.  He is being evaluated by Troy Miller for intersitital pulmonary fibrosis and was started on Esbriet.  His LFTs have been normal.  He has a history of significant mixed hyperlipidemia and for this reason had been on combination niacin with statin.  ALT on March 31, 2018 was normal at 7 and AST was 13.  He previously had been on omega-3 fatty acids but apparently these were discontinued by Troy Miller according to the patient because of his asbestosis.  He had not had any recent lipid studies.   He underwent lower extremity PV angiography and intervention by Troy Miller on May 05, 2018 which demonstrated aneurysmal degeneration of the infrarenal  abdominal aorta and bilateral iliac arteries. The right common femoral profundofemoral artery were heavily calcified but patent.  The SFA was occluded.  A bypass graft originating from the SFA had a proximal stenosis and luminal irregularity at the anastomosis. Runoff was to the peroneal artery.  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  He underwent stent to the right SFA and a stent to the left popliteal artery.  He was evaluated in a telemedicine visit on October 26, 2018.  He believes his blood pressure has been controlled.  He continues to be on valsartan/HCTZ 80/12.5. Recent lipid studies were excellent on his current regimen with simvastatin and niacin with total cholesterol 109 triglycerides 106 HDL 45 LDL 43.  He had been on Xarelto 20 mg and has a remote history of atrial fibrillation ablation.  He has been maintaining sinus rhythm.  Unfortunately  the cost of Xarelto was too much and he was having to pay $266 per month out of pocket.  As result he was switched to warfarin Last week he had called the office and was to warfarin.    Since his April telemedicine evaluation, he believes he has done fairly well. He is on supplemental oxygen 2 L/min at home.  However he has a portable oxygen unit which he takes when he walks and typically it is set at 5 L/min.  He is followed by Dr. Chase Caller.  He underwent high-resolution CT of his chest in follow-up of his interstitial lung disease and stage I right lower lobe lung cancer treated with radiation therapy concluded in March 2019.  The September 17, 2018 CT demonstrated evolving radiation fibrosis in the posterior right lower lobe without evidence for local tumor recurrence.  He had basilar predominant fibrotic interstitial lung disease with mild honeycombing slightly progressed since 2018.  There also was stable calcified bilateral pleural plaques and smooth pleural thickening without  effusion.  He underwent follow-up vascular imaging studies in July and a Doppler of his abdominal aorta showed dilation of the distal aorta now measuring 3.9 cm compared to 3.6 cm in July 2019.  There was mild dilation of the right common iliac and left common iliac arteries.  Lower extremity Doppler study showed widely patent right proximal SFA stent without restenosis and a widely patent right proximal SFA to proximal peroneal artery bypass graft without restenosis.  The distal left CFA measured 1.8 cm.  There was a widely patent left proximal SFA to proximal TPT stent without stenosis.  He denies any anginal symptoms.  He does get short of breath with walking.  An echo Doppler study and profile showed mild improvement in LV function with EF now at 40 to 45%.  Aortic stenosis was moderate with a peak gradient of 33, mean gradient of 23, and aortic valve area of 1.3 cm.  He admits to occasional palpitations but denies presyncope.    I last saw him on August 23, 2019.  Since his prior evaluation in November 2020 he had worn  a cardiac monitor for 2 weeks.  This demonstrated predominant sinus rhythm with an average heart rate at 65 bpm.  The slowest heart rate was sinus bradycardia at 47 which occurred at 12:50 AM on June 21, 2019 and sinus tachycardia at 111 bpm at which occurred at 8:23 AM on December 15.  He was found to have 4 episodes of arrhythmia with 2 episodes of atrial fibrillation with multifocal PVCs, and 2 episodes of atrial flutter with variable block.  He is now on supplemental oxygen.  He has a long history of prior asbestosis exposure.  He had not been using his CPAP.  He received a new machine from the [place].  He is on warfarin anticoagulation.  He has been on valsartan HCT 80/12.5 for hypertension.  He is on finasteride and tamsulosin for prostate issues.  He is on Esbriet for his interstitial lung disease.  During his evaluation with me, his blood pressure was low at 82/55 supine and 80/56  standing and I recommended he discontinue valsartan HCT 80/12.5 mg daily and in its place initiated valsartan 40 mg alone.  He was evaluated by Dr. Chase Caller in April 2021 for his idiopathic pulmonary fibrosis and he   He underwent a follow-up echo Doppler study on October 18, 2019.  EF is further depressed now at 20 to 25% and there was global hypokinesis.  Left ventricular internal dimensions were internal dimensions were  dilated at 6.3 cm at end diastole and 5.0 cm and in systole.  Aortic root diameter was 4.1 cm.  It is in the low EF, peak and mean gradients were 26 and 12 mm respectively.  His LVOT/AV VTI ratio was 0.22 consistent with severe AS.  He denies any chest pain.  He continues to use supplemental oxygen. He presents for evaluation.  Past Medical History:  Diagnosis Date  . AAA (abdominal aortic aneurysm) ([place])   . Aortic stenosis, mild    07/10/12 EF 50-55% on echo-mitral annular ca+ also  . Atrial fibrillation ([place])    03/19/10 ablation-[name]  . Atrial flutter ([place])   . CAD (coronary artery disease)   . DVT (deep venous thrombosis) ([place])   . History of stress test    Myoview 8/16:  EF 47%, inferior scar, no ischemia, Intermediate risk (low EF)  . Hyperlipemia   . Hypertension   . Iliac artery aneurysm ([place])   . PVD (peripheral vascular disease) ([place])    remote right fem-tib bypass graft sugery  . 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 ABDOMINAL AORTOGRAM W/LOWER EXTREMITY N/A 02/18/2017   Procedure: ABDOMINAL AORTOGRAM W/LOWER EXTREMITY;  Surgeon: Serafina Mitchell, MD;  Location: [facility];  Service: Cardiovascular;  Laterality: N/A;  . ABDOMINAL AORTOGRAM W/LOWER EXTREMITY N/A 05/05/2018   Procedure: ABDOMINAL AORTOGRAM W/LOWER EXTREMITY;  Surgeon: Serafina Mitchell, MD;  Location: [facility];  Service: Cardiovascular;  Laterality: N/A;  . BACK SURGERY  2003  . CARDIAC  ELECTROPHYSIOLOGY STUDY AND ABLATION  03/19/10  . CARDIOVERSION  08/16/08  . CHOLECYSTECTOMY  08/13/02  . CORONARY ARTERY BYPASS GRAFT  1994 & 2005  . FEMORAL-TIBIAL BYPASS GRAFT     Remote  . HERNIA REPAIR  06/03/07  . PERIPHERAL VASCULAR INTERVENTION Left 02/18/2017   Procedure: PERIPHERAL VASCULAR INTERVENTION;  Surgeon: Serafina Mitchell, MD;  Location: [facility];  Service: Cardiovascular;  Laterality: Left;  POPLITEAL  . PERIPHERAL VASCULAR INTERVENTION Bilateral 05/05/2018   Procedure: PERIPHERAL VASCULAR INTERVENTION;  Surgeon: Serafina Mitchell, MD;  Location: [facility];  Service: Cardiovascular;  Laterality: Bilateral;  Rt fem/pop graft and Lt popliteal  . VASECTOMY  01/28/08    Allergies  Allergen Reactions  . Fentanyl   . Fentanyl And Related Other (See Comments)    Hallucinations    Current Outpatient Medications  Medication Sig Dispense Refill  . acetaminophen (TYLENOL) 500 MG tablet Take 1,000 mg by mouth every 6 (six) hours as needed for moderate pain or headache.     Marland Kitchen aspirin 81 MG tablet Take 81 mg by mouth daily.    . calcium carbonate (OS-CAL) 600 MG TABS Take 600 mg by mouth 2 (two) times daily with a meal.    . cephALEXin (KEFLEX) 500 MG capsule Take 500 mg by mouth 3 (three) times daily.    . Cholecalciferol (VITAMIN D) 2000 units CAPS Take 2,000 Units by mouth daily.    . famotidine (PEPCID) 20 MG tablet TAKE 1 TABLET BY MOUTH EACH NIGHT AT BEDTIME AS DIRECTED 90 tablet 0  . finasteride (PROSCAR) 5 MG tablet Take 5 mg by mouth daily.    . folic acid (FOLVITE) 1 MG tablet Take 1 mg by mouth every evening.     Marland Kitchen gentamicin cream (GARAMYCIN) 0.1 % APPLY SPARINGLY TO AFFECTED  AREA(S) FOUR TIMES DAILY    . hydrochlorothiazide (MICROZIDE) 12.5 MG capsule Take 12.5 mg by mouth daily.    . hydrOXYzine (ATARAX/VISTARIL) 25 MG tablet Take 25 mg by mouth 3 (three) times daily.    . niacin (NIASPAN) 500 MG CR tablet Take 2,000 mg by mouth every evening.     Marland Kitchen  oxyCODONE-acetaminophen (PERCOCET) 10-325 MG tablet Take 1 tablet by mouth 3 (three) times daily as needed for severe pain.    . pantoprazole (PROTONIX) 40 MG tablet TAKE 1 TABLET BY MOUTH ONCE DAILY 30 TO 60 MINUTES BEFORE FIRST MEAL OF THE DAY 90 tablet 0  . Pirfenidone (ESBRIET) 267 MG TABS Take 3 tablets (801 mg total) by mouth 3 (three) times daily. 810 tablet 5  . simvastatin (ZOCOR) 20 MG tablet Take 1 tablet (20 mg total) by mouth daily at 6 PM. 90 tablet 2  . tamsulosin (FLOMAX) 0.4 MG CAPS capsule Take 0.4 mg by mouth daily.    . valsartan (DIOVAN) 80 MG tablet Take 0.5 tablets (40 mg total) by mouth daily. TAKE 1/2 TABLET (40MG DAILY). 90 tablet 3  . warfarin (COUMADIN) 5 MG tablet Take 1 tablet by mouth daily or as directed by PCP 45 tablet 0   No current facility-administered medications for this visit.    Social History   Socioeconomic History  . Marital status: Single    Spouse name: Not on file  . Number of children: Not on file  . Years of education: Not on file  . Highest education level: Not on file  Occupational History  . Not on file  Tobacco Use  . Smoking status: Former Smoker    Packs/day: 2.00    Years: 25.00    Pack years: 50.00    Types: Cigarettes    Quit date: 04/26/1986    Years since quitting: 33.5  . Smokeless tobacco: Never Used  Substance and Sexual Activity  . Alcohol use: No    Alcohol/week: 0.0 standard drinks  . Drug use: No  . Sexual activity: Not on file  Other Topics Concern  . Not on file  Social History Narrative  . Not on file   Social Determinants of Health   Financial Resource Strain:   . Difficulty of Paying Living Expenses:   Food Insecurity:   . Worried About Charity fundraiser in the Last Year:   . Arboriculturist in the Last Year:   Transportation Needs:   . Film/video editor (Medical):   Marland Kitchen Lack of Transportation (Non-Medical):   Physical Activity:   . Days of Exercise per Week:   . Minutes of Exercise per  Session:   Stress:   . Feeling of Stress :   Social Connections:   . Frequency of Communication with Friends and Family:   . Frequency of Social Gatherings with Friends and Family:   . Attends Religious Services:   . Active Member of Clubs or Organizations:   . Attends Archivist Meetings:   Marland Kitchen Marital Status:   Intimate Partner Violence:   . Fear of Current or Ex-Partner:   . Emotionally Abused:   Marland Kitchen Physically Abused:   . Sexually Abused:     Family History  Problem Relation Age of Onset  . Lung cancer Mother        smoked  . Heart failure Father   . Stroke Sister    Socially he is divorced has 3 children and 2 grandchildren. He is not  routine exercise. No tobacco or alcohol use.  ROS General: Negative; No fevers, chills, or night sweats;  HEENT: Now with a hearing aid in his right ear due to decreased hearing., sinus congestion, difficulty swallowing Pulmonary: Shortness of breath, positive for asbestosis and interstitial pulmonary fibrosis Cardiovascular:  See HPI: No change in claudication GI: Negative; No nausea, vomiting, diarrhea, or abdominal pain GU: Negative; No dysuria, hematuria, or difficulty voiding Musculoskeletal: Negative; no myalgias, joint pain, or weakness Hematologic/Oncology: Negative; no easy bruising, bleeding Endocrine: Negative; no heat/cold intolerance; no diabetes Neuro: Occasional transient toe paresthesias; no changes in balance, headaches Skin: Negative; No rashes or skin lesions Psychiatric: Negative; No behavioral problems, depression Sleep: Positive for sleep apnea, currently untreated for the past 3-4 years.  Originally diagnosed in 2005, both obstructive and central events.  Positive for daytime sleepiness, hypersomnolence; no bruxism, restless legs, hypnogognic hallucinations, no cataplexy.  Had recently received a new machine from the [place]. Other comprehensive 14 point system review is negative.  PE BP 98/62   Pulse (!) 107    Ht 5' 10.5" (1.791 m)   Wt 182 lb (82.6 kg)   SpO2 95%   BMI 25.75 kg/m    Repeat blood pressure by me was 98/64  Wt Readings from Last 3 Encounters:  10/25/19 182 lb (82.6 kg)  08/23/19 178 lb 9.6 oz (81 kg)  06/22/19 184 lb 12.8 oz (83.8 kg)    General: Alert, oriented, no distress.  Skin: normal turgor, no rashes, warm and dry HEENT: Normocephalic, atraumatic. Pupils equal round and reactive to light; sclera anicteric; extraocular muscles intact;  Nose without nasal septal hypertrophy Mouth/Parynx benign; Mallinpatti scale 3 Neck: No JVD, no carotid bruits; normal carotid upstroke Lungs: decreased breath sounds Chest wall: without tenderness to palpitation Heart: PMI not displaced, RRR, s1 s2 normal, 2/6 mid to late peaking systolic murmur, no diastolic murmur, no rubs, gallops, thrills, or heaves Abdomen: soft, nontender; no hepatosplenomehaly, BS+; abdominal aorta nontender and not dilated by palpation. Back: no CVA tenderness Pulses 2+ Musculoskeletal: full range of motion, normal strength, no joint deformities Extremities: no clubbing cyanosis or edema, Homan's sign negative  Neurologic: grossly nonfocal; Cranial nerves grossly wnl Psychologic: Normal mood and affect   ECG (independently read by me):Sinus tahycardia at 107;RBBB, no ectopy; 1st degree AV block with PR 225 msec; QYc 512 msec    February 15, 2021ECG (independently read by me): Sinus rhythm with PACs in an atrial bigeminal pattern  November 2020 ECG (independently read by me): Sinus rhythm with suggestion of intermittent second-degree Mobitz type I block.  Left axis deviation.  Nonspecific interventricular block.  QTc interval 435 ms.  September 2019 ECG (independently read by me): Sinus rhythm with sinus arrhythmia, isolated PVC.  Left anterior hemiblock.  Early transition with prominent R wave in V1 and V2  September 2018 ECG (independently read by me): sinus bradycardia 51 bpm.  Left axis deviation.   Inferior Q waves with early transition.  Normal intervals.  April 2017 ECG (independently read by me): Normal sinus rhythm with PACs.  Heart rate 62 bpm.  Inferior posterior MI  November 2016 ECG (independently read by me):  Normal sinus rhythm at 62 bpm.   Incomplete right bundle branch block.  Prior ECG (independently read by me): Sinus bradycardia at 58 bpm with an isolate a PVC and occasional PACs with mild sinus arrhythmia.  Evidence for prior inferior posterior infarct.  Nonspecific ST changes.  Prior 11/05/2013 ECG (independently read by me): Sinus bradycardia  at 49 beats per minute.  QTc interval 426 ms.  PR interval 174 ms.  Mild RV conduction delay  Prior 04/26/2013 ECG: Marked sinus bradycardia 43 beats per minute. Left axis deviation. 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  LABS:  BMP Latest Ref Rng & Units 05/05/2018 04/10/2018 11/28/2017  Glucose 70 - 99 mg/dL 105(H) 102(H) 115(H)  BUN 8 - 23 mg/dL 25(H) 17 23  Creatinine 0.61 - 1.24 mg/dL 1.50(H) 1.07 1.07  BUN/Creat Ratio 10 - 24 - 16 -  Sodium 135 - 145 mmol/L 140 138 137  Potassium 3.5 - 5.1 mmol/L 3.9 4.2 3.8  Chloride 98 - 111 mmol/L 102 99 104  CO2 20 - 29 mmol/L - 21 23  Calcium 8.6 - 10.2 mg/dL - 9.8 10.2   Hepatic Function Latest Ref Rng & Units 06/22/2019 12/10/2018 04/10/2018  Total Protein 6.0 - 8.3 g/dL 7.9 7.7 7.6  Albumin 3.5 - 5.2 g/dL 4.1 4.4 4.4  AST 0 - 37 U/L _0 ALT 0 - 53 U/L _1 Alk Phosphatase 39 - 117 U/L 65 63 74  Total Bilirubin 0.2 - 1.2 mg/dL 0.4 0.4 0.4  Bilirubin, Direct 0.0 - 0.3 mg/dL 0.0 0.1 -   CBC Latest Ref Rng & Units 05/05/2018 08/25/2017 03/25/2017  WBC 4.0 - 10.5 K/uL - 8.1 9.9  Hemoglobin 13.0 - 17.0 g/dL 15.0 15.3 15.9  Hematocrit 39.0 - 52.0 % 44.0 46.2 47.9  Platelets 150 - 400 K/uL - 144(L) 157.0   Lab Results  Component Value Date   MCV 95.7 08/25/2017   MCV 94.0 03/25/2017   MCV 91.1 01/21/2017   No results found for: HGBA1C    Lab Results  Component Value Date   TSH 2.870 04/10/2018   Lipid Panel     Component Value Date/Time   CHOL 109 04/10/2018 0827   CHOL 118 05/05/2013 0839   TRIG 106 04/10/2018 0827   TRIG 202 (H) 05/05/2013 0839   HDL 45 04/10/2018 0827   HDL 38 (L) 05/05/2013 0839   CHOLHDL 2.4 04/10/2018 0827   CHOLHDL 2.6 05/20/2014 0823   VLDL 27 05/20/2014 0823   LDLCALC 43 04/10/2018 0827   LDLCALC 40 05/05/2013 0839     RADIOLOGY: No results found.  Cardiac Studies: The patient wore an event monitor from December 10 through June 30, 2019.  The predominant rhythm was sinus rhythm with an average rate at 65 bpm.  The slowest heart rate was sinus bradycardia at 47 bpm which occurred on December 14 at 12:50 AM.  The fastest heart beat was sinus tachycardia on December 15 which occurred at 8:23 AM.  The patient had several episodes of atrial fibrillation with multifocal PVCs, as well as atrial flutter with variable block with PVCs which were auto triggered. The  minimum heart rate in A. fib was 56 bpm with maximum heart rate 97 bpm.  There were no instances of heart block, prolonged pauses.  There was an isolated multifocal atrial couplet but no episodes of VT.   ECHO: 10/18/2019 MPRESSIONS  1. Left ventricular ejection fraction, by estimation, is 20 to 25%. The  left ventricle has severely decreased function. The left ventricle  demonstrates global hypokinesis. The left ventricular internal cavity size  was severely dilated. There is mild  left ventricular hypertrophy. Left ventricular diastolic parameters are  indeterminate.  2. Right ventricular systolic function is severely reduced. The right  ventricular size is severely enlarged. There is mildly elevated  pulmonary  artery systolic pressure.  3. Right atrial size was mild to moderately dilated.  4. The mitral valve is abnormal. Mild mitral valve regurgitation.  5. AV is thickened, calcified with restricted motion Peak and  mean  gradients through the vale are 26 and 12 mm Hg respectively. LVOT / AV VTI  ratio is 0.22 consistent with severe AS. Marland Kitchen The aortic valve is abnormal.  Aortic valve regurgitation is not  visualized.  6. The inferior vena cava is normal in size with <50% respiratory  variability, suggesting right atrial pressure of 8 mmHg.  7. Compared to echo from October 2020, LVEF and RVEF are depressed.    IMPRESSION: 1. CAD in native artery   2. Hx of CABG   3. Severe low gradient aortic stenosis   4. Ischemic cardiomyopathy   5. Paroxysmal atrial fibrillation ([facility])   6. IPF (idiopathic pulmonary fibrosis) ([place])   7. PVD (peripheral vascular disease) ([place])     ASSESSMENT AND PLAN: Mr. Nehring is a 79 year old Caucasian male who has CAD dating back to 1994 when he underwent his initial CABG surgery and required redo bypass surgery in 2005. He has peripheral vascular disease.   His last nuclear study revealed inferior scar without associated ischemia. 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  He is status post remote right fem-tib bypass graft surgery in 1996. He is followed  by Troy Miller.  I reviewed his most recent peripheral vascular Doppler studies which showed patent stents in the right proximal SFA, right proximal SFA to peroneal  artery bypass graft as well as left proximal SFA to proximal TPT stent. A 2-week Holter monitor  demonstrated predominant sinus rhythm but he was found to have several episodes of atrial flutter and atrial fibrillation with PVCs.  Fastest A. fib rate was 97 bpm with longest duration at 21 minutes.  He is on warfarin anticoagulation.  When I saw him February 2020 was sent and medications were adjusted.  Blood pressure today is improved and he is on valsartan at 40 mg daily.  There is confusion as to  whether or not he is still on HCTZ but this has been discontinued as part of his valsartan/HCT regimen previously.  He is on supplemental oxygen due to idiopathic pulmonary fibrosis and remains on Esbriet followed by Dr. Chase Caller.  He requires 3 L of oxygen with activity and at night.  His most recent echo Doppler study has shown progressively decreased LV function.  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  I discussed the possibility of undergoing right left heart cardiac catheterization.  His LV is dilated.  He has significant comorbidities and would not be a good candidate for third CABG and surgical aortic valve replacement if he had significant additional coronary artery or graft disease.  He will contemplate the above discussion concerning right and left heart cardiac catheterization with questionable TAVR candidacy.  I will discuss with TAVR colleagues.   Troy Sine, MD, [facility]  10/31/2019 11:53 AM

## 2019-10-31 ENCOUNTER — Encounter: Payer: Self-pay | Admitting: Cardiovascular Disease

## 2019-11-01 ENCOUNTER — Telehealth: Payer: Self-pay | Admitting: Cardiovascular Disease

## 2019-11-01 NOTE — Telephone Encounter (Signed)
New Message:     Pt said he saw Dr Claiborne Billings last Monday and discussed having a Cath. He says he wants to talk to Dr Claiborne Billings nurse. He says he wants to discuss  This with her, before they schedule his Cath.

## 2019-11-01 NOTE — Telephone Encounter (Signed)
Called and spoke with pt to discuss scheduling his heart cath that was discussed with [facility] on his appt last Monday.  He wants to go ahead and get the procedure scheduled. He reports he already has 3 appts this week with dermatology, pain clinic, and his urology clinic. He states he has these appts Tuesday, Wednesday, and Thursday.  He reports that any day of the week works well for him to schedule the cath.  He also wants to know if the valve replacement discussed with Dr.Kelly would be done at the same time. Notified that I was unaware, but I would route this message to [facility] to review and find a date that he would like the procedure to be done. Pt verbalized understanding with no other questions at this time.

## 2019-11-07 NOTE — Telephone Encounter (Signed)
Patient will need a right and left heart cardiac catheterization with graft study.  Will discuss in office timing of this procedure be going on vacation on May 7.

## 2019-11-08 NOTE — Telephone Encounter (Signed)
Patient following up on heart cath scheduling.

## 2019-11-09 ENCOUNTER — Telehealth: Payer: Self-pay | Admitting: Cardiovascular Disease

## 2019-11-09 NOTE — Telephone Encounter (Signed)
New message   Patient has questions about setting up for a cath procedure. Please call to discuss.

## 2019-11-09 NOTE — Telephone Encounter (Signed)
Returned call to patient, advised Dr. Claiborne Billings and nurse were OOO yesterday.    Advised nurse is aware and will discuss with Dr. Claiborne Billings today.  Dr. Claiborne Billings is OOO next 2 weeks.  Back in cath lab 5/24.    Patient also wondering if the valve can be replaced the same time as the cath.  Advised this would not be done at the same time.      Patient aware and verbalized understanding.

## 2019-11-11 NOTE — Telephone Encounter (Signed)
Spoke with Dr.Kelly, he will be going out of town on 5/7 and does not want to do the pt's cath then leave. Dr.Kelly is back in cath lab on 5/24 and would like to work pt into an office day first so he is compliant with the 30day H&P prior to doing procedure.  Called and notified pt. Made aware that Dr.Kelly is putting in a few days the week of 5/17 and that we can schedule him for an appt as soon as these are finalized. Notified that we will go over cath instructions at this ov. Pt verbalized understanding with no other questions at this time.  

## 2019-11-11 NOTE — Telephone Encounter (Signed)
Spoke with Dr.Kelly, he will be going out of town on 5/7 and does not want to do the pt's cath then leave. Dr.Kelly is back in cath lab on 5/24 and would like to work pt into an office day first so he is compliant with the 30day H&P prior to doing procedure.  Called and notified pt. Made aware that Dr.Kelly is putting in a few days the week of 5/17 and that we can schedule him for an appt as soon as these are finalized. Notified that we will go over cath instructions at this ov. Pt verbalized understanding with no other questions at this time.

## 2019-11-12 NOTE — Telephone Encounter (Signed)
Follow up  Pt returning call, transferred call to [facility]

## 2019-11-12 NOTE — Telephone Encounter (Signed)
Called and spoke with pt. Able to schedule for OV on 11/24/19 at 10:20am Pt verbalized understanding with no questions at this time

## 2019-11-12 NOTE — Telephone Encounter (Signed)
Called to schedule pt for appt with Dr.Kelly on 5/19-5/21. Pt unable to answer the phone but someone else did and stated he was in prostate surgery at the moment and she would have him call us back.

## 2019-11-17 ENCOUNTER — Telehealth: Payer: Self-pay | Admitting: Internal Medicine

## 2019-11-17 NOTE — Telephone Encounter (Signed)
Called attorney's office but they were closed for the day. Will need to call back tomorrow.

## 2019-11-19 ENCOUNTER — Telehealth: Payer: Self-pay | Admitting: Cardiovascular Disease

## 2019-11-19 NOTE — Telephone Encounter (Signed)
New Message  Patient is calling because he would need someone to come with him to his appt. He is having a hard time walking so he would need to use the wheelchair and to be pushed around.

## 2019-11-19 NOTE — Telephone Encounter (Signed)
Spoke with pt and advised OK for son to accompany pt to 11/24/2019 appointment with Dr Claiborne Billings.  Pt verbalizes understanding and agrees with current plan.

## 2019-11-19 NOTE — Telephone Encounter (Signed)
[facility]

## 2019-11-22 NOTE — Telephone Encounter (Signed)
LMTCB x3 for [employer]. We have attempted to contact [place] several times with no success or call back from her. Per triage protocol, message will be closed.

## 2019-11-24 ENCOUNTER — Encounter: Payer: Self-pay | Admitting: Cardiovascular Disease

## 2019-11-24 ENCOUNTER — Ambulatory Visit (INDEPENDENT_AMBULATORY_CARE_PROVIDER_SITE_OTHER): Payer: Medicare Other | Admitting: Cardiovascular Disease

## 2019-11-24 ENCOUNTER — Other Ambulatory Visit: Payer: Self-pay

## 2019-11-24 ENCOUNTER — Ambulatory Visit: Payer: Medicare Other | Admitting: Cardiovascular Disease

## 2019-11-24 VITALS — BP 94/60 | HR 110 | Ht 70.5 in | Wt 174.0 lb

## 2019-11-24 DIAGNOSIS — Z951 Presence of aortocoronary bypass graft: Secondary | ICD-10-CM

## 2019-11-24 DIAGNOSIS — I350 Nonrheumatic aortic (valve) stenosis: Secondary | ICD-10-CM | POA: Diagnosis not present

## 2019-11-24 DIAGNOSIS — I5020 Unspecified systolic (congestive) heart failure: Secondary | ICD-10-CM

## 2019-11-24 DIAGNOSIS — I714 Abdominal aortic aneurysm, without rupture, unspecified: Secondary | ICD-10-CM

## 2019-11-24 DIAGNOSIS — I480 Paroxysmal atrial fibrillation: Secondary | ICD-10-CM

## 2019-11-24 DIAGNOSIS — I2510 Atherosclerotic heart disease of native coronary artery without angina pectoris: Secondary | ICD-10-CM

## 2019-11-24 DIAGNOSIS — I255 Ischemic cardiomyopathy: Secondary | ICD-10-CM

## 2019-11-24 DIAGNOSIS — J84112 Idiopathic pulmonary fibrosis: Secondary | ICD-10-CM

## 2019-11-24 DIAGNOSIS — I739 Peripheral vascular disease, unspecified: Secondary | ICD-10-CM

## 2019-11-24 DIAGNOSIS — Z7901 Long term (current) use of anticoagulants: Secondary | ICD-10-CM

## 2019-11-24 NOTE — Progress Notes (Signed)
Patient ID: Troy Miller, male   DOB: [birth year], 79 y.o.   MRN: [id number]     HPI: Troy Miller is a 79 y.o. male who presents for a one month cardiology and sleep evaluation.   Mr. Grattan has a history of CAD and in 1994 underwent initial CABG revascularization surgery. In May 2005 he underwent redo surgery with a RIMA to the LAD, vein graft to distal RCA by [name]. His previous LIMA graft was preserved and supplied the OM1 vessel. He has history of atrial flutter status post ablation by [name] as well as a history of PAF. He has documented peripheral vascular disease with abdominal aortic aneurysm, as well as common iliac artery aneurysms. He status post right fem-tib bypass graft surgery. Additional problems include hypertension as well as mixed hyperlipidemia. He also has mild aortic stenosis and documentation of an infrarenal abdominal aortic aneurysm.   A followup echo Doppler study on 05/11/2013 showed an ejection fraction in the 45-50% range with mild inferior hypokinesis.  There was grade 1 diastolic dysfunction.  His aortic valve was moderately calcified and there was evidence for mild aortic valve stenosis with a peak gradient of 19 and mean gradient of 10. Aortic valve area was 1.5 1.64 cm.  There was mild aortic root dilatation at 42 mm mild dilatation of the ascending aorta..  There was mitral regurgitation.  A follow-up abdominal aortic Doppler study on April 29, 2014 demonstrated an infrarenal fusiform aneurysm at 3.63.7 with mild amount of atherosclerosis visualized throughout.  The right and left proximal common iliac arteries also demonstrated aneurysmal dilatation.  The right common iliac artery measured 2.3.  A 2.3 without evidence for stenosis.  Left common iliac artery measured 3.2 x 3.0 with mild amount of thrombus and low flow velocity.  He underwent a follow-up ultrasound last week which showed slight progression.  His aneurysm in the distal aorta, now  measured 4.03.7.  There was a new finding of a right common iliac artery aneurysm measuring 2.2 by 2.3 cm.  The left common iliac artery was stable at 3.2 x 3 point 0 cm.  The IVC veins were patent.  A follow-up evaluation was recommended in one year.  A follow-up echo Doppler study on 11/04/2014 demonstrated an EF 50-55% without regional wall motion abnormalities.  There was evidence for mild aortic valve stenosis valve area of 1. 4 9 and 1.43 by measurements. Mean gradient was 14 and peak gradient 32 mmHg.  He saw [name] with complaints of chest discomfort in 2016.  A nuclear perfusion study was  on 02/10/2015:  Ejection fraction was 47%. There was evidence for prior inferior perfusion defect suggesting prior infarction.  There was no ischemia.  He underwent several treatment of skin lesions on his face by his dermatologist and will need additional treatment for 1.  On his nose which he states was positive for mild malignancy. He denies anginal symptoms.  He denies PND, orthopnea.  He denies bleeding.   Mr. Humphres has a history of obstructive sleep apnea dating back to 2005.  His initial sleep study showed severe sleep apnea with an AHI of 33.9, and in addition to obstructive events had central events.  Initial O2 desaturation was 81%.  Remotely he had seen Dr. [name], he had undergone a trial of BiPAP, but reportedly had felt better with CPAP therapy.  He has not used CPAP therapy for at least 3-4 years.  His DME company had been [employer] which is  no longer in business.  He admits to daytime sleepiness.  He does snore.  He denies any recent CHF symptomatology.   I referred him for a new sleep study which was done on 12/22/2015 and confirmed moderate sleep apnea with an AHI overall of 26 per hour.  However, sleep apnea was severe during REM sleep with an AHI 51.4.  He had significant oxygen desaturation to 82% in time below 88% was 76.4 minutes.  There was moderate snoring.  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  He received  a new CPAP machine.  He admits to 100% compliance.  His sleep has improved.  He denies daytime sleepiness and is unaware of breakthrough snoring.  I  saw him in September 2018 was also being seen by the [place].Marland Kitchen  Remotely he developed cough secondary to ace inhibition.  He is seeing Dr. 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  Occasionally he require supplemental oxygen if he is walking but typically does not use oxygen routinely.    An echo Doppler study from December 26, 2017 showed an EF of 35 to 40% with to mid inferolateral inferior inferoseptal mild hypokinesis.  There was grade 1 diastolic dysfunction.  There was mild aortic stenosis with a mean gradient of 21 and peak gradient of 31.  The ascending aorta was felt to be mildly dilated.  His RV was mildly dilated.  He had mild increased PA pressure at 34 mmHg. He underwent abdominal aortic Doppler to assess his abdominal aortic aneurysm on January 16, 2018  which showed the largest diameter 3.7 cm, essentially unchanged from prior exams.  Lower extremity duplex showed normal staying ABIs at his right toe brachial index was abnormal as well as his left toe brachial index.  He is being evaluated by Dr. Onnie Graham for intersitital pulmonary fibrosis and was started on Esbriet.  His LFTs have been normal.  He has a history of significant mixed hyperlipidemia and for this reason had been on combination niacin with statin.  ALT on March 31, 2018 was normal at 7 and AST was 13.  He previously had been on omega-3 fatty acids but apparently these were discontinued by Dr. Melvyn Novas according to the patient because of his asbestosis.  He had not had any recent lipid studies.   He underwent lower extremity PV angiography and intervention by Dr. Trula Slade on May 05, 2018 which demonstrated aneurysmal degeneration of the infrarenal  abdominal aorta and bilateral iliac arteries. The right common femoral profundofemoral artery were heavily calcified but patent.  The SFA was occluded.  A bypass graft originating from the SFA had a proximal stenosis and luminal irregularity at the anastomosis. Runoff was to the peroneal artery.  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  He underwent stent to the right SFA and a stent to the left popliteal artery.  He was evaluated in a telemedicine visit on October 26, 2018.  He believes his blood pressure has been controlled.  He continues to be on valsartan/HCTZ 80/12.5. Recent lipid studies were excellent on his current regimen with simvastatin and niacin with total cholesterol 109 triglycerides 106 HDL 45 LDL 43.  He had been on Xarelto 20 mg and has a remote history of atrial fibrillation ablation.  He has been maintaining sinus rhythm.  Unfortunately  the cost of Xarelto was too much and he was having to pay $266 per month out of pocket.  As result he was switched to warfarin Last week he had called the office and was to warfarin.    Since his April telemedicine evaluation, he believes he has done fairly well. He is on supplemental oxygen 2 L/min at home.  However he has a portable oxygen unit which he takes when he walks and typically it is set at 5 L/min.  He is followed by Dr. Chase Caller.  He underwent high-resolution CT of his chest in follow-up of his interstitial lung disease and stage I right lower lobe lung cancer treated with radiation therapy concluded in March 2019.  The September 17, 2018 CT demonstrated evolving radiation fibrosis in the posterior right lower lobe without evidence for local tumor recurrence.  He had basilar predominant fibrotic interstitial lung disease with mild honeycombing slightly progressed since 2018.  There also was stable calcified bilateral pleural plaques and smooth pleural thickening without  effusion.  He underwent follow-up vascular imaging studies in July and a Doppler of his abdominal aorta showed dilation of the distal aorta now measuring 3.9 cm compared to 3.6 cm in July 2019.  There was mild dilation of the right common iliac and left common iliac arteries.  Lower extremity Doppler study showed widely patent right proximal SFA stent without restenosis and a widely patent right proximal SFA to proximal peroneal artery bypass graft without restenosis.  The distal left CFA measured 1.8 cm.  There was a widely patent left proximal SFA to proximal TPT stent without stenosis.  He denies any anginal symptoms.  He does get short of breath with walking.  An echo Doppler study and profile showed mild improvement in LV function with EF now at 40 to 45%.  Aortic stenosis was moderate with a peak gradient of 33, mean gradient of 23, and aortic valve area of 1.3 cm.  He admits to occasional palpitations but denies presyncope.    I last saw him on August 23, 2019.  Since his prior evaluation in November 2020 he had worn  a cardiac monitor for 2 weeks.  This demonstrated predominant sinus rhythm with an average heart rate at 65 bpm.  The slowest heart rate was sinus bradycardia at 47 which occurred at 12:50 AM on June 21, 2019 and sinus tachycardia at 111 bpm at which occurred at 8:23 AM on December 15.  He was found to have 4 episodes of arrhythmia with 2 episodes of atrial fibrillation with multifocal PVCs, and 2 episodes of atrial flutter with variable block.  He is now on supplemental oxygen.  He has a long history of prior asbestosis exposure.  He had not been using his CPAP.  He received a new machine from the [place].  He is on warfarin anticoagulation.  He has been on valsartan HCT 80/12.5 for hypertension.  He is on finasteride and tamsulosin for prostate issues.  He is on Esbriet for his interstitial lung disease.  During his evaluation with me, his blood pressure was low at 82/55 supine and 80/56  standing and I recommended he discontinue valsartan HCT 80/12.5 mg daily and in its place initiated valsartan 40 mg alone.  He was evaluated by Dr. Chase Caller in April 2021 for his idiopathic pulmonary fibrosis.  He underwent a follow-up echo Doppler study on October 18, 2019.  EF was further depressed now at 20 to 25% and there was global hypokinesis.  Left ventricular internal  dimensions were internal dimensions were  dilated at 6.3 cm at end diastole and 5.0 cm and in systole.  Aortic root diameter was 4.1 cm.  It is in the low EF, peak and mean gradients were 26 and 12 mm respectively.  His LVOT/AV VTI ratio was 0.22 consistent with severe AS.   I saw him for follow-up evaluation of his echo Doppler study on 10/25/2019.  During that time he denied any chest pain.  He continues to use supplemental oxygen.  He had noticed progressive shortness of breath.  I reviewed his echo Doppler studies with him in detail and he had severe reduction in aortic valve excursion and I raise concern that he may very well have low gradient severe aortic valve stenosis which may be contributing to his reduced LV function.  I also discussed the possibility of progressive coronary obstructive disease leading also to reduced LV function and undoubtably that his idiopathic pulmonary fibrosis was also contributing to his progressive dyspnea.  During that evaluation I discussed possible right and left heart cardiac catheterization for definitive evaluation.  I recommended that he contemplate undergoing this procedure and had discussed with him due to significant comorbidities he would not be a good candidate for third CABG surgery or surgical aortic valve replacement but TAVR may be a potential option if was severe.  Over the last month, he notes he has become a little more shortness of breath.  He continues to be on 3 L of oxygen typically at night and when he is active.  He has not been using CPAP with consistency.  He has previously  documented sleep apnea.  At times he also states he has not been 100% using his supplemental oxygen at night.  He presents for follow-up evaluation.  Past Medical History:  Diagnosis Date  . AAA (abdominal aortic aneurysm) ([place])   . Aortic stenosis, mild    07/10/12 EF 50-55% on echo-mitral annular ca+ also  . Atrial fibrillation ([place])    03/19/10 ablation-[name]  . Atrial flutter ([place])   . CAD (coronary artery disease)   . DVT (deep venous thrombosis) ([place])   . History of stress test    Myoview 8/16:  EF 47%, inferior scar, no ischemia, Intermediate risk (low EF)  . Hyperlipemia   . Hypertension   . Iliac artery aneurysm ([place])   . PVD (peripheral vascular disease) ([place])    remote right fem-tib bypass graft sugery  . 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 ABDOMINAL AORTOGRAM W/LOWER EXTREMITY N/A 02/18/2017   Procedure: ABDOMINAL AORTOGRAM W/LOWER EXTREMITY;  Surgeon: Serafina Mitchell, MD;  Location: [facility];  Service: Cardiovascular;  Laterality: N/A;  . ABDOMINAL AORTOGRAM W/LOWER EXTREMITY N/A 05/05/2018   Procedure: ABDOMINAL AORTOGRAM W/LOWER EXTREMITY;  Surgeon: Serafina Mitchell, MD;  Location: [facility];  Service: Cardiovascular;  Laterality: N/A;  . BACK SURGERY  2003  . CARDIAC ELECTROPHYSIOLOGY STUDY AND ABLATION  03/19/10  . CARDIOVERSION  08/16/08  . CHOLECYSTECTOMY  08/13/02  . CORONARY ARTERY BYPASS GRAFT  1994 & 2005  . FEMORAL-TIBIAL BYPASS GRAFT     Remote  . HERNIA REPAIR  06/03/07  . PERIPHERAL VASCULAR INTERVENTION Left 02/18/2017   Procedure: PERIPHERAL VASCULAR INTERVENTION;  Surgeon: Serafina Mitchell, MD;  Location: [facility];  Service: Cardiovascular;  Laterality: Left;  POPLITEAL  . PERIPHERAL VASCULAR INTERVENTION Bilateral 05/05/2018   Procedure:  PERIPHERAL VASCULAR INTERVENTION;  Surgeon: Serafina Mitchell, MD;  Location: [facility];   Service: Cardiovascular;  Laterality: Bilateral;  Rt fem/pop graft and Lt popliteal  . VASECTOMY  01/28/08    Allergies  Allergen Reactions  . Fentanyl   . Fentanyl And Related Other (See Comments)    Hallucinations    Current Outpatient Medications  Medication Sig Dispense Refill  . acetaminophen (TYLENOL) 500 MG tablet Take 1,000 mg by mouth every 6 (six) hours as needed for moderate pain or headache.     Marland Kitchen aspirin 81 MG tablet Take 81 mg by mouth daily.    . calcium carbonate (OS-CAL) 600 MG TABS Take 600 mg by mouth 2 (two) times daily with a meal.    . cephALEXin (KEFLEX) 500 MG capsule Take 500 mg by mouth 3 (three) times daily.    . Cholecalciferol (VITAMIN D) 2000 units CAPS Take 2,000 Units by mouth daily.    . famotidine (PEPCID) 20 MG tablet TAKE 1 TABLET BY MOUTH EACH NIGHT AT BEDTIME AS DIRECTED 90 tablet 0  . finasteride (PROSCAR) 5 MG tablet Take 5 mg by mouth daily.    . folic acid (FOLVITE) 1 MG tablet Take 1 mg by mouth every evening.     Marland Kitchen gentamicin cream (GARAMYCIN) 0.1 % APPLY SPARINGLY TO AFFECTED AREA(S) FOUR TIMES DAILY    . hydrochlorothiazide (MICROZIDE) 12.5 MG capsule Take 12.5 mg by mouth daily.    . hydrOXYzine (ATARAX/VISTARIL) 25 MG tablet Take 25 mg by mouth 3 (three) times daily.    . niacin (NIASPAN) 500 MG CR tablet Take 2,000 mg by mouth every evening.     Marland Kitchen oxyCODONE-acetaminophen (PERCOCET) 10-325 MG tablet Take 1 tablet by mouth 3 (three) times daily as needed for severe pain.    . pantoprazole (PROTONIX) 40 MG tablet TAKE 1 TABLET BY MOUTH ONCE DAILY 30 TO 60 MINUTES BEFORE FIRST MEAL OF THE DAY 90 tablet 0  . Pirfenidone (ESBRIET) 267 MG TABS Take 3 tablets (801 mg total) by mouth 3 (three) times daily. 810 tablet 5  . simvastatin (ZOCOR) 20 MG tablet Take 1 tablet (20 mg total) by mouth daily at 6 PM. 90 tablet 2  . tamsulosin (FLOMAX) 0.4 MG CAPS capsule Take 0.4 mg by mouth daily.    . valsartan (DIOVAN) 80 MG tablet Take 0.5 tablets (40 mg  total) by mouth daily. TAKE 1/2 TABLET (40MG DAILY). 90 tablet 3  . warfarin (COUMADIN) 5 MG tablet Take 1 tablet by mouth daily or as directed by PCP 45 tablet 0   No current facility-administered medications for this visit.    Social History   Socioeconomic History  . Marital status: Single    Spouse name: Not on file  . Number of children: Not on file  . Years of education: Not on file  . Highest education level: Not on file  Occupational History  . Not on file  Tobacco Use  . Smoking status: Former Smoker    Packs/day: 2.00    Years: 25.00    Pack years: 50.00    Types: Cigarettes    Quit date: 04/26/1986    Years since quitting: 33.6  . Smokeless tobacco: Never Used  Substance and Sexual Activity  . Alcohol use: No    Alcohol/week: 0.0 standard drinks  . Drug use: No  . Sexual activity: Not on file  Other Topics Concern  . Not on file  Social History Narrative  . Not on  file   Social Determinants of Health   Financial Resource Strain:   . Difficulty of Paying Living Expenses:   Food Insecurity:   . Worried About Charity fundraiser in the Last Year:   . Arboriculturist in the Last Year:   Transportation Needs:   . Film/video editor (Medical):   Marland Kitchen Lack of Transportation (Non-Medical):   Physical Activity:   . Days of Exercise per Week:   . Minutes of Exercise per Session:   Stress:   . Feeling of Stress :   Social Connections:   . Frequency of Communication with Friends and Family:   . Frequency of Social Gatherings with Friends and Family:   . Attends Religious Services:   . Active Member of Clubs or Organizations:   . Attends Archivist Meetings:   Marland Kitchen Marital Status:   Intimate Partner Violence:   . Fear of Current or Ex-Partner:   . Emotionally Abused:   Marland Kitchen Physically Abused:   . Sexually Abused:     Family History  Problem Relation Age of Onset  . Lung cancer Mother        smoked  . Heart failure Father   . Stroke Sister     Socially he is divorced has 3 children and 2 grandchildren. He is not routine exercise. No tobacco or alcohol use.  ROS General: Negative; No fevers, chills, or night sweats;  HEENT: Now with a hearing aid in his right ear due to decreased hearing., sinus congestion, difficulty swallowing Pulmonary: Shortness of breath, positive for asbestosis and interstitial pulmonary fibrosis Cardiovascular:  See HPI: No change in claudication GI: Negative; No nausea, vomiting, diarrhea, or abdominal pain GU: Negative; No dysuria, hematuria, or difficulty voiding Musculoskeletal: Negative; no myalgias, joint pain, or weakness Hematologic/Oncology: Negative; no easy bruising, bleeding Endocrine: Negative; no heat/cold intolerance; no diabetes Neuro: Occasional transient toe paresthesias; no changes in balance, headaches Skin: Negative; No rashes or skin lesions Psychiatric: Negative; No behavioral problems, depression Sleep: Positive for sleep apnea, currently untreated for the past 3-4 years.  Originally diagnosed in 2005, both obstructive and central events.  Positive for daytime sleepiness, hypersomnolence; no bruxism, restless legs, hypnogognic hallucinations, no cataplexy.  Had recently received a new machine from the [place]. Other comprehensive 14 point system review is negative.  PE BP 94/60 (BP Location: Right Arm, Patient Position: Sitting, Cuff Size: Normal)   Pulse (!) 110   Ht 5' 10.5" (1.791 m)   Wt 174 lb (78.9 kg)   BMI 24.61 kg/m    Repeat blood pressure by me was 110/66  Wt Readings from Last 3 Encounters:  11/24/19 174 lb (78.9 kg)  10/25/19 182 lb (82.6 kg)  08/23/19 178 lb 9.6 oz (81 kg)   General: Alert, oriented, no distress.  Skin: normal turgor, no rashes, warm and dry HEENT: Normocephalic, atraumatic. Pupils equal round and reactive to light; sclera anicteric; extraocular muscles intact;  Nose without nasal septal hypertrophy Mouth/Parynx benign; Mallinpatti scale  3 Neck: No JVD, no carotid bruits; normal carotid upstroke Lungs: clear to ausculatation and percussion; no wheezing or rales Chest wall: without tenderness to palpitation Heart: PMI not displaced, RRR, s1 s2 normal, 2/6 mid-to-late peaking systolic murmur, no diastolic murmur, no rubs, gallops, thrills, or heaves Abdomen: soft, nontender; no hepatosplenomehaly, BS+; abdominal aorta nontender and not dilated by palpation. Back: no CVA tenderness Pulses 2+ Musculoskeletal: full range of motion, normal strength, no joint deformities Extremities: no clubbing cyanosis or edema,  Homan's sign negative  Neurologic: grossly nonfocal; Cranial nerves grossly wnl Psychologic: Normal mood and affect   ECG (independently read by me): Sinus tachycardia ay 110; PAC, RBBB; LAHB; PR 148 msec  October 25, 2019 ECG (independently read by me):Sinus tahycardia at 107;RBBB, no ectopy; 1st degree AV block with PR 225 msec; QTc 512 msec    February 15, 2021ECG (independently read by me): Sinus rhythm with PACs in an atrial bigeminal pattern  November 2020 ECG (independently read by me): Sinus rhythm with suggestion of intermittent second-degree Mobitz type I block.  Left axis deviation.  Nonspecific interventricular block.  QTc interval 435 ms.  September 2019 ECG (independently read by me): Sinus rhythm with sinus arrhythmia, isolated PVC.  Left anterior hemiblock.  Early transition with prominent R wave in V1 and V2  September 2018 ECG (independently read by me): sinus bradycardia 51 bpm.  Left axis deviation.  Inferior Q waves with early transition.  Normal intervals.  April 2017 ECG (independently read by me): Normal sinus rhythm with PACs.  Heart rate 62 bpm.  Inferior posterior MI  November 2016 ECG (independently read by me):  Normal sinus rhythm at 62 bpm.   Incomplete right bundle branch block.  Prior ECG (independently read by me): Sinus bradycardia at 58 bpm with an isolate a PVC and occasional PACs  with mild sinus arrhythmia.  Evidence for prior inferior posterior infarct.  Nonspecific ST changes.  Prior 11/05/2013 ECG (independently read by me): Sinus bradycardia at 49 beats per minute.  QTc interval 426 ms.  PR interval 174 ms.  Mild RV conduction delay  Prior 04/26/2013 ECG: Marked sinus bradycardia 43 beats per minute. Left axis deviation. 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  LABS:  BMP Latest Ref Rng & Units 05/05/2018 04/10/2018 11/28/2017  Glucose 70 - 99 mg/dL 105(H) 102(H) 115(H)  BUN 8 - 23 mg/dL 25(H) 17 23  Creatinine 0.61 - 1.24 mg/dL 1.50(H) 1.07 1.07  BUN/Creat Ratio 10 - 24 - 16 -  Sodium 135 - 145 mmol/L 140 138 137  Potassium 3.5 - 5.1 mmol/L 3.9 4.2 3.8  Chloride 98 - 111 mmol/L 102 99 104  CO2 20 - 29 mmol/L - 21 23  Calcium 8.6 - 10.2 mg/dL - 9.8 10.2   Hepatic Function Latest Ref Rng & Units 06/22/2019 12/10/2018 04/10/2018  Total Protein 6.0 - 8.3 g/dL 7.9 7.7 7.6  Albumin 3.5 - 5.2 g/dL 4.1 4.4 4.4  AST 0 - 37 U/L _0 ALT 0 - 53 U/L _1 Alk Phosphatase 39 - 117 U/L 65 63 74  Total Bilirubin 0.2 - 1.2 mg/dL 0.4 0.4 0.4  Bilirubin, Direct 0.0 - 0.3 mg/dL 0.0 0.1 -   CBC Latest Ref Rng & Units 05/05/2018 08/25/2017 03/25/2017  WBC 4.0 - 10.5 K/uL - 8.1 9.9  Hemoglobin 13.0 - 17.0 g/dL 15.0 15.3 15.9  Hematocrit 39.0 - 52.0 % 44.0 46.2 47.9  Platelets 150 - 400 K/uL - 144(L) 157.0   Lab Results  Component Value Date   MCV 95.7 08/25/2017   MCV 94.0 03/25/2017   MCV 91.1 01/21/2017   No results found for: HGBA1C  Lab Results  Component Value Date   TSH 2.870 04/10/2018   Lipid Panel     Component Value Date/Time   CHOL 109 04/10/2018 0827   CHOL 118 05/05/2013 0839   TRIG 106 04/10/2018 0827   TRIG 202 (H) 05/05/2013 0839   HDL 45 04/10/2018 0827  HDL 38 (L) 05/05/2013 0839   CHOLHDL 2.4 04/10/2018 0827   CHOLHDL 2.6 05/20/2014 0823   VLDL 27 05/20/2014 0823   LDLCALC 43 04/10/2018 0827   LDLCALC  40 05/05/2013 0839     RADIOLOGY: No results found.  Cardiac Studies: The patient wore an event monitor from December 10 through June 30, 2019.  The predominant rhythm was sinus rhythm with an average rate at 65 bpm.  The slowest heart rate was sinus bradycardia at 47 bpm which occurred on December 14 at 12:50 AM.  The fastest heart beat was sinus tachycardia on December 15 which occurred at 8:23 AM.  The patient had several episodes of atrial fibrillation with multifocal PVCs, as well as atrial flutter with variable block with PVCs which were auto triggered. The  minimum heart rate in A. fib was 56 bpm with maximum heart rate 97 bpm.  There were no instances of heart block, prolonged pauses.  There was an isolated multifocal atrial couplet but no episodes of VT.   ECHO: 10/18/2019 MPRESSIONS  1. Left ventricular ejection fraction, by estimation, is 20 to 25%. The  left ventricle has severely decreased function. The left ventricle  demonstrates global hypokinesis. The left ventricular internal cavity size  was severely dilated. There is mild  left ventricular hypertrophy. Left ventricular diastolic parameters are  indeterminate.  2. Right ventricular systolic function is severely reduced. The right  ventricular size is severely enlarged. There is mildly elevated pulmonary  artery systolic pressure.  3. Right atrial size was mild to moderately dilated.  4. The mitral valve is abnormal. Mild mitral valve regurgitation.  5. AV is thickened, calcified with restricted motion Peak and mean  gradients through the vale are 26 and 12 mm Hg respectively. LVOT / AV VTI  ratio is 0.22 consistent with severe AS. Marland Kitchen The aortic valve is abnormal.  Aortic valve regurgitation is not  visualized.  6. The inferior vena cava is normal in size with <50% respiratory  variability, suggesting right atrial pressure of 8 mmHg.  7. Compared to echo from October 2020, LVEF and RVEF are depressed.     IMPRESSION: 1. CAD in native artery   2. Severe aortic stenosis   3. Hx of CABG   4. Paroxysmal atrial fibrillation (HCC)   5. PVD (peripheral vascular disease) (HCC)   6. HFrEF (heart failure with reduced ejection fraction) ([place])   7. IPF (idiopathic pulmonary fibrosis) ([place])   8. Chronic anticoagulation   9. AAA (abdominal aortic aneurysm) without rupture [facility])     ASSESSMENT AND PLAN: Troy Miller is a 79 year old Caucasian male who has CAD dating back to 1994 when he underwent his initial CABG surgery and required redo bypass surgery in 2005. He has peripheral vascular disease.   His last nuclear study revealed inferior scar without associated ischemia. 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  He is status post remote right fem-tib bypass graft surgery in 1996. He is followed  by Dr. Trula Slade.  His most recent peripheral vascular Doppler studies which showed patent stents in the right proximal SFA, right proximal SFA to peroneal  artery bypass graft as well as left proximal SFA to proximal TPT stent. A 2-week Holter monitor  demonstrated predominant sinus rhythm but he was found to have several episodes of atrial flutter and atrial fibrillation with PVCs.  Fastest A. fib  rate was 97 bpm with longest duration at 21 minutes.  He is on warfarin anticoagulation.  When I saw him February 2020 was sent and medications were adjusted.  I again reviewed his most recent echo Doppler study which showed an EF reduction of 20 to 25% which raise concern for low gradient severe aortic stenosis.  Over the past month he has continued to note shortness of breath with minimal activity to the point where he does not do much at all.  He continues to be on Esbriet for his idiopathic pulmonary fibrosis and is on supplemental oxygen with activity and night.  He  has made the decision that he would like to pursue definitive evaluation.  I again discussed with him at length today with his son present the potential for his aortic stenosis contributing to his marked LV dysfunction as well as progressive CAD contributing as well to potential ischemic cardiomyopathy.  His shortness of breath is also undoubtedly contributed by his pulmonary fibrosis.  I had a lengthy discussion with him concerning the risk benefits of right and left heart cardiac catheterization.I have reviewed the risks, indications, and alternatives to cardiac catheterization, possible angioplasty, and stenting with the patient. Risks include but are not limited to bleeding, infection, vascular injury, stroke, myocardial infection, arrhythmia, kidney injury, radiation-related injury in the case of prolonged fluoroscopy use, emergency cardiac surgery, and death. The patient understands the risks of serious complication is 1-2 in 0050 with diagnostic cardiac cath and 1-2% or less with angioplasty/stenting.  I will check laboratory today.  He recently had a UroLift procedure done on May 7 and his warfarin was held for over 5 days prior to that procedure.  I will tentatively schedule him for the right and left heart cardiac catheterization to be done on 11/29/2019 and he will hold warfarin for 4 days prior to procedure.  Depending upon his INR today adjustments may be made to the above recommendation.  I have recommended that he use supplemental oxygen nocturnally and continue to use CPAP.   Troy Sine, MD, [facility]  11/24/2019 5:43 PM

## 2019-11-24 NOTE — Patient Instructions (Signed)
Medication Instructions:  CONTINUE WITH CURRENT MEDICATIONS. NO CHANGES.  *If you need a refill on your cardiac medications before your next appointment, please call your pharmacy*   Lab Work: CBC, INR,and CMET for PRE PROCEDURE If you have labs (blood work) drawn today and your tests are completely normal, you will receive your results only by: Troy Miller MyChart Message (if you have MyChart) OR . A paper copy in the mail If you have any lab test that is abnormal or we need to change your treatment, we will call you to review the results.  Covid screening prior to procedure: Must be done 3 days prior to cath. You will have to quarantine prior to this procedure. COVID TEST ON Thursday- 11/25/19 AT 10:20AM   Testing/Procedures: CATH. SEE BELOW   Follow-Up: At [facility], you and your health needs are our priority.  As part of our continuing mission to provide you with exceptional heart care, we have created designated Provider Care Teams.  These Care Teams include your primary Cardiologist (physician) and Advanced Practice Providers (APPs -  Physician Assistants and Nurse Practitioners) who all work together to provide you with the care you need, when you need it.  We recommend signing up for the patient portal called "MyChart".  Sign up information is provided on this After Visit Summary.  MyChart is used to connect with patients for Virtual Visits (Telemedicine).  Patients are able to view lab/test results, encounter notes, upcoming appointments, etc.  Non-urgent messages can be sent to your provider as well.   To learn more about what you can do with MyChart, go to [email or web address].    Your next appointment:   3 week(s)  The format for your next appointment:   In Person  Provider:   You may see Shelva Majestic, MD or one of the following Advanced Practice Providers on your designated Care Team:    Almyra Deforest, PA-C  Fabian Sharp, [place] or   Roby Lofts, [place]    Other  Instructions     [place] [address] Dept: [phone] Loc: [place]  11/24/2019  You are scheduled for a Cardiac Catheterization on Monday, May 24 with Dr. Shelva Majestic.  1. Please arrive at the [facility] (Main Entrance A) at [facility]: [address] [place], [place] [address] at 8:30 AM (This time is two hours before your procedure to ensure your preparation). Free valet parking service is available.   Special note: Every effort is made to have your procedure done on time. Please understand that emergencies sometimes delay scheduled procedures.  2. Diet: Do not eat solid foods after midnight.  The patient may have clear liquids until 5am upon the day of the procedure.  3. Labs: You will need to have blood drawn on Wednesday, May 19 at [place]  Open: 8am - 5pm (Lunch 12:30 - 1:30)   Phone: [phone]. You do not need to be fasting.  4. Medication instructions in preparation for your procedure:   Contrast Allergy: No    Current Outpatient Medications (Cardiovascular):  .  hydrochlorothiazide (MICROZIDE) 12.5 MG capsule, Take 12.5 mg by mouth daily. .  niacin (NIASPAN) 500 MG CR tablet, Take 2,000 mg by mouth every evening.  .  simvastatin (ZOCOR) 20 MG tablet, Take 1 tablet (20 mg total) by mouth daily at 6 PM. .  valsartan (DIOVAN) 80  MG tablet, Take 0.5 tablets (40 mg total) by mouth daily. TAKE 1/2 TABLET (40MG DAILY).  Current Outpatient Medications (Respiratory):  Troy Miller  Pirfenidone (ESBRIET) 267 MG TABS, Take 3 tablets (801 mg total) by mouth 3 (three) times daily.  Current Outpatient Medications (Analgesics):  .  acetaminophen (TYLENOL) 500 MG tablet, Take 1,000 mg by mouth every 6 (six) hours as needed for moderate pain or headache.  Troy Miller  aspirin 81 MG tablet, Take 81 mg by mouth  daily. Troy Miller  oxyCODONE-acetaminophen (PERCOCET) 10-325 MG tablet, Take 1 tablet by mouth 3 (three) times daily as needed for severe pain.  Current Outpatient Medications (Hematological):  .  folic acid (FOLVITE) 1 MG tablet, Take 1 mg by mouth every evening.  .  warfarin (COUMADIN) 5 MG tablet, Take 1 tablet by mouth daily or as directed by PCP  Current Outpatient Medications (Other):  .  calcium carbonate (OS-CAL) 600 MG TABS, Take 600 mg by mouth 2 (two) times daily with a meal. .  cephALEXin (KEFLEX) 500 MG capsule, Take 500 mg by mouth 3 (three) times daily. .  Cholecalciferol (VITAMIN D) 2000 units CAPS, Take 2,000 Units by mouth daily. .  famotidine (PEPCID) 20 MG tablet, TAKE 1 TABLET BY MOUTH EACH NIGHT AT BEDTIME AS DIRECTED .  finasteride (PROSCAR) 5 MG tablet, Take 5 mg by mouth daily. Troy Miller  gentamicin cream (GARAMYCIN) 0.1 %, APPLY SPARINGLY TO AFFECTED AREA(S) FOUR TIMES DAILY .  hydrOXYzine (ATARAX/VISTARIL) 25 MG tablet, Take 25 mg by mouth 3 (three) times daily. .  pantoprazole (PROTONIX) 40 MG tablet, TAKE 1 TABLET BY MOUTH ONCE DAILY 30 TO 60 MINUTES BEFORE FIRST MEAL OF THE DAY .  tamsulosin (FLOMAX) 0.4 MG CAPS capsule, Take 0.4 mg by mouth daily. *For reference purposes while preparing patient instructions.   Delete this med list prior to printing instructions for patient.*  Stop taking Coumadin (Warfarin) on Thursday, May 20. AND HOLD UNTIL PROCEDURE UNLESS WE CONTACT YOU WITH OTHER INSTRUCTIONS  Stop taking, HTCZ (Hydrochlorothiazide) Monday, May 24,  On the morning of your procedure, take your Aspirin and any morning medicines NOT listed above.  You may use sips of water.  5. Plan for one night stay--bring personal belongings. 6. Bring a current list of your medications and current insurance cards. 7. You MUST have a responsible person to drive you home. 8. Someone MUST be with you the first 24 hours after you arrive home or your discharge will be delayed. 9. Please  wear clothes that are easy to get on and off and wear slip-on shoes.  Thank you for allowing [place] to care for you!   -- [facility]

## 2019-11-24 NOTE — H&P (View-Only) (Signed)
Patient ID: Troy Miller, male   DOB: 01/02/1941, 78 y.o.   MRN: [id number]     HPI: Troy Miller is a 78 y.o. male who presents for a one month cardiology and sleep evaluation.   Troy Miller has a history of CAD and in 1994 underwent initial CABG revascularization surgery. In May 2005 he underwent redo surgery with a RIMA to the LAD, vein graft to distal RCA by Dr. Gerhardt. His previous LIMA graft was preserved and supplied the OM1 vessel. He has history of atrial flutter status post ablation by Dr. Allred as well as a history of PAF. He has documented peripheral vascular disease with abdominal aortic aneurysm, as well as common iliac artery aneurysms. He status post right fem-tib bypass graft surgery. Additional problems include hypertension as well as mixed hyperlipidemia. He also has mild aortic stenosis and documentation of an infrarenal abdominal aortic aneurysm.   A followup echo Doppler study on 05/11/2013 showed an ejection fraction in the 45-50% range with mild inferior hypokinesis.  There was grade 1 diastolic dysfunction.  His aortic valve was moderately calcified and there was evidence for mild aortic valve stenosis with a peak gradient of 19 and mean gradient of 10. Aortic valve area was 1.5 1.64 cm.  There was mild aortic root dilatation at 42 mm mild dilatation of the ascending aorta..  There was mitral regurgitation.  A follow-up abdominal aortic Doppler study on April 29, 2014 demonstrated an infrarenal fusiform aneurysm at 3.63.7 with mild amount of atherosclerosis visualized throughout.  The right and left proximal common iliac arteries also demonstrated aneurysmal dilatation.  The right common iliac artery measured 2.3.  A 2.3 without evidence for stenosis.  Left common iliac artery measured 3.2 x 3.0 with mild amount of thrombus and low flow velocity.  He underwent a follow-up ultrasound last week which showed slight progression.  His aneurysm in the distal aorta, now  measured 4.03.7.  There was a new finding of a right common iliac artery aneurysm measuring 2.2 by 2.3 cm.  The left common iliac artery was stable at 3.2 x 3 point 0 cm.  The IVC veins were patent.  A follow-up evaluation was recommended in one year.  A follow-up echo Doppler study on 11/04/2014 demonstrated an EF 50-55% without regional wall motion abnormalities.  There was evidence for mild aortic valve stenosis valve area of 1. 4 9 and 1.43 by measurements. Mean gradient was 14 and peak gradient 32 mmHg.  He saw Scott Weaver with complaints of chest discomfort in 2016.  A nuclear perfusion study was  on 02/10/2015:  Ejection fraction was 47%. There was evidence for prior inferior perfusion defect suggesting prior infarction.  There was no ischemia.  He underwent several treatment of skin lesions on his face by his dermatologist and will need additional treatment for 1.  On his nose which he states was positive for mild malignancy. He denies anginal symptoms.  He denies PND, orthopnea.  He denies bleeding.   Troy Miller has a history of obstructive sleep apnea dating back to 2005.  His initial sleep study showed severe sleep apnea with an AHI of 33.9, and in addition to obstructive events had central events.  Initial O2 desaturation was 81%.  Remotely he had seen Dr. Domeier, he had undergone a trial of BiPAP, but reportedly had felt better with CPAP therapy.  He has not used CPAP therapy for at least 3-4 years.  His DME company had been [employer] which is   no longer in business.  He admits to daytime sleepiness.  He does snore.  He denies any recent CHF symptomatology.   I referred him for a new sleep study which was done on 12/22/2015 and confirmed moderate sleep apnea with an AHI overall of 26 per hour.  However, sleep apnea was severe during REM sleep with an AHI 51.4.  He had significant oxygen desaturation to 82% in time below 88% was 76.4 minutes.  There was moderate snoring.  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  He received  a new CPAP machine.  He admits to 100% compliance.  His sleep has improved.  He denies daytime sleepiness and is unaware of breakthrough snoring.  I  saw him in September 2018 [facility]  Remotely he developed cough secondary to ace inhibition.  He is seeing Dr. 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  Occasionally he require supplemental oxygen if he is walking but typically does not use oxygen routinely.    An echo Doppler study from December 26, 2017 showed an EF of 35 to 40% with to mid inferolateral inferior inferoseptal mild hypokinesis.  There was grade 1 diastolic dysfunction.  There was mild aortic stenosis with a mean gradient of 21 and peak gradient of 31.  The ascending aorta was felt to be mildly dilated.  His RV was mildly dilated.  He had mild increased PA pressure at 34 mmHg. He underwent abdominal aortic Doppler to assess his abdominal aortic aneurysm on January 16, 2018  which showed the largest diameter 3.7 cm, essentially unchanged from prior exams.  Lower extremity duplex showed normal staying ABIs at his right toe brachial index was abnormal as well as his left toe brachial index.  He is being evaluated by Dr. Ramaswamey for intersitital pulmonary fibrosis and was started on Esbriet.  His LFTs have been normal.  He has a history of significant mixed hyperlipidemia and for this reason had been on combination niacin with statin.  ALT on March 31, 2018 was normal at 7 and AST was 13.  He previously had been on omega-3 fatty acids but apparently these were discontinued by Dr. Wert according to the patient because of his asbestosis.  He had not had any recent lipid studies.   He underwent lower extremity PV angiography and intervention by Dr. Brabham on May 05, 2018 which demonstrated aneurysmal degeneration of the infrarenal  abdominal aorta and bilateral iliac arteries. The right common femoral profundofemoral artery were heavily calcified but patent.  The SFA was occluded.  A bypass graft originating from the SFA had a proximal stenosis and luminal irregularity at the anastomosis. Runoff was to the peroneal artery.  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  He underwent stent to the right SFA and a stent to the left popliteal artery.  He was evaluated in a telemedicine visit on October 26, 2018.  He believes his blood pressure has been controlled.  He continues to be on valsartan/HCTZ 80/12.5. Recent lipid studies were excellent on his current regimen with simvastatin and niacin with total cholesterol 109 triglycerides 106 HDL 45 LDL 43.  He had been on Xarelto 20 mg and has a remote history of atrial fibrillation ablation.  He has been maintaining sinus rhythm.  Unfortunately   the cost of Xarelto was too much and he was having to pay $266 per month out of pocket.  As result he was switched to warfarin Last week he had called the office and was to warfarin.    Since his April telemedicine evaluation, he believes he has done fairly well. He is on supplemental oxygen 2 L/min at home.  However he has a portable oxygen unit which he takes when he walks and typically it is set at 5 L/min.  He is followed by Dr. Ramaswamy.  He underwent high-resolution CT of his chest in follow-up of his interstitial lung disease and stage I right lower lobe lung cancer treated with radiation therapy concluded in March 2019.  The September 17, 2018 CT demonstrated evolving radiation fibrosis in the posterior right lower lobe without evidence for local tumor recurrence.  He had basilar predominant fibrotic interstitial lung disease with mild honeycombing slightly progressed since 2018.  There also was stable calcified bilateral pleural plaques and smooth pleural thickening without  effusion.  He underwent follow-up vascular imaging studies in July and a Doppler of his abdominal aorta showed dilation of the distal aorta now measuring 3.9 cm compared to 3.6 cm in July 2019.  There was mild dilation of the right common iliac and left common iliac arteries.  Lower extremity Doppler study showed widely patent right proximal SFA stent without restenosis and a widely patent right proximal SFA to proximal peroneal artery bypass graft without restenosis.  The distal left CFA measured 1.8 cm.  There was a widely patent left proximal SFA to proximal TPT stent without stenosis.  He denies any anginal symptoms.  He does get short of breath with walking.  An echo Doppler study and profile showed mild improvement in LV function with EF now at 40 to 45%.  Aortic stenosis was moderate with a peak gradient of 33, mean gradient of 23, and aortic valve area of 1.3 cm.  He admits to occasional palpitations but denies presyncope.    I last saw him on August 23, 2019.  Since his prior evaluation in November 2020 he had worn  a cardiac monitor for 2 weeks.  This demonstrated predominant sinus rhythm with an average heart rate at 65 bpm.  The slowest heart rate was sinus bradycardia at 47 which occurred at 12:50 AM on June 21, 2019 and sinus tachycardia at 111 bpm at which occurred at 8:23 AM on December 15.  He was found to have 4 episodes of arrhythmia with 2 episodes of atrial fibrillation with multifocal PVCs, and 2 episodes of atrial flutter with variable block.  He is now on supplemental oxygen.  He has a long history of prior asbestosis exposure.  He had not been using his CPAP.  [facility]  He is on warfarin anticoagulation.  He has been on valsartan HCT 80/12.5 for hypertension.  He is on finasteride and tamsulosin for prostate issues.  He is on Esbriet for his interstitial lung disease.  During his evaluation with me, his blood pressure was low at 82/55 supine and 80/56  standing and I recommended he discontinue valsartan HCT 80/12.5 mg daily and in its place initiated valsartan 40 mg alone.  He was evaluated by Dr. Ramaswamy in April 2021 for his idiopathic pulmonary fibrosis.  He underwent a follow-up echo Doppler study on October 18, 2019.  EF was further depressed now at 20 to 25% and there was global hypokinesis.  Left ventricular internal   dimensions were internal dimensions were  dilated at 6.3 cm at end diastole and 5.0 cm and in systole.  Aortic root diameter was 4.1 cm.  It is in the low EF, peak and mean gradients were 26 and 12 mm respectively.  His LVOT/AV VTI ratio was 0.22 consistent with severe AS.   I saw him for follow-up evaluation of his echo Doppler study on 10/25/2019.  During that time he denied any chest pain.  He continues to use supplemental oxygen.  He had noticed progressive shortness of breath.  I reviewed his echo Doppler studies with him in detail and he had severe reduction in aortic valve excursion and I raise concern that he may very well have low gradient severe aortic valve stenosis which may be contributing to his reduced LV function.  I also discussed the possibility of progressive coronary obstructive disease leading also to reduced LV function and undoubtably that his idiopathic pulmonary fibrosis was also contributing to his progressive dyspnea.  During that evaluation I discussed possible right and left heart cardiac catheterization for definitive evaluation.  I recommended that he contemplate undergoing this procedure and had discussed with him due to significant comorbidities he would not be a good candidate for third CABG surgery or surgical aortic valve replacement but TAVR may be a potential option if was severe.  Over the last month, he notes he has become a little more shortness of breath.  He continues to be on 3 L of oxygen typically at night and when he is active.  He has not been using CPAP with consistency.  He has previously  documented sleep apnea.  At times he also states he has not been 100% using his supplemental oxygen at night.  He presents for follow-up evaluation.  Past Medical History:  Diagnosis Date  . AAA (abdominal aortic aneurysm) ([place])   . Aortic stenosis, mild    07/10/12 EF 50-55% on echo-mitral annular ca+ also  . Atrial fibrillation ([place])    03/19/10 ablation-[name]  . Atrial flutter ([place])   . CAD (coronary artery disease)   . DVT (deep venous thrombosis) ([place])   . History of stress test    Myoview 8/16:  EF 47%, inferior scar, no ischemia, Intermediate risk (low EF)  . Hyperlipemia   . Hypertension   . Iliac artery aneurysm ([place])   . PVD (peripheral vascular disease) ([place])    remote right fem-tib bypass graft sugery  . 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 ABDOMINAL AORTOGRAM W/LOWER EXTREMITY N/A 02/18/2017   Procedure: ABDOMINAL AORTOGRAM W/LOWER EXTREMITY;  Surgeon: Serafina Mitchell, MD;  Location: [facility];  Service: Cardiovascular;  Laterality: N/A;  . ABDOMINAL AORTOGRAM W/LOWER EXTREMITY N/A 05/05/2018   Procedure: ABDOMINAL AORTOGRAM W/LOWER EXTREMITY;  Surgeon: Serafina Mitchell, MD;  Location: [facility];  Service: Cardiovascular;  Laterality: N/A;  . BACK SURGERY  2003  . CARDIAC ELECTROPHYSIOLOGY STUDY AND ABLATION  03/19/10  . CARDIOVERSION  08/16/08  . CHOLECYSTECTOMY  08/13/02  . CORONARY ARTERY BYPASS GRAFT  1994 & 2005  . FEMORAL-TIBIAL BYPASS GRAFT     Remote  . HERNIA REPAIR  06/03/07  . PERIPHERAL VASCULAR INTERVENTION Left 02/18/2017   Procedure: PERIPHERAL VASCULAR INTERVENTION;  Surgeon: Serafina Mitchell, MD;  Location: [facility];  Service: Cardiovascular;  Laterality: Left;  POPLITEAL  . PERIPHERAL VASCULAR INTERVENTION Bilateral 05/05/2018   Procedure:  PERIPHERAL VASCULAR INTERVENTION;  Surgeon: Brabham, Vance W, MD;  Location: [facility];   Service: Cardiovascular;  Laterality: Bilateral;  Rt fem/pop graft and Lt popliteal  . VASECTOMY  01/28/08    Allergies  Allergen Reactions  . Fentanyl   . Fentanyl And Related Other (See Comments)    Hallucinations    Current Outpatient Medications  Medication Sig Dispense Refill  . acetaminophen (TYLENOL) 500 MG tablet Take 1,000 mg by mouth every 6 (six) hours as needed for moderate pain or headache.     . aspirin 81 MG tablet Take 81 mg by mouth daily.    . calcium carbonate (OS-CAL) 600 MG TABS Take 600 mg by mouth 2 (two) times daily with a meal.    . cephALEXin (KEFLEX) 500 MG capsule Take 500 mg by mouth 3 (three) times daily.    . Cholecalciferol (VITAMIN D) 2000 units CAPS Take 2,000 Units by mouth daily.    . famotidine (PEPCID) 20 MG tablet TAKE 1 TABLET BY MOUTH EACH NIGHT AT BEDTIME AS DIRECTED 90 tablet 0  . finasteride (PROSCAR) 5 MG tablet Take 5 mg by mouth daily.    . folic acid (FOLVITE) 1 MG tablet Take 1 mg by mouth every evening.     . gentamicin cream (GARAMYCIN) 0.1 % APPLY SPARINGLY TO AFFECTED AREA(S) FOUR TIMES DAILY    . hydrochlorothiazide (MICROZIDE) 12.5 MG capsule Take 12.5 mg by mouth daily.    . hydrOXYzine (ATARAX/VISTARIL) 25 MG tablet Take 25 mg by mouth 3 (three) times daily.    . niacin (NIASPAN) 500 MG CR tablet Take 2,000 mg by mouth every evening.     . oxyCODONE-acetaminophen (PERCOCET) 10-325 MG tablet Take 1 tablet by mouth 3 (three) times daily as needed for severe pain.    . pantoprazole (PROTONIX) 40 MG tablet TAKE 1 TABLET BY MOUTH ONCE DAILY 30 TO 60 MINUTES BEFORE FIRST MEAL OF THE DAY 90 tablet 0  . Pirfenidone (ESBRIET) 267 MG TABS Take 3 tablets (801 mg total) by mouth 3 (three) times daily. 810 tablet 5  . simvastatin (ZOCOR) 20 MG tablet Take 1 tablet (20 mg total) by mouth daily at 6 PM. 90 tablet 2  . tamsulosin (FLOMAX) 0.4 MG CAPS capsule Take 0.4 mg by mouth daily.    . valsartan (DIOVAN) 80 MG tablet Take 0.5 tablets (40 mg  total) by mouth daily. TAKE 1/2 TABLET (40MG DAILY). 90 tablet 3  . warfarin (COUMADIN) 5 MG tablet Take 1 tablet by mouth daily or as directed by PCP 45 tablet 0   No current facility-administered medications for this visit.    Social History   Socioeconomic History  . Marital status: Single    Spouse name: Not on file  . Number of children: Not on file  . Years of education: Not on file  . Highest education level: Not on file  Occupational History  . Not on file  Tobacco Use  . Smoking status: Former Smoker    Packs/day: 2.00    Years: 25.00    Pack years: 50.00    Types: Cigarettes    Quit date: 04/26/1986    Years since quitting: 33.6  . Smokeless tobacco: Never Used  Substance and Sexual Activity  . Alcohol use: No    Alcohol/week: 0.0 standard drinks  . Drug use: No  . Sexual activity: Not on file  Other Topics Concern  . Not on file  Social History Narrative  . Not on   file   Social Determinants of Health   Financial Resource Strain:   . Difficulty of Paying Living Expenses:   Food Insecurity:   . Worried About Running Out of Food in the Last Year:   . Ran Out of Food in the Last Year:   Transportation Needs:   . Lack of Transportation (Medical):   . Lack of Transportation (Non-Medical):   Physical Activity:   . Days of Exercise per Week:   . Minutes of Exercise per Session:   Stress:   . Feeling of Stress :   Social Connections:   . Frequency of Communication with Friends and Family:   . Frequency of Social Gatherings with Friends and Family:   . Attends Religious Services:   . Active Member of Clubs or Organizations:   . Attends Club or Organization Meetings:   . Marital Status:   Intimate Partner Violence:   . Fear of Current or Ex-Partner:   . Emotionally Abused:   . Physically Abused:   . Sexually Abused:     Family History  Problem Relation Age of Onset  . Lung cancer Mother        smoked  . Heart failure Father   . Stroke Sister     Socially he is divorced has 3 children and 2 grandchildren. He is not routine exercise. No tobacco or alcohol use.  ROS General: Negative; No fevers, chills, or night sweats;  HEENT: Now with a hearing aid in his right ear due to decreased hearing., sinus congestion, difficulty swallowing Pulmonary: Shortness of breath, positive for asbestosis and interstitial pulmonary fibrosis Cardiovascular:  See HPI: No change in claudication GI: Negative; No nausea, vomiting, diarrhea, or abdominal pain GU: Negative; No dysuria, hematuria, or difficulty voiding Musculoskeletal: Negative; no myalgias, joint pain, or weakness Hematologic/Oncology: Negative; no easy bruising, bleeding Endocrine: Negative; no heat/cold intolerance; no diabetes Neuro: Occasional transient toe paresthesias; no changes in balance, headaches Skin: Negative; No rashes or skin lesions Psychiatric: Negative; No behavioral problems, depression Sleep: Positive for sleep apnea, currently untreated for the past 3-4 years.  Originally diagnosed in 2005, both obstructive and central events.  Positive for daytime sleepiness, hypersomnolence; no bruxism, restless legs, hypnogognic hallucinations, no cataplexy.  [facility]. Other comprehensive 14 point system review is negative.  PE BP 94/60 (BP Location: Right Arm, Patient Position: Sitting, Cuff Size: Normal)   Pulse (!) 110   Ht 5' 10.5" (1.791 m)   Wt 174 lb (78.9 kg)   BMI 24.61 kg/m    Repeat blood pressure by me was 110/66  Wt Readings from Last 3 Encounters:  11/24/19 174 lb (78.9 kg)  10/25/19 182 lb (82.6 kg)  08/23/19 178 lb 9.6 oz (81 kg)   General: Alert, oriented, no distress.  Skin: normal turgor, no rashes, warm and dry HEENT: Normocephalic, atraumatic. Pupils equal round and reactive to light; sclera anicteric; extraocular muscles intact;  Nose without nasal septal hypertrophy Mouth/Parynx benign; Mallinpatti scale  3 Neck: No JVD, no carotid bruits; normal carotid upstroke Lungs: clear to ausculatation and percussion; no wheezing or rales Chest wall: without tenderness to palpitation Heart: PMI not displaced, RRR, s1 s2 normal, 2/6 mid-to-late peaking systolic murmur, no diastolic murmur, no rubs, gallops, thrills, or heaves Abdomen: soft, nontender; no hepatosplenomehaly, BS+; abdominal aorta nontender and not dilated by palpation. Back: no CVA tenderness Pulses 2+ Musculoskeletal: full range of motion, normal strength, no joint deformities Extremities: no clubbing cyanosis or edema,   Homan's sign negative  Neurologic: grossly nonfocal; Cranial nerves grossly wnl Psychologic: Normal mood and affect   ECG (independently read by me): Sinus tachycardia ay 110; PAC, RBBB; LAHB; PR 148 msec  October 25, 2019 ECG (independently read by me):Sinus tahycardia at 107;RBBB, no ectopy; 1st degree AV block with PR 225 msec; QTc 512 msec    February 15, 2021ECG (independently read by me): Sinus rhythm with PACs in an atrial bigeminal pattern  November 2020 ECG (independently read by me): Sinus rhythm with suggestion of intermittent second-degree Mobitz type I block.  Left axis deviation.  Nonspecific interventricular block.  QTc interval 435 ms.  September 2019 ECG (independently read by me): Sinus rhythm with sinus arrhythmia, isolated PVC.  Left anterior hemiblock.  Early transition with prominent R wave in V1 and V2  September 2018 ECG (independently read by me): sinus bradycardia 51 bpm.  Left axis deviation.  Inferior Q waves with early transition.  Normal intervals.  April 2017 ECG (independently read by me): Normal sinus rhythm with PACs.  Heart rate 62 bpm.  Inferior posterior MI  November 2016 ECG (independently read by me):  Normal sinus rhythm at 62 bpm.   Incomplete right bundle branch block.  Prior ECG (independently read by me): Sinus bradycardia at 58 bpm with an isolate a PVC and occasional PACs  with mild sinus arrhythmia.  Evidence for prior inferior posterior infarct.  Nonspecific ST changes.  Prior 11/05/2013 ECG (independently read by me): Sinus bradycardia at 49 beats per minute.  QTc interval 426 ms.  PR interval 174 ms.  Mild RV conduction delay  Prior 04/26/2013 ECG: Marked sinus bradycardia 43 beats per minute. Left axis deviation. 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  LABS:  BMP Latest Ref Rng & Units 05/05/2018 04/10/2018 11/28/2017  Glucose 70 - 99 mg/dL 105(H) 102(H) 115(H)  BUN 8 - 23 mg/dL 25(H) 17 23  Creatinine 0.61 - 1.24 mg/dL 1.50(H) 1.07 1.07  BUN/Creat Ratio 10 - 24 - 16 -  Sodium 135 - 145 mmol/L 140 138 137  Potassium 3.5 - 5.1 mmol/L 3.9 4.2 3.8  Chloride 98 - 111 mmol/L 102 99 104  CO2 20 - 29 mmol/L - 21 23  Calcium 8.6 - 10.2 mg/dL - 9.8 10.2   Hepatic Function Latest Ref Rng & Units 06/22/2019 12/10/2018 04/10/2018  Total Protein 6.0 - 8.3 g/dL 7.9 7.7 7.6  Albumin 3.5 - 5.2 g/dL 4.1 4.4 4.4  AST 0 - 37 U/L _0 ALT 0 - 53 U/L _1 Alk Phosphatase 39 - 117 U/L 65 63 74  Total Bilirubin 0.2 - 1.2 mg/dL 0.4 0.4 0.4  Bilirubin, Direct 0.0 - 0.3 mg/dL 0.0 0.1 -   CBC Latest Ref Rng & Units 05/05/2018 08/25/2017 03/25/2017  WBC 4.0 - 10.5 K/uL - 8.1 9.9  Hemoglobin 13.0 - 17.0 g/dL 15.0 15.3 15.9  Hematocrit 39.0 - 52.0 % 44.0 46.2 47.9  Platelets 150 - 400 K/uL - 144(L) 157.0   Lab Results  Component Value Date   MCV 95.7 08/25/2017   MCV 94.0 03/25/2017   MCV 91.1 01/21/2017   No results found for: HGBA1C  Lab Results  Component Value Date   TSH 2.870 04/10/2018   Lipid Panel     Component Value Date/Time   CHOL 109 04/10/2018 0827   CHOL 118 05/05/2013 0839   TRIG 106 04/10/2018 0827   TRIG 202 (H) 05/05/2013 0839   HDL 45 04/10/2018 0827  HDL 38 (L) 05/05/2013 0839   CHOLHDL 2.4 04/10/2018 0827   CHOLHDL 2.6 05/20/2014 0823   VLDL 27 05/20/2014 0823   LDLCALC 43 04/10/2018 0827   LDLCALC  40 05/05/2013 0839     RADIOLOGY: No results found.  Cardiac Studies: The patient wore an event monitor from December 10 through June 30, 2019.  The predominant rhythm was sinus rhythm with an average rate at 65 bpm.  The slowest heart rate was sinus bradycardia at 47 bpm which occurred on December 14 at 12:50 AM.  The fastest heart beat was sinus tachycardia on December 15 which occurred at 8:23 AM.  The patient had several episodes of atrial fibrillation with multifocal PVCs, as well as atrial flutter with variable block with PVCs which were auto triggered. The  minimum heart rate in A. fib was 56 bpm with maximum heart rate 97 bpm.  There were no instances of heart block, prolonged pauses.  There was an isolated multifocal atrial couplet but no episodes of VT.   ECHO: 10/18/2019 MPRESSIONS  1. Left ventricular ejection fraction, by estimation, is 20 to 25%. The  left ventricle has severely decreased function. The left ventricle  demonstrates global hypokinesis. The left ventricular internal cavity size  was severely dilated. There is mild  left ventricular hypertrophy. Left ventricular diastolic parameters are  indeterminate.  2. Right ventricular systolic function is severely reduced. The right  ventricular size is severely enlarged. There is mildly elevated pulmonary  artery systolic pressure.  3. Right atrial size was mild to moderately dilated.  4. The mitral valve is abnormal. Mild mitral valve regurgitation.  5. AV is thickened, calcified with restricted motion Peak and mean  gradients through the vale are 26 and 12 mm Hg respectively. LVOT / AV VTI  ratio is 0.22 consistent with severe AS. . The aortic valve is abnormal.  Aortic valve regurgitation is not  visualized.  6. The inferior vena cava is normal in size with <50% respiratory  variability, suggesting right atrial pressure of 8 mmHg.  7. Compared to echo from October 2020, LVEF and RVEF are depressed.     IMPRESSION: 1. CAD in native artery   2. Severe aortic stenosis   3. Hx of CABG   4. Paroxysmal atrial fibrillation (HCC)   5. PVD (peripheral vascular disease) (HCC)   6. HFrEF (heart failure with reduced ejection fraction) (HCC)   7. IPF (idiopathic pulmonary fibrosis) (HCC)   8. Chronic anticoagulation   9. AAA (abdominal aortic aneurysm) without rupture (HCC)     ASSESSMENT AND PLAN: Troy Miller is a 78-year-old Caucasian male who has CAD dating back to 1994 when he underwent his initial CABG surgery and required redo bypass surgery in 2005. He has peripheral vascular disease.   His last nuclear study revealed inferior scar without associated ischemia. 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  He is status post remote right fem-tib bypass graft surgery in 1996. He is followed  by Dr. Brabham.  His most recent peripheral vascular Doppler studies which showed patent stents in the right proximal SFA, right proximal SFA to peroneal  artery bypass graft as well as left proximal SFA to proximal TPT stent. A 2-week Holter monitor  demonstrated predominant sinus rhythm but he was found to have several episodes of atrial flutter and atrial fibrillation with PVCs.  Fastest A. fib   rate was 97 bpm with longest duration at 21 minutes.  He is on warfarin anticoagulation.  When I saw him February 2020 was sent and medications were adjusted.  I again reviewed his most recent echo Doppler study which showed an EF reduction of 20 to 25% which raise concern for low gradient severe aortic stenosis.  Over the past month he has continued to note shortness of breath with minimal activity to the point where he does not do much at all.  He continues to be on Esbriet for his idiopathic pulmonary fibrosis and is on supplemental oxygen with activity and night.  He  has made the decision that he would like to pursue definitive evaluation.  I again discussed with him at length today with his son present the potential for his aortic stenosis contributing to his marked LV dysfunction as well as progressive CAD contributing as well to potential ischemic cardiomyopathy.  His shortness of breath is also undoubtedly contributed by his pulmonary fibrosis.  I had a lengthy discussion with him concerning the risk benefits of right and left heart cardiac catheterization.I have reviewed the risks, indications, and alternatives to cardiac catheterization, possible angioplasty, and stenting with the patient. Risks include but are not limited to bleeding, infection, vascular injury, stroke, myocardial infection, arrhythmia, kidney injury, radiation-related injury in the case of prolonged fluoroscopy use, emergency cardiac surgery, and death. The patient understands the risks of serious complication is 1-2 in 8333 with diagnostic cardiac cath and 1-2% or less with angioplasty/stenting.  I will check laboratory today.  He recently had a UroLift procedure done on May 7 and his warfarin was held for over 5 days prior to that procedure.  I will tentatively schedule him for the right and left heart cardiac catheterization to be done on 11/29/2019 and he will hold warfarin for 4 days prior to procedure.  Depending upon his INR today adjustments may be made to the above recommendation.  I have recommended that he use supplemental oxygen nocturnally and continue to use CPAP.   Troy Sine, MD, [facility]  11/24/2019 5:43 PM

## 2019-11-25 ENCOUNTER — Other Ambulatory Visit (HOSPITAL_COMMUNITY)
Admission: RE | Admit: 2019-11-25 | Discharge: 2019-11-25 | Disposition: A | Discharge status: Home / Self Care | Payer: Medicare Other | Source: Ambulatory Visit | Attending: Cardiovascular Disease | Admitting: Cardiovascular Disease

## 2019-11-25 ENCOUNTER — Telehealth: Payer: Self-pay | Admitting: *Deleted

## 2019-11-25 DIAGNOSIS — Z20822 Contact with and (suspected) exposure to covid-19: Secondary | ICD-10-CM | POA: Diagnosis not present

## 2019-11-25 DIAGNOSIS — Z01812 Encounter for preprocedural laboratory examination: Secondary | ICD-10-CM | POA: Insufficient documentation

## 2019-11-25 LAB — COMPREHENSIVE METABOLIC PANEL
ALT: 10 IU/L (ref 0–44)
AST: 17 IU/L (ref 0–40)
Albumin/Globulin Ratio: 1.2 (ref 1.2–2.2)
Albumin: 4.4 g/dL (ref 3.7–4.7)
Alkaline Phosphatase: 110 IU/L (ref 48–121)
BUN/Creatinine Ratio: 15 (ref 10–24)
BUN: 20 mg/dL (ref 8–27)
Bilirubin Total: 0.5 mg/dL (ref 0.0–1.2)
CO2: 22 mmol/L (ref 20–29)
Calcium: 9.7 mg/dL (ref 8.6–10.2)
Chloride: 103 mmol/L (ref 96–106)
Creatinine, Ser: 1.37 mg/dL — ABNORMAL HIGH (ref 0.76–1.27)
GFR calc Af Amer: 56 mL/min/{1.73_m2} — ABNORMAL LOW (ref >59)
GFR calc non Af Amer: 48 mL/min/{1.73_m2} — ABNORMAL LOW (ref >59)
Globulin, Total: 3.6 g/dL (ref 1.5–4.5)
Glucose: 115 mg/dL — ABNORMAL HIGH (ref 65–99)
Potassium: 4.6 mmol/L (ref 3.5–5.2)
Sodium: 141 mmol/L (ref 134–144)
Total Protein: 8 g/dL (ref 6.0–8.5)

## 2019-11-25 LAB — CBC
Hematocrit: 41.1 % (ref 37.5–51.0)
Hemoglobin: 12.3 g/dL — ABNORMAL LOW (ref 13.0–17.7)
MCH: 21.3 pg — ABNORMAL LOW (ref 26.6–33.0)
MCHC: 29.9 g/dL — ABNORMAL LOW (ref 31.5–35.7)
MCV: 71 fL — ABNORMAL LOW (ref 79–97)
Platelets: 233 10*3/uL (ref 150–450)
RBC: 5.78 x10E6/uL (ref 4.14–5.80)
RDW: 17.7 % — ABNORMAL HIGH (ref 11.6–15.4)
WBC: 10 10*3/uL (ref 3.4–10.8)

## 2019-11-25 LAB — TSH: TSH: 3.13 u[IU]/mL (ref 0.450–4.500)

## 2019-11-25 LAB — PROTIME-INR
INR: 1.3 — ABNORMAL HIGH (ref 0.9–1.2)
Prothrombin Time: 14.3 s — ABNORMAL HIGH (ref 9.1–12.0)

## 2019-11-25 NOTE — Telephone Encounter (Signed)
Pt contacted pre-catheterization scheduled at [facility] for: Monday Nov 29, 2019 10:30 AM Verified arrival time and place: [facility]) at: 8:30 AM   No solid food after midnight prior to cath, clear liquids until 5 AM day of procedure.  Hold: Coumadin-none 11/25/19 until post procedure. HCTZ-day before and day of procedure-GFR 48 Valsartan-day before and day of procedure-GFR 48  Except hold medications AM meds can be  taken pre-cath with sip of water including: ASA 81 mg   Confirmed patient has responsible adult to drive home post procedure and observe 24 hours after arriving home: yes  You are allowed ONE visitor in the waiting room during your procedure. Both you and your visitor must wear masks.      COVID-19 Pre-Screening Questions:  . In the past 7 to 10 days have you had a cough,  shortness of breath, headache, congestion, fever (100 or greater) body aches, chills, sore throat, or sudden loss of taste or sense of smell? no . Have you been around anyone with known Covid 19 in the past 7 to 10 days? no . Have you been around anyone who is awaiting Covid 19 test results in the past 7 to 10 days? no . Have you been around anyone who has mentioned symptoms of Covid 19 within the past 7 to 10 days? no   Reviewed procedure/mask/visitor instructions, COVID-19 screening questions with patient.

## 2019-11-26 ENCOUNTER — Other Ambulatory Visit: Payer: Self-pay

## 2019-11-26 LAB — SARS CORONAVIRUS 2 (TAT 6-24 HRS): SARS Coronavirus 2: NEGATIVE

## 2019-11-26 NOTE — Progress Notes (Signed)
Opened in error

## 2019-11-29 ENCOUNTER — Encounter (HOSPITAL_COMMUNITY)
Admission: RE | Disposition: A | Discharge status: Home / Self Care | Payer: Self-pay | Source: Home / Self Care | Attending: Cardiovascular Disease

## 2019-11-29 ENCOUNTER — Ambulatory Visit (HOSPITAL_COMMUNITY)
Admission: RE | Admit: 2019-11-29 | Discharge: 2019-11-29 | Disposition: A | Discharge status: Home / Self Care | Payer: Medicare Other | Attending: Cardiovascular Disease | Admitting: Cardiovascular Disease

## 2019-11-29 ENCOUNTER — Other Ambulatory Visit: Payer: Self-pay

## 2019-11-29 DIAGNOSIS — I723 Aneurysm of iliac artery: Secondary | ICD-10-CM | POA: Diagnosis not present

## 2019-11-29 DIAGNOSIS — Z79899 Other long term (current) drug therapy: Secondary | ICD-10-CM | POA: Insufficient documentation

## 2019-11-29 DIAGNOSIS — I714 Abdominal aortic aneurysm, without rupture: Secondary | ICD-10-CM | POA: Insufficient documentation

## 2019-11-29 DIAGNOSIS — I255 Ischemic cardiomyopathy: Secondary | ICD-10-CM | POA: Diagnosis present

## 2019-11-29 DIAGNOSIS — Z86718 Personal history of other venous thrombosis and embolism: Secondary | ICD-10-CM | POA: Insufficient documentation

## 2019-11-29 DIAGNOSIS — Z8249 Family history of ischemic heart disease and other diseases of the circulatory system: Secondary | ICD-10-CM | POA: Diagnosis not present

## 2019-11-29 DIAGNOSIS — I110 Hypertensive heart disease with heart failure: Secondary | ICD-10-CM | POA: Diagnosis not present

## 2019-11-29 DIAGNOSIS — I480 Paroxysmal atrial fibrillation: Secondary | ICD-10-CM | POA: Insufficient documentation

## 2019-11-29 DIAGNOSIS — Z7982 Long term (current) use of aspirin: Secondary | ICD-10-CM | POA: Diagnosis not present

## 2019-11-29 DIAGNOSIS — Z7901 Long term (current) use of anticoagulants: Secondary | ICD-10-CM | POA: Diagnosis not present

## 2019-11-29 DIAGNOSIS — I5020 Unspecified systolic (congestive) heart failure: Secondary | ICD-10-CM | POA: Insufficient documentation

## 2019-11-29 DIAGNOSIS — I2721 Secondary pulmonary arterial hypertension: Secondary | ICD-10-CM | POA: Diagnosis not present

## 2019-11-29 DIAGNOSIS — Z885 Allergy status to narcotic agent status: Secondary | ICD-10-CM | POA: Diagnosis not present

## 2019-11-29 DIAGNOSIS — I739 Peripheral vascular disease, unspecified: Secondary | ICD-10-CM | POA: Diagnosis not present

## 2019-11-29 DIAGNOSIS — I2510 Atherosclerotic heart disease of native coronary artery without angina pectoris: Secondary | ICD-10-CM | POA: Insufficient documentation

## 2019-11-29 DIAGNOSIS — I25810 Atherosclerosis of coronary artery bypass graft(s) without angina pectoris: Secondary | ICD-10-CM

## 2019-11-29 DIAGNOSIS — Z87891 Personal history of nicotine dependence: Secondary | ICD-10-CM | POA: Insufficient documentation

## 2019-11-29 DIAGNOSIS — E782 Mixed hyperlipidemia: Secondary | ICD-10-CM | POA: Diagnosis not present

## 2019-11-29 DIAGNOSIS — J84112 Idiopathic pulmonary fibrosis: Secondary | ICD-10-CM | POA: Diagnosis not present

## 2019-11-29 DIAGNOSIS — I350 Nonrheumatic aortic (valve) stenosis: Secondary | ICD-10-CM

## 2019-11-29 DIAGNOSIS — I4892 Unspecified atrial flutter: Secondary | ICD-10-CM | POA: Diagnosis not present

## 2019-11-29 DIAGNOSIS — Z951 Presence of aortocoronary bypass graft: Secondary | ICD-10-CM | POA: Insufficient documentation

## 2019-11-29 HISTORY — PX: RIGHT/LEFT HEART CATH AND CORONARY/GRAFT ANGIOGRAPHY: CATH118267

## 2019-11-29 LAB — POCT I-STAT 7, (LYTES, BLD GAS, ICA,H+H)
Acid-base deficit: 4 mmol/L — ABNORMAL HIGH (ref 0.0–2.0)
Bicarbonate: 20.6 mmol/L (ref 20.0–28.0)
Calcium, Ion: 1.28 mmol/L (ref 1.15–1.40)
HCT: 39 % (ref 39.0–52.0)
Hemoglobin: 13.3 g/dL (ref 13.0–17.0)
O2 Saturation: 99 %
Potassium: 4 mmol/L (ref 3.5–5.1)
Sodium: 144 mmol/L (ref 135–145)
TCO2: 22 mmol/L (ref 22–32)
pCO2 arterial: 37 mmHg (ref 32.0–48.0)
pH, Arterial: 7.354 (ref 7.350–7.450)
pO2, Arterial: 126 mmHg — ABNORMAL HIGH (ref 83.0–108.0)

## 2019-11-29 LAB — POCT I-STAT EG7
Acid-base deficit: 2 mmol/L (ref 0.0–2.0)
Acid-base deficit: 3 mmol/L — ABNORMAL HIGH (ref 0.0–2.0)
Bicarbonate: 23 mmol/L (ref 20.0–28.0)
Bicarbonate: 23.8 mmol/L (ref 20.0–28.0)
Calcium, Ion: 1.28 mmol/L (ref 1.15–1.40)
Calcium, Ion: 1.3 mmol/L (ref 1.15–1.40)
HCT: 40 % (ref 39.0–52.0)
HCT: 40 % (ref 39.0–52.0)
Hemoglobin: 13.6 g/dL (ref 13.0–17.0)
Hemoglobin: 13.6 g/dL (ref 13.0–17.0)
O2 Saturation: 61 %
O2 Saturation: 65 %
Potassium: 4.1 mmol/L (ref 3.5–5.1)
Potassium: 4.1 mmol/L (ref 3.5–5.1)
Sodium: 143 mmol/L (ref 135–145)
Sodium: 144 mmol/L (ref 135–145)
TCO2: 24 mmol/L (ref 22–32)
TCO2: 25 mmol/L (ref 22–32)
pCO2, Ven: 42.2 mmHg — ABNORMAL LOW (ref 44.0–60.0)
pCO2, Ven: 44 mmHg (ref 44.0–60.0)
pH, Ven: 7.342 (ref 7.250–7.430)
pH, Ven: 7.344 (ref 7.250–7.430)
pO2, Ven: 34 mmHg (ref 32.0–45.0)
pO2, Ven: 36 mmHg (ref 32.0–45.0)

## 2019-11-29 LAB — PROTIME-INR
INR: 1.3 — ABNORMAL HIGH (ref 0.8–1.2)
Prothrombin Time: 15.4 seconds — ABNORMAL HIGH (ref 11.4–15.2)

## 2019-11-29 SURGERY — RIGHT/LEFT HEART CATH AND CORONARY/GRAFT ANGIOGRAPHY
Anesthesia: LOCAL

## 2019-11-29 MED ORDER — ONDANSETRON HCL 4 MG/2ML IJ SOLN: 4.0000 mg | Freq: Four times a day (QID) | INTRAMUSCULAR | Status: DC | PRN

## 2019-11-29 MED ORDER — MIDAZOLAM HCL 2 MG/2ML IJ SOLN
INTRAMUSCULAR | Status: AC
  Filled 2019-11-29: qty 2

## 2019-11-29 MED ORDER — SODIUM CHLORIDE 0.9% FLUSH: 3.0000 mL | INTRAVENOUS | Status: DC | PRN

## 2019-11-29 MED ORDER — SODIUM CHLORIDE 0.9 % IV SOLN: INTRAVENOUS | Status: AC

## 2019-11-29 MED ORDER — SODIUM CHLORIDE 0.9% FLUSH: 3.0000 mL | Freq: Two times a day (BID) | INTRAVENOUS | Status: DC

## 2019-11-29 MED ORDER — IOHEXOL 350 MG/ML SOLN
INTRAVENOUS | Status: AC
  Filled 2019-11-29: qty 1

## 2019-11-29 MED ORDER — LIDOCAINE HCL (PF) 1 % IJ SOLN
INTRAMUSCULAR | Status: DC | PRN
  Administered 2019-11-29: 2 mL via INTRADERMAL
  Administered 2019-11-29: 10 mL via INTRADERMAL

## 2019-11-29 MED ORDER — HEPARIN (PORCINE) IN NACL 1000-0.9 UT/500ML-% IV SOLN
INTRAVENOUS | Status: AC
  Filled 2019-11-29: qty 500

## 2019-11-29 MED ORDER — SODIUM CHLORIDE 0.9 % IV SOLN: 250.0000 mL | INTRAVENOUS | Status: DC | PRN

## 2019-11-29 MED ORDER — HYDRALAZINE HCL 20 MG/ML IJ SOLN: 10.0000 mg | INTRAMUSCULAR | Status: DC | PRN

## 2019-11-29 MED ORDER — ASPIRIN 81 MG PO CHEW: 81.0000 mg | CHEWABLE_TABLET | ORAL | Status: DC

## 2019-11-29 MED ORDER — HEPARIN (PORCINE) IN NACL 1000-0.9 UT/500ML-% IV SOLN
INTRAVENOUS | Status: DC | PRN
  Administered 2019-11-29 (×3): 500 mL

## 2019-11-29 MED ORDER — IOHEXOL 350 MG/ML SOLN
INTRAVENOUS | Status: DC | PRN
  Administered 2019-11-29: 150 mL

## 2019-11-29 MED ORDER — VERAPAMIL HCL 2.5 MG/ML IV SOLN
INTRAVENOUS | Status: AC
  Filled 2019-11-29: qty 2

## 2019-11-29 MED ORDER — HEPARIN (PORCINE) IN NACL 1000-0.9 UT/500ML-% IV SOLN
INTRAVENOUS | Status: AC
  Filled 2019-11-29: qty 1000

## 2019-11-29 MED ORDER — LABETALOL HCL 5 MG/ML IV SOLN: 10.0000 mg | INTRAVENOUS | Status: DC | PRN

## 2019-11-29 MED ORDER — ACETAMINOPHEN 325 MG PO TABS: 650.0000 mg | ORAL_TABLET | ORAL | Status: DC | PRN

## 2019-11-29 MED ORDER — MIDAZOLAM HCL 2 MG/2ML IJ SOLN
INTRAMUSCULAR | Status: DC | PRN
  Administered 2019-11-29: 1 mg via INTRAVENOUS

## 2019-11-29 MED ORDER — VERAPAMIL HCL 2.5 MG/ML IV SOLN
INTRAVENOUS | Status: DC | PRN
  Administered 2019-11-29: 10 mL via INTRA_ARTERIAL

## 2019-11-29 MED ORDER — ASPIRIN 81 MG PO CHEW: 81.0000 mg | CHEWABLE_TABLET | Freq: Every day | ORAL | Status: DC

## 2019-11-29 MED ORDER — SODIUM CHLORIDE 0.9 % IV SOLN: INTRAVENOUS | Status: DC

## 2019-11-29 MED ORDER — LIDOCAINE HCL (PF) 1 % IJ SOLN
INTRAMUSCULAR | Status: AC
  Filled 2019-11-29: qty 30

## 2019-11-29 SURGICAL SUPPLY — 24 items
CATH INFINITI 5 FR IM (CATHETERS) ×1 IMPLANT
CATH INFINITI 5 FR RCB (CATHETERS) ×1 IMPLANT
CATH INFINITI 5FR AL1 (CATHETERS) ×1 IMPLANT
CATH INFINITI 5FR MULTPACK ANG (CATHETERS) ×1 IMPLANT
CATH INFINITI 6F RCB (CATHETERS) IMPLANT
CATH INFINITI JR4 5F (CATHETERS) ×1 IMPLANT
CATH SWAN GANZ 7F STRAIGHT (CATHETERS) ×1 IMPLANT
DEVICE RAD COMP TR BAND LRG (VASCULAR PRODUCTS) ×1 IMPLANT
GLIDESHEATH SLEND SS 6F .021 (SHEATH) ×1 IMPLANT
GUIDEWIRE ANGLED .035X260CM (WIRE) ×1 IMPLANT
GUIDEWIRE INQWIRE 1.5J.035X260 (WIRE) IMPLANT
INQWIRE 1.5J .035X260CM (WIRE) ×2
KIT HEART LEFT (KITS) ×2 IMPLANT
PACK CARDIAC CATHETERIZATION (CUSTOM PROCEDURE TRAY) ×2 IMPLANT
SHEATH PINNACLE 5F 10CM (SHEATH) ×1 IMPLANT
SHEATH PINNACLE 7F 10CM (SHEATH) ×1 IMPLANT
TRANSDUCER W/STOPCOCK (MISCELLANEOUS) ×3 IMPLANT
TUBING ART PRESS 72  MALE/FEM (TUBING) ×2
TUBING ART PRESS 72 MALE/FEM (TUBING) IMPLANT
TUBING CIL FLEX 10 FLL-RA (TUBING) ×2 IMPLANT
WIRE EMERALD 3MM-J .025X260CM (WIRE) ×1 IMPLANT
WIRE EMERALD 3MM-J .035X150CM (WIRE) ×1 IMPLANT
WIRE EMERALD 3MM-J .035X260CM (WIRE) ×1 IMPLANT
WIRE EMERALD ST .035X260CM (WIRE) ×1 IMPLANT

## 2019-11-29 NOTE — Progress Notes (Addendum)
Site area: Right groin Right Femoral arterial sheath removed _0 :07 Right Femoral vein Sheath removed _1 :14 Site Prior to removal: Level 0 Manual hold for 30 minutes by Judge Stall, RCIS, RT-R Patient status during pull: Alert, oriented x 4 Post pull site: Level 0 Post pull instructions given: Yes Post pull pulses present: Right DP pulses present. Dressing applied: Gauze and Tegaderm applied over sites. Bedrest Begins @ 14:25 Comments:

## 2019-11-29 NOTE — Progress Notes (Signed)
Per Dr Claiborne Billings, pt to take 1/2 coumadin dose tonight and start regular dose tomorrow. Informed pt and wrote in on discharge packet.

## 2019-11-29 NOTE — Interval H&P Note (Signed)
Cath Lab Visit (complete for each Cath Lab visit)  Clinical Evaluation Leading to the Procedure:   ACS: No.  Non-ACS:    Anginal Classification: CCS III  Anti-ischemic medical therapy: Minimal Therapy (1 class of medications)  Non-Invasive Test Results: No non-invasive testing performed  Prior CABG: Previous CABG      History and Physical Interval Note:  11/29/2019 11:19 AM  Troy Miller  has presented today for surgery, with the diagnosis of aortic stenosis.  The various methods of treatment have been discussed with the patient and family. After consideration of risks, benefits and other options for treatment, the patient has consented to  Procedure(s): RIGHT/LEFT HEART CATH AND CORONARY/GRAFT ANGIOGRAPHY (N/A) as a surgical intervention.  The patient's history has been reviewed, patient examined, no change in status, stable for surgery.  I have reviewed the patient's chart and labs.  Questions were answered to the patient's satisfaction.     Shelva Majestic

## 2019-11-29 NOTE — Progress Notes (Signed)
Up and walked and tolerated well right groin and right wrist without bleeding or hematoma

## 2019-11-29 NOTE — Discharge Instructions (Signed)
Drink plenty of fluids for 48 hours and keep wrist elevated at heart level for 24 hours  Radial Site Care   This sheet gives you information about how to care for yourself after your procedure. Your health care provider may also give you more specific instructions. If you have problems or questions, contact your health care provider. What can I expect after the procedure? After the procedure, it is common to have:  Bruising and tenderness at the catheter insertion area. Follow these instructions at home: Medicines  Take over-the-counter and prescription medicines only as told by your health care provider. Insertion site care 1. Follow instructions from your health care provider about how to take care of your insertion site. Make sure you: ? Wash your hands with soap and water before you change your bandage (dressing). If soap and water are not available, use hand sanitizer. ? Remove your dressing as told by your health care provider. In 24 hours 2. Check your insertion site every day for signs of infection. Check for: ? Redness, swelling, or pain. ? Fluid or blood. ? Pus or a bad smell. ? Warmth. 3. Do not take baths, swim, or use a hot tub until your health care provider approves. 4. You may shower 24-48 hours after the procedure, or as directed by your health care provider. ? Remove the dressing and gently wash the site with plain soap and water. ? Pat the area dry with a clean towel. ? Do not rub the site. That could cause bleeding. 5. Do not apply powder or lotion to the site. Activity   1. For 24 hours after the procedure, or as directed by your health care provider: ? Do not flex or bend the affected arm. ? Do not push or pull heavy objects with the affected arm. ? Do not drive yourself home from the hospital or clinic. You may drive 24 hours after the procedure unless your health care provider tells you not to. ? Do not operate machinery or power tools. 2. Do not lift  anything that is heavier than 10 lb (4.5 kg), or the limit that you are told, until your health care provider says that it is safe.  For 4 days 3. Ask your health care provider when it is okay to: ? Return to work or school. ? Resume usual physical activities or sports. ? Resume sexual activity. General instructions  If the catheter site starts to bleed, raise your arm and put firm pressure on the site. If the bleeding does not stop, get help right away. This is a medical emergency.  If you went home on the same day as your procedure, a responsible adult should be with you for the first 24 hours after you arrive home.  Keep all follow-up visits as told by your health care provider. This is important. Contact a health care provider if:  You have a fever.  You have redness, swelling, or yellow drainage around your insertion site. Get help right away if:  You have unusual pain at the radial site.  The catheter insertion area swells very fast.  The insertion area is bleeding, and the bleeding does not stop when you hold steady pressure on the area.  Your arm or hand becomes pale, cool, tingly, or numb. These symptoms may represent a serious problem that is an emergency. Do not wait to see if the symptoms will go away. Get medical help right away. Call your local emergency services (911 in the [place] Do  not drive yourself to the hospital. Summary  After the procedure, it is common to have bruising and tenderness at the site.  Follow instructions from your health care provider about how to take care of your radial site wound. Check the wound every day for signs of infection.  Do not lift anything that is heavier than 10 lb (4.5 kg), or the limit that you are told, until your health care provider says that it is safe. This information is not intended to replace advice given to you by your health care provider. Make sure you discuss any questions you have with your health care  provider. Document Revised: 07/30/2017 Document Reviewed: 07/30/2017 Elsevier Patient Education  2020 [place].  Femoral Site Care This sheet gives you information about how to care for yourself after your procedure. Your health care provider may also give you more specific instructions. If you have problems or questions, contact your health care provider. What can I expect after the procedure?  After the procedure, it is common to have:  Bruising that usually fades within 1-2 weeks.  Tenderness at the site. Follow these instructions at home: Wound care 6. Follow instructions from your health care provider about how to take care of your insertion site. Make sure you: ? Wash your hands with soap and water before you change your bandage (dressing). If soap and water are not available, use hand sanitizer. ? Remove your dressing as told by your health care provider. In 24 hours 7. Do not take baths, swim, or use a hot tub until your health care provider approves. 8. You may shower 24-48 hours after the procedure or as told by your health care provider. ? Gently wash the site with plain soap and water. ? Pat the area dry with a clean towel. ? Do not rub the site. This may cause bleeding. 9. Do not apply powder or lotion to the site. Keep the site clean and dry. 10. Check your femoral site every day for signs of infection. Check for: ? Redness, swelling, or pain. ? Fluid or blood. ? Warmth. ? Pus or a bad smell. Activity 4. For the first 2-3 days after your procedure, or as long as directed: ? Avoid climbing stairs as much as possible. ? Do not squat. 5. Do not lift anything that is heavier than 10 lb (4.5 kg), or the limit that you are told, until your health care provider says that it is safe. For 5 days 6. Rest as directed. ? Avoid sitting for a long time without moving. Get up to take short walks every 1-2 hours. 7. Do not drive for 24 hours if you were given a medicine to help  you relax (sedative). General instructions  Take over-the-counter and prescription medicines only as told by your health care provider.  Keep all follow-up visits as told by your health care provider. This is important. Contact a health care provider if you have:  A fever or chills.  You have redness, swelling, or pain around your insertion site. Get help right away if:  The catheter insertion area swells very fast.  You pass out.  You suddenly start to sweat or your skin gets clammy.  The catheter insertion area is bleeding, and the bleeding does not stop when you hold steady pressure on the area.  The area near or just beyond the catheter insertion site becomes pale, cool, tingly, or numb. These symptoms may represent a serious problem that is an emergency. Do not  wait to see if the symptoms will go away. Get medical help right away. Call your local emergency services (911 in the [place] Do not drive yourself to the hospital. Summary  After the procedure, it is common to have bruising that usually fades within 1-2 weeks.  Check your femoral site every day for signs of infection.  Do not lift anything that is heavier than 10 lb (4.5 kg), or the limit that you are told, until your health care provider says that it is safe. This information is not intended to replace advice given to you by your health care provider. Make sure you discuss any questions you have with your health care provider. Document Revised: 07/07/2017 Document Reviewed: 07/07/2017 Elsevier Patient Education  2020 [employer].

## 2019-12-10 ENCOUNTER — Ambulatory Visit (INDEPENDENT_AMBULATORY_CARE_PROVIDER_SITE_OTHER): Payer: Medicare Other | Admitting: Medical

## 2019-12-10 ENCOUNTER — Other Ambulatory Visit: Payer: Self-pay

## 2019-12-10 ENCOUNTER — Encounter: Payer: Self-pay | Admitting: Medical

## 2019-12-10 ENCOUNTER — Ambulatory Visit (INDEPENDENT_AMBULATORY_CARE_PROVIDER_SITE_OTHER): Payer: Medicare Other | Admitting: Pharmacist

## 2019-12-10 VITALS — BP 82/52 | HR 107 | Ht 70.0 in | Wt 169.0 lb

## 2019-12-10 DIAGNOSIS — I480 Paroxysmal atrial fibrillation: Secondary | ICD-10-CM

## 2019-12-10 DIAGNOSIS — I739 Peripheral vascular disease, unspecified: Secondary | ICD-10-CM

## 2019-12-10 DIAGNOSIS — I350 Nonrheumatic aortic (valve) stenosis: Secondary | ICD-10-CM | POA: Diagnosis not present

## 2019-12-10 DIAGNOSIS — Z951 Presence of aortocoronary bypass graft: Secondary | ICD-10-CM | POA: Diagnosis not present

## 2019-12-10 DIAGNOSIS — I4892 Unspecified atrial flutter: Secondary | ICD-10-CM

## 2019-12-10 DIAGNOSIS — I2510 Atherosclerotic heart disease of native coronary artery without angina pectoris: Secondary | ICD-10-CM

## 2019-12-10 DIAGNOSIS — I255 Ischemic cardiomyopathy: Secondary | ICD-10-CM

## 2019-12-10 DIAGNOSIS — E785 Hyperlipidemia, unspecified: Secondary | ICD-10-CM

## 2019-12-10 LAB — POCT INR: INR: 1.5 — AB (ref 2.0–3.0)

## 2019-12-10 NOTE — Patient Instructions (Addendum)
Medication Instructions:   STOP Valsartan   *If you need a refill on your cardiac medications before your next appointment, please call your pharmacy*  Lab Work: Your physician recommends that you have lab work :  Fasting Lipid Panel-DO NOT EAT OR DRINK PAST MIDNIGHT.-OKAY TO HAVE WATER  If you have labs (blood work) drawn today and your tests are completely normal, you will receive your results only by: Marland Kitchen MyChart Message (if you have MyChart) OR . A paper copy in the mail If you have any lab test that is abnormal or we need to change your treatment, we will call you to review the results.  Testing/Procedures:  You have been referred to Cardiac Electrophysiology in 1 week.   Your physician has requested that you have an echocardiogram. Echocardiography is a painless test that uses sound waves to create images of your heart. It provides your doctor with information about the size and shape of your heart and how well your heart's chambers and valves are working. This procedure takes approximately one hour. There are no restrictions for this procedure. This test is performed at [address]   Please schedule for 4 months   Follow-Up: At [facility], you and your health needs are our priority.  As part of our continuing mission to provide you with exceptional heart care, we have created designated Provider Care Teams.  These Care Teams include your primary Cardiologist (physician) and Advanced Practice Providers (APPs -  Physician Assistants and Nurse Practitioners) who all work together to provide you with the care you need, when you need it.  We recommend signing up for the patient portal called "MyChart".  Sign up information is provided on this After Visit Summary.  MyChart is used to connect with patients for Virtual Visits (Telemedicine).  Patients are able to view lab/test results, encounter notes, upcoming appointments, etc.  Non-urgent messages can be sent to your  provider as well.   To learn more about what you can do with MyChart, go to [email or web address].    Your next appointment:   3 month(s)  The format for your next appointment:   In Person  Provider:   Shelva Majestic, MD  Other Instructions   Continue to monitor blood pressure at home. If blood pressure is persistently greater than 130/80 give our office a call.

## 2019-12-10 NOTE — Progress Notes (Signed)
Cardiology Office Note   Date:  12/10/2019   ID:  Troy Miller, DOB [birth year], MRN [id number]  PCP:  [name], PA-C  Cardiologist:  Shelva Majestic, MD EP: None  Chief Complaint  Patient presents with  . Follow-up    s/p [facility] 11/29/19      History of Present Illness: Troy Miller is a 79 y.o. male with PMH of CAD s/p CABG in 1994 with redo 2005, atrial flutter s/p ablation, chronic combined CHF/ischemic cardiomyopathy, severe aortic stenosis, PAD s/p right fem-tib bypass graft with subsequent PCI to R SFA and L popliteal artery, AAA, OSA on CPAP, lung cancer s/p Elayne Snare presents for post-cath follow-up.  He was last evaluated by cardiology at an outpatient visit with Dr. Claiborne Billings 11/24/19, at which time he reported increased SOB. His most recent echo 10/2019 showed EF 20-25% with severe aortic stenosis was again reviewed and decision made to pursue a R/LHC to further evaluate his coronary artery/bypass anatomy and his aortic valve. [facility] 11/29/19 showed severe native multivessel disease with patent LIMA to OM with collateralization to dRCA/PD/PLA, RIMA to mLAD, with 2 occluded vein grafts from both 1994 and 2005 surgery, with likely moderate aortic stenosis. No intervention occurred at that time and he was recommended to repeat an echo.   He presents today for post-cath follow-up. No significant change in his SOB. He has an appointment to see his pulmonologist next week. He has occasional lightheadedness. BP was also recently low at his PCP visit. Thankfully no syncope. He has occasional palpitations which are stable. No complaints of chest pain, LE edema, dizziness, orthopnea, or PND. He has had trouble with LE edema when off HCTZ in the past.    Past Medical History:  Diagnosis Date  . AAA (abdominal aortic aneurysm) ([place])   . Aortic stenosis, mild    07/10/12 EF 50-55% on echo-mitral annular ca+ also  . Atrial fibrillation ([place])    03/19/10 ablation-[name]  .  Atrial flutter ([place])   . CAD (coronary artery disease)   . DVT (deep venous thrombosis) ([place])   . History of stress test    Myoview 8/16:  EF 47%, inferior scar, no ischemia, Intermediate risk (low EF)  . Hyperlipemia   . Hypertension   . Iliac artery aneurysm ([place])   . PVD (peripheral vascular disease) ([place])    remote right fem-tib bypass graft sugery  . 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 ABDOMINAL AORTOGRAM W/LOWER EXTREMITY N/A 02/18/2017   Procedure: ABDOMINAL AORTOGRAM W/LOWER EXTREMITY;  Surgeon: Serafina Mitchell, MD;  Location: [place] [facility];  Service: Cardiovascular;  Laterality: N/A;  . ABDOMINAL AORTOGRAM W/LOWER EXTREMITY N/A 05/05/2018   Procedure: ABDOMINAL AORTOGRAM W/LOWER EXTREMITY;  Surgeon: Serafina Mitchell, MD;  Location: [place] [facility];  Service: Cardiovascular;  Laterality: N/A;  . BACK SURGERY  2003  . CARDIAC ELECTROPHYSIOLOGY STUDY AND ABLATION  03/19/10  . CARDIOVERSION  08/16/08  . CHOLECYSTECTOMY  08/13/02  . CORONARY ARTERY BYPASS GRAFT  1994 & 2005  . FEMORAL-TIBIAL BYPASS GRAFT     Remote  . HERNIA REPAIR  06/03/07  . PERIPHERAL VASCULAR INTERVENTION Left 02/18/2017   Procedure: PERIPHERAL VASCULAR INTERVENTION;  Surgeon: Serafina Mitchell, MD;  Location: [place] [facility];  Service: Cardiovascular;  Laterality: Left;  POPLITEAL  . PERIPHERAL VASCULAR INTERVENTION Bilateral 05/05/2018   Procedure: PERIPHERAL VASCULAR INTERVENTION;  Surgeon: Harold Barban  W, MD;  Location: [place] [facility];  Service: Cardiovascular;  Laterality: Bilateral;  Rt fem/pop graft and Lt popliteal  . RIGHT/LEFT HEART CATH AND CORONARY/GRAFT ANGIOGRAPHY N/A 11/29/2019   Procedure: RIGHT/LEFT HEART CATH AND CORONARY/GRAFT ANGIOGRAPHY;  Surgeon: Troy Sine, MD;  Location: [place] [facility];  Service: Cardiovascular;  Laterality: N/A;  . VASECTOMY  01/28/08     Current Outpatient Medications    Medication Sig Dispense Refill  . acetaminophen (TYLENOL) 500 MG tablet Take 1,000 mg by mouth every 6 (six) hours as needed for moderate pain or headache.     Marland Kitchen aspirin 81 MG tablet Take 81 mg by mouth daily.    . calcium carbonate (OS-CAL) 600 MG TABS Take 600 mg by mouth 2 (two) times daily with a meal.    . Cholecalciferol (VITAMIN D) 2000 units CAPS Take 2,000 Units by mouth daily.    . famotidine (PEPCID) 20 MG tablet TAKE 1 TABLET BY MOUTH EACH NIGHT AT BEDTIME AS DIRECTED (Patient taking differently: Take 20 mg by mouth at bedtime. ) 90 tablet 0  . folic acid (FOLVITE) 1 MG tablet Take 1 mg by mouth every evening.     Marland Kitchen gentamicin cream (GARAMYCIN) 0.1 % Apply 1 application topically daily.     . hydrochlorothiazide (MICROZIDE) 12.5 MG capsule Take 12.5 mg by mouth daily.    . niacin (NIASPAN) 500 MG CR tablet Take 2,000 mg by mouth every evening.     Marland Kitchen oxyCODONE-acetaminophen (PERCOCET) 10-325 MG tablet Take 1 tablet by mouth 3 (three) times daily as needed for pain.     . pantoprazole (PROTONIX) 40 MG tablet TAKE 1 TABLET BY MOUTH ONCE DAILY 30 TO 60 MINUTES BEFORE FIRST MEAL OF THE DAY (Patient taking differently: Take 40 mg by mouth daily. 30 TO 60 MINUTES BEFORE FIRST MEAL OF THE DAY) 90 tablet 0  . Pirfenidone (ESBRIET) 267 MG TABS Take 3 tablets (801 mg total) by mouth 3 (three) times daily. (Patient taking differently: Take 801 mg by mouth 3 (three) times daily with meals. ) 810 tablet 5  . simvastatin (ZOCOR) 20 MG tablet Take 1 tablet (20 mg total) by mouth daily at 6 PM. 90 tablet 2  . warfarin (COUMADIN) 5 MG tablet Take 1 tablet by mouth daily or as directed by PCP (Patient taking differently: Take 5 mg by mouth daily. ) 45 tablet 0   No current facility-administered medications for this visit.    Allergies:   Fentanyl and Fentanyl and related    Social History:  The patient  reports that he quit smoking about 33 years ago. His smoking use included cigarettes. He has a  50.00 pack-year smoking history. He has never used smokeless tobacco. He reports that he does not drink alcohol or use drugs.   Family History:  The patient's family history includes Heart failure in his father; Lung cancer in his mother; Stroke in his sister.    ROS:  Please see the history of present illness.   Otherwise, review of systems are positive for none.   All other systems are reviewed and negative.    PHYSICAL EXAM: VS:  BP (!) 82/52   Pulse (!) 107   Ht _0  (1.778 m)   Wt 169 lb (76.7 kg)   SpO2 93%   BMI 24.25 kg/m  , BMI Body mass index is 24.25 kg/m. GEN: Well nourished, well developed, in no acute distress HEENT: normal Neck: no JVD, carotid bruits, or masses  Cardiac: RRR; no murmurs, rubs, or gallops,no edema  Respiratory:  clear to auscultation bilaterally, normal work of breathing GI: soft, nontender, nondistended, + BS MS: no deformity or atrophy Skin: warm and dry, no rash Neuro:  Strength and sensation are intact Psych: euthymic mood, full affect   EKG:  EKG is ordered today. The ekg ordered today demonstrates atrial flutter with variable AV block, rate 107 bpm, chronic RBBB, no STE/D. Previous EKG with atrial flutter with 4:1 AV block with rate 51 bpm 11/29/19 which was new.   Recent Labs: 11/24/2019: ALT 10; BUN 20; Creatinine, Ser 1.37; Platelets 233; TSH 3.130 11/29/2019: Hemoglobin 13.6; Potassium 4.1; Sodium 143    Lipid Panel    Component Value Date/Time   CHOL 109 04/10/2018 0827   CHOL 118 05/05/2013 0839   TRIG 106 04/10/2018 0827   TRIG 202 (H) 05/05/2013 0839   HDL 45 04/10/2018 0827   HDL 38 (L) 05/05/2013 0839   CHOLHDL 2.4 04/10/2018 0827   CHOLHDL 2.6 05/20/2014 0823   VLDL 27 05/20/2014 0823   LDLCALC 43 04/10/2018 0827   LDLCALC 40 05/05/2013 0839      Wt Readings from Last 3 Encounters:  12/10/19 169 lb (76.7 kg)  11/29/19 174 lb (78.9 kg)  11/24/19 174 lb (78.9 kg)      Other studies Reviewed: Additional  studies/ records that were reviewed today include:   Right/left heart catheterization 11/29/19:  Ramus lesion is 100% stenosed.  Mid Cx lesion is 80% stenosed.  Prox RCA to Mid RCA lesion is 100% stenosed.  Origin lesion is 100% stenosed.  Ost LAD lesion is 70% stenosed.  Prox LAD to Mid LAD lesion is 100% stenosed.   Severe native CAD with 70% ostial LAD stenosis with total occlusion after a proximal diagonal vessel; 80% stenosis in the circumflex vessel and total proximal native RCA occlusion.  Patent LIMA graft supplying a large marginal vessel of the circumflex with collateralization to the distal RCA PD PLA vessels.  Patent RIMA graft supplying the mid LAD.  Patent old vein graft supplying the diagonal branch of the LAD.  Occluded 2 vein grafts from the 2005 surgery and 2 vein grafts from the 1994 surgery.  Pulmonary arterial hypertension with mean PA pressure at 32 mm.  PVR 5.7 WU.  Mild aortic valve stenosis.  RECOMMENDATION: Will review with colleagues.  The patient's arterial conduits are patent and his distal RCA is well collateralized the vein graft from 1994 supplies the diagonal vessel.  The present catheterization does not suggest severe aortic stenosis will plan to repeat echo Doppler study.  The patient's progressive shortness of breath is multifactorial contributed by ischemia, idiopathic pulmonary fibrosis, and contributed by his aortic valve disease.  Echocardiogram 10/2019: 1. Left ventricular ejection fraction, by estimation, is 20 to 25%. The  left ventricle has severely decreased function. The left ventricle  demonstrates global hypokinesis. The left ventricular internal cavity size  was severely dilated. There is mild  left ventricular hypertrophy. Left ventricular diastolic parameters are  indeterminate.  2. Right ventricular systolic function is severely reduced. The right  ventricular size is severely enlarged. There is mildly elevated  pulmonary  artery systolic pressure.  3. Right atrial size was mild to moderately dilated.  4. The mitral valve is abnormal. Mild mitral valve regurgitation.  5. AV is thickened, calcified with restricted motion Peak and mean  gradients through the vale are 26 and 12 mm Hg respectively. LVOT / AV VTI  ratio is 0.22  consistent with severe AS. Marland Kitchen The aortic valve is abnormal.  Aortic valve regurgitation is not  visualized.  6. The inferior vena cava is normal in size with <50% respiratory  variability, suggesting right atrial pressure of 8 mmHg.  7. Compared to echo from October 2020, LVEF and RVEF are depressed.  ASSESSMENT AND PLAN:  1. Hypotension: BP 82/52 today. He reports occasional lightheadedness but no syncope - Will stop valsartan at this time - Patient asked to monitor BP at home and notify the office if persistently >130/80.   2. CAD s/p CABG 1994 with redo 2005: recent cath with 2 downed grafts with patent LIMA/RIMA with good collateralization. No intervention occurred - Continue aspirin and statin  3. Aortic stenosis: felt to be in the moderate range on recent [facility] - Continue to monitor with surveillance echo's - will plan for a repeat study 04/2020  4. Chronic combined CHF: EF 20-25% on echo 10/2019. Hypotension limits GDMT. Thankfully he appears euvolemic on exam - Continue HCTZ - Continue low sodium diet.  5. Paroxysmal atrial fibrillation/flutter: EKG at the time of cath showed atrial flutter with predominately 4:1 AV block with HR in the 50s. Today he is still in atrial flutter with variable AV block - Unable to add AV nodal blocking agents due to hypotension - Continue coumadin for stroke ppx - managed by PCP. Checked in office today with INR 1.5 - advised to take 7.61m today and get INR checked again next week for close monitoring - Will refer back to EP to assist with management - anticipate cardioversion as he is likely a poor candidate for antiarrhythmic  medications with his CAD history and underlying pulmonary disease.   6. PAD: s/p multiple interventions - Continue aspirin and statin  7. HLD: no recent lipids - He will have FLP checked at PCP's office next week when he goes for INR monitoring - Continue statin  8. DOE: likely multifactorial with ILD, chronic combined CHF, AS, CAD, and now perhaps atrial flutter is contributing.  - Continue with plans to f/u with pulmonology in 1.5 weeks.   Current medicines are reviewed at length with the patient today.  The patient does not have concerns regarding medicines.  The following changes have been made:  As above  Labs/ tests ordered today include:   Orders Placed This Encounter  Procedures  . Lipid panel  . Ambulatory referral to Cardiac Electrophysiology  . EKG 12-Lead  . ECHOCARDIOGRAM COMPLETE     Disposition:   FU with Dr. KClaiborne Billingsin 3 months  Signed, KAbigail Butts PA-C  12/10/2019 5:11 PM

## 2019-12-15 ENCOUNTER — Ambulatory Visit (INDEPENDENT_AMBULATORY_CARE_PROVIDER_SITE_OTHER): Payer: Medicare Other | Admitting: Internal Medicine

## 2019-12-15 ENCOUNTER — Encounter: Payer: Self-pay | Admitting: Internal Medicine

## 2019-12-15 ENCOUNTER — Other Ambulatory Visit: Payer: Self-pay | Admitting: Surgery

## 2019-12-15 ENCOUNTER — Other Ambulatory Visit: Payer: Self-pay

## 2019-12-15 VITALS — BP 96/62 | HR 107 | Ht 70.0 in | Wt 169.4 lb

## 2019-12-15 DIAGNOSIS — I4892 Unspecified atrial flutter: Secondary | ICD-10-CM

## 2019-12-15 DIAGNOSIS — I480 Paroxysmal atrial fibrillation: Secondary | ICD-10-CM

## 2019-12-15 DIAGNOSIS — I724 Aneurysm of artery of lower extremity: Secondary | ICD-10-CM

## 2019-12-15 MED ORDER — DIGOXIN 125 MCG PO TABS: ORAL_TABLET | ORAL | 3 refills | Status: AC

## 2019-12-15 NOTE — Patient Instructions (Addendum)
Medication Instructions:  Your physician has recommended you make the following change in your medication:  1.  Start taking digoxin 0.125 mg-  Take one tablet by mouth daily EXCEPT do NOT take on Sunday.   Digoxin level 4 weeks and BMP  Labwork: You will need lab work in 4 weeks-  Digoxin level and BMP  I will call Dr. Amedeo Kinsman office to see if they can draw the lab work there.  Testing/Procedures: None ordered.  Follow-Up: Your physician wants you to follow-up in: 6 weeks with Dr. Lovena Le.    February 02, 2020 at 2:30 pm at the [facility] office    Any Other Special Instructions Will Be Listed Below (If Applicable).  If you need a refill on your cardiac medications before your next appointment, please call your pharmacy.

## 2019-12-15 NOTE — Progress Notes (Signed)
HPI [name] is referred today after a long absence from our EP clinic. He is a pleasant 79 yo man with a h/o atrial flutter s/p ablation over 10 years ago. He has developed LA flutter with a RVR and is referred for additional evaluation. The patient has not had syncope. He feels ok at rest but quickly gets sob when he tries to walk. His rates do not appear to be controlled. He has not been on any AV nodal blocking drugs. He has pulmonary fibrosis and is on oxygen. He has AS. His EF has fallen from normal to EF of 20%. Allergies  Allergen Reactions   Fentanyl    Fentanyl And Related Other (See Comments)    Hallucinations     Current Outpatient Medications  Medication Sig Dispense Refill   acetaminophen (TYLENOL) 500 MG tablet Take 1,000 mg by mouth every 6 (six) hours as needed for moderate pain or headache.      aspirin 81 MG tablet Take 81 mg by mouth daily.     calcium carbonate (OS-CAL) 600 MG TABS Take 600 mg by mouth 2 (two) times daily with a meal.     Cholecalciferol (VITAMIN D) 2000 units CAPS Take 2,000 Units by mouth daily.     famotidine (PEPCID) 20 MG tablet TAKE 1 TABLET BY MOUTH EACH NIGHT AT BEDTIME AS DIRECTED 90 tablet 0   folic acid (FOLVITE) 1 MG tablet Take 1 mg by mouth every evening.      gentamicin cream (GARAMYCIN) 0.1 % Apply 1 application topically daily.      hydrochlorothiazide (MICROZIDE) 12.5 MG capsule Take 12.5 mg by mouth daily.     niacin (NIASPAN) 500 MG CR tablet Take 2,000 mg by mouth every evening.      oxyCODONE-acetaminophen (PERCOCET) 10-325 MG tablet Take 1 tablet by mouth 3 (three) times daily as needed for pain.      pantoprazole (PROTONIX) 40 MG tablet TAKE 1 TABLET BY MOUTH ONCE DAILY 30 TO 60 MINUTES BEFORE FIRST MEAL OF THE DAY 90 tablet 0   Pirfenidone (ESBRIET) 267 MG TABS Take 3 tablets (801 mg total) by mouth 3 (three) times daily. 810 tablet 5   simvastatin (ZOCOR) 20 MG tablet Take 1 tablet (20 mg total) by  mouth daily at 6 PM. 90 tablet 2   warfarin (COUMADIN) 5 MG tablet Take 1 tablet by mouth daily or as directed by PCP 45 tablet 0   No current facility-administered medications for this visit.     Past Medical History:  Diagnosis Date   AAA (abdominal aortic aneurysm) ([facility])    Aortic stenosis, mild    07/10/12 EF 50-55% on echo-mitral annular ca+ also   Atrial fibrillation ([place])    03/19/10 ablation-Dr. Hiro Vipond-successful   Atrial flutter ([place])    CAD (coronary artery disease)    DVT (deep venous thrombosis) ([place])    History of stress test    Myoview 8/16:  EF 47%, inferior scar, no ischemia, Intermediate risk (low EF)   Hyperlipemia    Hypertension    Iliac artery aneurysm ([facility])    PVD (peripheral vascular disease) ([place])    remote right fem-tib bypass graft sugery   S/P CABG (coronary artery bypass graft) 1994 & 2005    ROS:   All systems reviewed and negative except as noted in the HPI.   Past Surgical History:  Procedure Laterality Date   ABDOMINAL AORTOGRAM W/LOWER EXTREMITY N/A 02/18/2017   Procedure: ABDOMINAL AORTOGRAM  W/LOWER EXTREMITY;  Surgeon: Serafina Mitchell, MD;  Location: [place] [facility];  Service: Cardiovascular;  Laterality: N/A;   ABDOMINAL AORTOGRAM W/LOWER EXTREMITY N/A 05/05/2018   Procedure: ABDOMINAL AORTOGRAM W/LOWER EXTREMITY;  Surgeon: Serafina Mitchell, MD;  Location: [place] [facility];  Service: Cardiovascular;  Laterality: N/A;   BACK SURGERY  2003   CARDIAC ELECTROPHYSIOLOGY STUDY AND ABLATION  03/19/10   CARDIOVERSION  08/16/08   CHOLECYSTECTOMY  08/13/02   CORONARY ARTERY BYPASS GRAFT  1994 & 2005   FEMORAL-TIBIAL BYPASS GRAFT     Remote   HERNIA REPAIR  06/03/07   PERIPHERAL VASCULAR INTERVENTION Left 02/18/2017   Procedure: PERIPHERAL VASCULAR INTERVENTION;  Surgeon: Serafina Mitchell, MD;  Location: [place] [facility];  Service: Cardiovascular;  Laterality: Left;  POPLITEAL   PERIPHERAL VASCULAR INTERVENTION Bilateral  05/05/2018   Procedure: PERIPHERAL VASCULAR INTERVENTION;  Surgeon: Serafina Mitchell, MD;  Location: [place] [facility];  Service: Cardiovascular;  Laterality: Bilateral;  Rt fem/pop graft and Lt popliteal   RIGHT/LEFT HEART CATH AND CORONARY/GRAFT ANGIOGRAPHY N/A 11/29/2019   Procedure: RIGHT/LEFT HEART CATH AND CORONARY/GRAFT ANGIOGRAPHY;  Surgeon: Troy Sine, MD;  Location: [place] [facility];  Service: Cardiovascular;  Laterality: N/A;   VASECTOMY  01/28/08     Family History  Problem Relation Age of Onset   Lung cancer Mother        smoked   Heart failure Father    Stroke Sister      Social History   Socioeconomic History   Marital status: Single    Spouse name: Not on file   Number of children: Not on file   Years of education: Not on file   Highest education level: Not on file  Occupational History   Not on file  Tobacco Use   Smoking status: Former Smoker    Packs/day: 2.00    Years: 25.00    Pack years: 50.00    Types: Cigarettes    Quit date: 04/26/1986    Years since quitting: 33.6   Smokeless tobacco: Never Used  Substance and Sexual Activity   Alcohol use: No    Alcohol/week: 0.0 standard drinks   Drug use: No   Sexual activity: Not on file  Other Topics Concern   Not on file  Social History Narrative   Not on file   Social Determinants of Health   Financial Resource Strain:    Difficulty of Paying Living Expenses:   Food Insecurity:    Worried About Charity fundraiser in the Last Year:    Arboriculturist in the Last Year:   Transportation Needs:    Film/video editor (Medical):    Lack of Transportation (Non-Medical):   Physical Activity:    Days of Exercise per Week:    Minutes of Exercise per Session:   Stress:    Feeling of Stress :   Social Connections:    Frequency of Communication with Friends and Family:    Frequency of Social Gatherings with Friends and Family:    Attends Religious Services:     Active Member of Clubs or Organizations:    Attends Archivist Meetings:    Marital Status:   Intimate Partner Violence:    Fear of Current or Ex-Partner:    Emotionally Abused:    Physically Abused:    Sexually Abused:      BP 96/62    Pulse (!) 107    Ht 5'  10" (1.778 m)    Wt 169 lb 6.4 oz (76.8 kg)    SpO2 90%    BMI 24.31 kg/m   Physical Exam:  Well appearing NAD HEENT: Unremarkable Neck:  No JVD, no thyromegally Lymphatics:  No adenopathy Back:  No CVA tenderness Lungs:  Clear with no wheezes HEART:  Regular rate rhythm, no murmurs, no rubs, no clicks Abd:  soft, positive bowel sounds, no organomegally, no rebound, no guarding Ext:  2 plus pulses, no edema, no cyanosis, no clubbing Skin:  No rashes no nodules Neuro:  CN II through XII intact, motor grossly intact  EKG - atypical atrial flutter with a RVR  DEVICE  Normal device function.  See PaceArt for details.   Assess/Plan: 1. Atypical atrial flutter - he is not a candidate for rhythm control. We will start low dose digoxin and consider adding a beta blocker.  2. AS - he has critical disease but probably too many comorbidities for TAVR. 3. Chronic systolic heart failure - his symptoms are class 3. He is on reasonable medical therapy. Hopefully his CHF will improve when his rate is better controlled. 4. Pulmonary fibrosis - he has been on pirfenidone. He will continue supplemental oxygen.  Troy Miller.D.

## 2019-12-17 ENCOUNTER — Telehealth: Payer: Self-pay

## 2019-12-17 DIAGNOSIS — I480 Paroxysmal atrial fibrillation: Secondary | ICD-10-CM

## 2019-12-17 NOTE — Telephone Encounter (Signed)
Left message with Dr. Haze Justin' office requesting call back to discuss Pt getting lab work at their office in 4 weeks.  Will need BMP and dig level.  Left this nurse direct extension to call back.

## 2019-12-20 NOTE — Telephone Encounter (Signed)
Spoke with Dr. Amedeo Kinsman office  They will get lab work there-order faxed  Pt notified to go to that office 2nd week of July for labs.

## 2019-12-22 ENCOUNTER — Encounter: Payer: Self-pay | Admitting: Adult Health

## 2019-12-22 ENCOUNTER — Ambulatory Visit (INDEPENDENT_AMBULATORY_CARE_PROVIDER_SITE_OTHER): Payer: Medicare Other | Admitting: Adult Health

## 2019-12-22 ENCOUNTER — Other Ambulatory Visit: Payer: Self-pay

## 2019-12-22 ENCOUNTER — Ambulatory Visit: Payer: Medicare Other | Admitting: Adult Health

## 2019-12-22 DIAGNOSIS — J9611 Chronic respiratory failure with hypoxia: Secondary | ICD-10-CM | POA: Diagnosis not present

## 2019-12-22 DIAGNOSIS — I255 Ischemic cardiomyopathy: Secondary | ICD-10-CM

## 2019-12-22 DIAGNOSIS — J84112 Idiopathic pulmonary fibrosis: Secondary | ICD-10-CM

## 2019-12-22 DIAGNOSIS — I4892 Unspecified atrial flutter: Secondary | ICD-10-CM | POA: Diagnosis not present

## 2019-12-22 DIAGNOSIS — C3430 Malignant neoplasm of lower lobe, unspecified bronchus or lung: Secondary | ICD-10-CM

## 2019-12-22 NOTE — Assessment & Plan Note (Signed)
Continue follow-up and recommendations per cardiology

## 2019-12-22 NOTE — Assessment & Plan Note (Signed)
IPF-patient has significant symptom burden.  Recent high-res CT and March showed evolving radiation fibrosis of the right lower lobe from his lung cancer.  And mildly progressive ILD changes from 2018.  He is on maintenance dose of Esbriet.  Will check spirometry with [facility] on return.  Suspect a lot of his shortness of breath is multifactorial with his underlying IPF and cardiomyopathy/congestive heart failure and coronary artery disease.  Plan  Patient Instructions  IPF (idiopathic pulmonary fibrosis) (HCC) Continue esbriet 3 capsules 3  times daily with meals Continue on Oxygen 3l/m  Activity as tolerated.  High protein diet.   Lung Cancer and Lung nodule -  Follow up with [place] as discussed.    Follow up :  3  months ILD clinic with Dr. Chase Caller   Follow up with Cardiology as planned.

## 2019-12-22 NOTE — Progress Notes (Signed)
_0  ID: Troy Miller, male    DOB: [birth year], 79 y.o.   MRN: [id number]  Chief Complaint  Patient presents with  . 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 Sondra Come)  Medical history significant for coronary artery disease status post CABG, ischemic cardiomyopathy,, atrial fib, AAA Obstructive sleep apnea on CPAP  TEST/EVENTS :  PFTs June 2020 show an FEV1 at 93% ratio 94, FVC 70%, DLCO 33% PFTs are stable since September 2019 when DLCO was at 31% FVC 79%  SARS-CoV-2 negative on December 07, 2018  High-resolution CT chest September 17, 2018 showed evolving radiation fibrosis in the right lower lobe without evidence of local tumor recurrence, no findings highly suspicious for metastatic disease. Mild right hilar adenopathy is stable stable ILD changes mildly progressed since 2018  2D echo October 18, 2019 EF 20 to 25%, left ventricle has severely decreased function.  Left ventricle with global hypokinesis, RV systolic function severely reduced mildly elevated pulmonary artery systolic pressure, aortic valve is not thickened calcified which is restricted motion-consistent with severe AS   12/22/2019 Follow up ; ILD, chronic respiratory failure, lung cancer Patient presents for a 59-monthfollow-up .  Patient has underlying idiopathic pulmonary fibrosis is on Esbriet 3 capsules 3 times daily.  Patient says overall he continues to get very short of breath with minimal activity.  He tries to be active but wears out easily.  Says he has been dealing with tachycardia recently.  And has recently started digoxin.  He is followed by cardiology for cardiomyopathy and A. Fib.  He remains on Coumadin. Patient  underwent a recent cardiac cath that showed 2 downed grafts with patent LIMA/RIMA with good collateralization.  No intervention was recommended. Cardiology notes indicate referral to EP and possible cardioversion. Patient denies any increased cough.  Says his O2 saturations have been okay on oxygen between 2 to 3 L.   Allergies  Allergen Reactions  . Fentanyl   . Fentanyl And Related Other (See Comments)    Hallucinations    Immunization History  Administered Date(s) Administered  . Influenza Split 04/20/2014  . Influenza, High Dose Seasonal PF 05/17/2016, 05/12/2017, 04/08/2019  . Influenza,inj,Quad PF,6+ Mos 03/10/2015  . Influenza-Unspecified 06/02/2012  . Moderna SARS-COVID-2 Vaccination 07/19/2019, 08/14/2019  . Pneumococcal Conjugate-13 02/23/2016    Past Medical History:  Diagnosis Date  . AAA (abdominal aortic aneurysm) ([place]   . Aortic stenosis, mild    07/10/12 EF 50-55% on echo-mitral annular ca+ also  . Atrial fibrillation ([place]    03/19/10 ablation-Dr. TGeorgiana Shore . Atrial flutter ([facility]   . CAD (coronary artery disease)   . DVT (deep venous thrombosis) ([place]   . History of stress test    Myoview 8/16:  EF 47%, inferior scar, no ischemia, Intermediate risk (low EF)  . Hyperlipemia   . Hypertension   . Iliac artery aneurysm ([facility]   . PVD (peripheral vascular disease) ([facility]    remote right fem-tib bypass graft sugery  . 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 Packs/day: 2.00  . Years: 25.00  . Pack years: 50.00  . Types:  Cigarettes  . Quit date: 04/26/1986  . Years since quitting: 33.6  Smokeless Tobacco Never Used   Counseling given: Not Answered   Outpatient Medications Prior to Visit  Medication Sig Dispense Refill  . acetaminophen (TYLENOL) 500 MG tablet Take 1,000 mg by mouth every 6 (six) hours as needed for moderate pain or headache.     Marland Kitchen  aspirin 81 MG tablet Take 81 mg by mouth daily.    . calcium carbonate (OS-CAL) 600 MG TABS Take 600 mg by mouth 2 (two) times daily with a meal.    . Cholecalciferol (VITAMIN D) 2000 units CAPS Take 2,000 Units by mouth daily.    . digoxin (LANOXIN) 0.125 MG tablet Take one tablet by mouth daily EXCEPT Do NOT take on Sunday. 90 tablet 3  . famotidine (PEPCID) 20 MG tablet TAKE 1 TABLET BY MOUTH EACH NIGHT AT BEDTIME AS DIRECTED 90 tablet 0  . folic acid (FOLVITE) 1 MG tablet Take 1 mg by mouth every evening.     Marland Kitchen gentamicin cream (GARAMYCIN) 0.1 % Apply 1 application topically daily.     . hydrochlorothiazide (MICROZIDE) 12.5 MG capsule Take 12.5 mg by mouth daily.    . niacin (NIASPAN) 500 MG CR tablet Take 2,000 mg by mouth every evening.     Marland Kitchen oxyCODONE-acetaminophen (PERCOCET) 10-325 MG tablet Take 1 tablet by mouth 3 (three) times daily as needed for pain.     . pantoprazole (PROTONIX) 40 MG tablet TAKE 1 TABLET BY MOUTH ONCE DAILY 30 TO 60 MINUTES BEFORE FIRST MEAL OF THE DAY 90 tablet 0  . Pirfenidone (ESBRIET) 267 MG TABS Take 3 tablets (801 mg total) by mouth 3 (three) times daily. 810 tablet 5  . simvastatin (ZOCOR) 20 MG tablet Take 1 tablet (20 mg total) by mouth daily at 6 PM. 90 tablet 2  . warfarin (COUMADIN) 5 MG tablet Take 1 tablet by mouth daily or as directed by PCP 45 tablet 0   No facility-administered medications prior to visit.     Review of Systems:   Constitutional:   No  weight loss, night sweats,  Fevers, chills,  +fatigue, or  lassitude.  HEENT:   No headaches,  Difficulty swallowing,  Tooth/dental problems, or  Sore throat,                No sneezing, itching, ear ache, nasal congestion, post nasal drip,   CV:  No chest pain,  Orthopnea, PND, swelling in lower extremities, anasarca, dizziness, palpitations, syncope.   GI  No heartburn, indigestion, abdominal pain, nausea, vomiting, diarrhea, change in bowel habits, loss of appetite, bloody stools.    Resp:    No chest wall deformity  Skin: no rash or lesions.  GU: no dysuria, change in color of urine, no urgency or frequency.  No flank pain, no hematuria   MS:  No joint pain or swelling.  No decreased range of motion.  No back pain.    Physical Exam  BP 110/76 (BP Location: Right Arm, Cuff Size: Normal)   Pulse (!) 104   SpO2 95%   GEN: A/Ox3; pleasant , NAD, thin male on oxygen   HEENT:  [place]/AT,  E, NOSE-clear, THROAT-clear, no lesions, no postnasal drip or exudate noted.   NECK:  Supple w/ fair ROM; no JVD; normal carotid impulses w/o bruits; no thyromegaly or nodules palpated; no lymphadenopathy.    RESP bibasilar crackles  no accessory muscle use, no dullness to percussion  CARD: Irregular ,  2/6 systolic murmur, tr peripheral edema, pulses intact, no cyanosis or clubbing.  GI:   Soft & nt; nml bowel sounds; no organomegaly or masses detected.   Musco: Warm bil, no deformities or joint swelling noted.   Neuro: alert, no focal deficits noted.    Skin: Warm, no lesions or rashes    Lab Results:    BNP  Imaging: CARDIAC CATHETERIZATION  Result Date: 11/29/2019  Ramus lesion is 100% stenosed.  Mid Cx lesion is 80% stenosed.  Prox RCA to Mid RCA lesion is 100% stenosed.  Origin lesion is 100% stenosed.  Ost LAD lesion is 70% stenosed.  Prox LAD to Mid LAD lesion is 100% stenosed.  Severe native CAD with 70% ostial LAD stenosis with total occlusion after a proximal diagonal vessel; 80% stenosis in the circumflex vessel and total proximal native RCA occlusion. Patent LIMA graft supplying a large marginal vessel of the circumflex with collateralization to the distal RCA PD PLA vessels. Patent RIMA graft supplying the mid LAD. Patent old vein graft supplying the diagonal branch of the LAD. Occluded 2 vein grafts from the 2005 surgery and 2 vein grafts from the 1994 surgery. Pulmonary arterial hypertension with mean PA pressure at 32 mm.  PVR 5.7 WU. Mild aortic  valve stenosis. RECOMMENDATION: Will review with colleagues.  The patient's arterial conduits are patent and his distal RCA is well collateralized the vein graft from 1994 supplies the diagonal vessel.  The present catheterization does not suggest severe aortic stenosis will plan to repeat echo Doppler study.  The patient's progressive shortness of breath is multifactorial contributed by ischemia, idiopathic pulmonary fibrosis, and contributed by his aortic valve disease.     PFT Results Latest Ref Rng & Units 12/10/2018 03/31/2018 08/01/2017 12/09/2016 05/03/2016 04/14/2015  FVC-Pre L 2.99 3.35 3.53 3.84 3.70 3.96  FVC-Predicted Pre % 70 79 82 89 85 90  FVC-Post L - - - - 3.92 4.03  FVC-Predicted Post % - - - - 90 92  Pre FEV1/FVC % % 94 85 80 76 75 72  Post FEV1/FCV % % - - - - 73 72  FEV1-Pre L 2.83 2.85 2.83 2.92 2.78 2.86  FEV1-Predicted Pre % 93 94 92 94 89 90  FEV1-Post L - - - - 2.85 2.91  DLCO UNC% % 33 31 32 34 36 40  DLCO COR %Predicted % 38 41 41 41 50 56  TLC L - - - - 5.96 5.72  TLC % Predicted % - - - - 82 78  RV % Predicted % - - - - 77 57    No results found for: NITRICOXIDE      Assessment & Plan:   IPF (idiopathic pulmonary fibrosis) (HCC) IPF-patient has significant symptom burden.  Recent high-res CT and March showed evolving radiation fibrosis of the right lower lobe from his lung cancer.  And mildly progressive ILD changes from 2018.  He is on maintenance dose of Esbriet.  Will check spirometry with DLCO on return.  Suspect a lot of his shortness of breath is multifactorial with his underlying IPF and cardiomyopathy/congestive heart failure and coronary artery disease.  Plan  Patient Instructions  IPF (idiopathic pulmonary fibrosis) (HCC) Continue esbriet 3 capsules 3  times daily with meals Continue on Oxygen 3l/m  Activity as tolerated.  High protein diet.   Lung Cancer and Lung nodule -  Follow up with [facility] as discussed.    Follow up :  3  months ILD  clinic with  Dr. Chase Caller   Follow up with Cardiology as planned.      Atrial flutter Continue follow-up and recommendations per cardiology  Lung cancer right lower lobe  Status post XRT.  Continue with planned serial scans and follow-up with Dr. Sondra Come  Chronic respiratory failure with hypoxia ([place]) Continue on oxygen to keep O2 saturations greater than 88 to 90%     Rexene Edison, NP 12/22/2019

## 2019-12-22 NOTE — Patient Instructions (Addendum)
IPF (idiopathic pulmonary fibrosis) (HCC) Continue esbriet 3 capsules 3  times daily with meals Continue on Oxygen 3l/m  Activity as tolerated.  High protein diet.   Lung Cancer and Lung nodule -  Follow up with [facility] as discussed.    Follow up :  3  months ILD clinic with Dr. Chase Caller   Follow up with Cardiology as planned.

## 2019-12-22 NOTE — Assessment & Plan Note (Signed)
Status post XRT.  Continue with planned serial scans and follow-up with Dr. Sondra Come

## 2019-12-22 NOTE — Assessment & Plan Note (Signed)
Continue on oxygen to keep O2 saturations greater than 88 to 90% 

## 2019-12-24 ENCOUNTER — Other Ambulatory Visit: Payer: Self-pay | Admitting: Internal Medicine

## 2020-01-04 ENCOUNTER — Ambulatory Visit: Payer: Medicare Other | Admitting: Adult Health

## 2020-01-19 ENCOUNTER — Telehealth: Payer: Self-pay | Admitting: Internal Medicine

## 2020-01-19 MED ORDER — FAMOTIDINE 20 MG PO TABS: ORAL_TABLET | ORAL | 0 refills | Status: DC

## 2020-01-19 NOTE — Telephone Encounter (Signed)
Called and spoke with patient he states he needs refill on Famotidine. He has been out for 3 days now and that he called pharmacy and they said they would send something over to the office. Refill sent to pharmacy that patient verified. Nothing further needed at this time.

## 2020-01-28 ENCOUNTER — Ambulatory Visit (HOSPITAL_COMMUNITY)
Admission: RE | Admit: 2020-01-28 | Payer: Medicare Other | Source: Ambulatory Visit | Attending: Cardiovascular Disease | Admitting: Cardiovascular Disease

## 2020-01-28 ENCOUNTER — Ambulatory Visit (HOSPITAL_COMMUNITY): Payer: Medicare Other

## 2020-01-31 ENCOUNTER — Other Ambulatory Visit: Payer: Self-pay | Admitting: Cardiovascular Disease

## 2020-01-31 MED ORDER — SIMVASTATIN 20 MG PO TABS: 20.0000 mg | ORAL_TABLET | Freq: Every day | ORAL | 2 refills | Status: AC

## 2020-01-31 NOTE — Telephone Encounter (Signed)
*  STAT* If patient is at the pharmacy, call can be transferred to refill team.   1. Which medications need to be refilled? (please list name of each medication and dose if known) simvastatin (ZOCOR) 20 MG tablet  2. Which pharmacy/location (including street and city if local pharmacy) is medication to be sent to? [place], [place] - [phone]  3. Do they need a 30 day or 90 day supply? 90 day supply

## 2020-02-02 ENCOUNTER — Ambulatory Visit (INDEPENDENT_AMBULATORY_CARE_PROVIDER_SITE_OTHER): Payer: Medicare Other | Admitting: Internal Medicine

## 2020-02-02 ENCOUNTER — Other Ambulatory Visit: Payer: Self-pay

## 2020-02-02 ENCOUNTER — Encounter: Payer: Self-pay | Admitting: Internal Medicine

## 2020-02-02 VITALS — BP 110/60 | HR 91 | Ht 70.5 in | Wt 164.4 lb

## 2020-02-02 DIAGNOSIS — I4892 Unspecified atrial flutter: Secondary | ICD-10-CM | POA: Diagnosis not present

## 2020-02-02 DIAGNOSIS — I350 Nonrheumatic aortic (valve) stenosis: Secondary | ICD-10-CM

## 2020-02-02 DIAGNOSIS — I255 Ischemic cardiomyopathy: Secondary | ICD-10-CM

## 2020-02-02 DIAGNOSIS — I5032 Chronic diastolic (congestive) heart failure: Secondary | ICD-10-CM | POA: Diagnosis not present

## 2020-02-02 NOTE — Patient Instructions (Addendum)
Medication Instructions:  Your physician recommends that you continue on your current medications as directed. Please refer to the Current Medication list given to you today.  *If you need a refill on your cardiac medications before your next appointment, please call your pharmacy*  Lab Work: None ordered.  If you have labs (blood work) drawn today and your tests are completely normal, you will receive your results only by: Marland Kitchen MyChart Message (if you have MyChart) OR . A paper copy in the mail If you have any lab test that is abnormal or we need to change your treatment, we will call you to review the results.  Testing/Procedures: None ordered.  Follow-Up: At [facility], you and your health needs are our priority.  As part of our continuing mission to provide you with exceptional heart care, we have created designated Provider Care Teams.  These Care Teams include your primary Cardiologist (physician) and Advanced Practice Providers (APPs -  Physician Assistants and Nurse Practitioners) who all work together to provide you with the care you need, when you need it.  We recommend signing up for the patient portal called "MyChart".  Sign up information is provided on this After Visit Summary.  MyChart is used to connect with patients for Virtual Visits (Telemedicine).  Patients are able to view lab/test results, encounter notes, upcoming appointments, etc.  Non-urgent messages can be sent to your provider as well.   To learn more about what you can do with MyChart, go to [email or web address].    Your next appointment:   Your physician wants you to follow-up in: 6 months with Dr. Lovena Le. You will receive a reminder letter in the mail two months in advance. If you don't receive a letter, please call our office to schedule the follow-up appointment.    Other Instructions:

## 2020-02-02 NOTE — Addendum Note (Signed)
Addended by: Rose Phi on: 02/02/2020 05:30 PM   Modules accepted: Orders

## 2020-02-02 NOTE — Progress Notes (Signed)
HPI [name] returns today for followup. He is a pleasant 79 yo man with IPF, AS, chronic systolic heart failure and atypical atrial flutter. I started him on digoxin when I saw him a couple of months ago. His HR has improved. His main complaint is related to his back itching which seems to be due to his pirfendione. His dyspnea is at baseline. Allergies  Allergen Reactions  . Fentanyl   . Fentanyl And Related Other (See Comments)    Hallucinations     Current Outpatient Medications  Medication Sig Dispense Refill  . acetaminophen (TYLENOL) 500 MG tablet Take 1,000 mg by mouth every 6 (six) hours as needed for moderate pain or headache.     Marland Kitchen aspirin 81 MG tablet Take 81 mg by mouth daily.    . calcium carbonate (OS-CAL) 600 MG TABS Take 600 mg by mouth 2 (two) times daily with a meal.    . Cholecalciferol (VITAMIN D) 2000 units CAPS Take 2,000 Units by mouth daily.    . digoxin (LANOXIN) 0.125 MG tablet Take one tablet by mouth daily EXCEPT Do NOT take on Sunday. 90 tablet 3  . famotidine (PEPCID) 20 MG tablet Take 1 tablet by mouth each night at bedtime as directed 90 tablet 0  . folic acid (FOLVITE) 1 MG tablet Take 1 mg by mouth every evening.     Marland Kitchen gentamicin cream (GARAMYCIN) 0.1 % Apply 1 application topically daily.     . hydrochlorothiazide (MICROZIDE) 12.5 MG capsule Take 12.5 mg by mouth daily.    . niacin (NIASPAN) 500 MG CR tablet Take 2,000 mg by mouth every evening.     Marland Kitchen oxyCODONE-acetaminophen (PERCOCET) 10-325 MG tablet Take 1 tablet by mouth 3 (three) times daily as needed for pain.     . pantoprazole (PROTONIX) 40 MG tablet TAKE 1 TABLET BY MOUTH ONCE DAILY 30 TO 60 MINUTES BEFORE FIRST MEAL OF THE DAY 90 tablet 3  . Pirfenidone (ESBRIET) 267 MG TABS Take 3 tablets (801 mg total) by mouth 3 (three) times daily. 810 tablet 5  . simvastatin (ZOCOR) 20 MG tablet Take 1 tablet (20 mg total) by mouth daily at 6 PM. 90 tablet 2  . warfarin (COUMADIN) 5 MG tablet  Take 1 tablet by mouth daily or as directed by PCP 45 tablet 0   No current facility-administered medications for this visit.     Past Medical History:  Diagnosis Date  . AAA (abdominal aortic aneurysm) ([place])   . Aortic stenosis, mild    07/10/12 EF 50-55% on echo-mitral annular ca+ also  . Atrial fibrillation ([place])    03/19/10 ablation-Dr. Georgiana Miller  . Atrial flutter ([place])   . CAD (coronary artery disease)   . DVT (deep venous thrombosis) ([place])   . History of stress test    Myoview 8/16:  EF 47%, inferior scar, no ischemia, Intermediate risk (low EF)  . Hyperlipemia   . Hypertension   . Iliac artery aneurysm ([place])   . PVD (peripheral vascular disease) ([place])    remote right fem-tib bypass graft sugery  . S/P CABG (coronary artery bypass graft) 1994 & 2005    ROS:   All systems reviewed and negative except as noted in the HPI.   Past Surgical History:  Procedure Laterality Date  . ABDOMINAL AORTOGRAM W/LOWER EXTREMITY N/A 02/18/2017   Procedure: ABDOMINAL AORTOGRAM W/LOWER EXTREMITY;  Surgeon: Serafina Mitchell, MD;  Location: [facility];  Service: Cardiovascular;  Laterality: N/A;  . ABDOMINAL AORTOGRAM W/LOWER EXTREMITY N/A 05/05/2018   Procedure: ABDOMINAL AORTOGRAM W/LOWER EXTREMITY;  Surgeon: Serafina Mitchell, MD;  Location: [facility];  Service: Cardiovascular;  Laterality: N/A;  . BACK SURGERY  2003  . CARDIAC ELECTROPHYSIOLOGY STUDY AND ABLATION  03/19/10  . CARDIOVERSION  08/16/08  . CHOLECYSTECTOMY  08/13/02  . CORONARY ARTERY BYPASS GRAFT  1994 & 2005  . FEMORAL-TIBIAL BYPASS GRAFT     Remote  . HERNIA REPAIR  06/03/07  . PERIPHERAL VASCULAR INTERVENTION Left 02/18/2017   Procedure: PERIPHERAL VASCULAR INTERVENTION;  Surgeon: Serafina Mitchell, MD;  Location: [facility];  Service: Cardiovascular;  Laterality: Left;  POPLITEAL  . PERIPHERAL VASCULAR INTERVENTION Bilateral 05/05/2018   Procedure: PERIPHERAL VASCULAR INTERVENTION;  Surgeon:  Serafina Mitchell, MD;  Location: [facility];  Service: Cardiovascular;  Laterality: Bilateral;  Rt fem/pop graft and Lt popliteal  . RIGHT/LEFT HEART CATH AND CORONARY/GRAFT ANGIOGRAPHY N/A 11/29/2019   Procedure: RIGHT/LEFT HEART CATH AND CORONARY/GRAFT ANGIOGRAPHY;  Surgeon: Troy Sine, MD;  Location: [facility];  Service: Cardiovascular;  Laterality: N/A;  . VASECTOMY  01/28/08     Family History  Problem Relation Age of Onset  . Lung cancer Mother        smoked  . Heart failure Father   . Stroke Sister      Social History   Socioeconomic History  . Marital status: Single    Spouse name: Not on file  . Number of children: Not on file  . Years of education: Not on file  . Highest education level: Not on file  Occupational History  . Not on file  Tobacco Use  . Smoking status: Former Smoker    Packs/day: 2.00    Years: 25.00    Pack years: 50.00    Types: Cigarettes    Quit date: 04/26/1986    Years since quitting: 33.7  . Smokeless tobacco: Never Used  Vaping Use  . Vaping Use: Never used  Substance and Sexual Activity  . Alcohol use: No    Alcohol/week: 0.0 standard drinks  . Drug use: No  . Sexual activity: Not on file  Other Topics Concern  . Not on file  Social History Narrative  . Not on file   Social Determinants of Health   Financial Resource Strain:   . Difficulty of Paying Living Expenses:   Food Insecurity:   . Worried About Charity fundraiser in the Last Year:   . Arboriculturist in the Last Year:   Transportation Needs:   . Film/video editor (Medical):   Marland Kitchen Lack of Transportation (Non-Medical):   Physical Activity:   . Days of Exercise per Week:   . Minutes of Exercise per Session:   Stress:   . Feeling of Stress :   Social Connections:   . Frequency of Communication with Friends and Family:   . Frequency of Social Gatherings with Friends and Family:   . Attends Religious Services:   . Active Member of Clubs or  Organizations:   . Attends Archivist Meetings:   Marland Kitchen Marital Status:   Intimate Partner Violence:   . Fear of Current or Ex-Partner:   . Emotionally Abused:   Marland Kitchen Physically Abused:   . Sexually Abused:      BP (!) 110/60   Pulse 91   Ht 5' 10.5" (1.791 m)   Wt 164 lb 6.4 oz (74.6  kg)   SpO2 (!) 82%   BMI 23.26 kg/m   Physical Exam:  Well appearing NAD HEENT: Unremarkable Neck:  No JVD, no thyromegally Lymphatics:  No adenopathy Back:  No CVA tenderness Lungs:  Clear with no wheezes HEART:  IRegular rate rhythm, no murmurs, no rubs, no clicks Abd:  soft, positive bowel sounds, no organomegally, no rebound, no guarding Ext:  2 plus pulses, no edema, no cyanosis, no clubbing Skin:  No rashes no nodules Neuro:  CN II through XII intact, motor grossly intact  EKG - atrial fib   Assess/Plan: 1. Atypical atrial flutter/fib - he will continue his digoxin as his rate is improved. 2. AS - his exam is uncontrolled 3. Chronic systolic heart failure - his symptoms are class 2-3. He will avoid salty food 4. Pulmonary fibrosis - he is on pirfenidone. I suspect that it is causing his pruritus.  Mikle [email or web address].D.

## 2020-02-29 ENCOUNTER — Other Ambulatory Visit: Payer: Self-pay

## 2020-02-29 ENCOUNTER — Other Ambulatory Visit (HOSPITAL_COMMUNITY): Payer: Self-pay | Admitting: Cardiovascular Disease

## 2020-02-29 ENCOUNTER — Ambulatory Visit (HOSPITAL_COMMUNITY)
Admission: RE | Admit: 2020-02-29 | Discharge: 2020-02-29 | Disposition: A | Discharge status: Home / Self Care | Payer: Medicare Other | Source: Ambulatory Visit | Attending: Cardiovascular Disease | Admitting: Cardiovascular Disease

## 2020-02-29 ENCOUNTER — Ambulatory Visit (HOSPITAL_BASED_OUTPATIENT_CLINIC_OR_DEPARTMENT_OTHER)
Admission: RE | Admit: 2020-02-29 | Discharge: 2020-02-29 | Disposition: A | Discharge status: Home / Self Care | Payer: Medicare Other | Source: Ambulatory Visit | Attending: Cardiovascular Disease | Admitting: Cardiovascular Disease

## 2020-02-29 DIAGNOSIS — Z95820 Peripheral vascular angioplasty status with implants and grafts: Secondary | ICD-10-CM | POA: Insufficient documentation

## 2020-02-29 DIAGNOSIS — I714 Abdominal aortic aneurysm, without rupture, unspecified: Secondary | ICD-10-CM

## 2020-02-29 DIAGNOSIS — I739 Peripheral vascular disease, unspecified: Secondary | ICD-10-CM

## 2020-03-16 ENCOUNTER — Encounter: Payer: Self-pay | Admitting: Internal Medicine

## 2020-03-16 ENCOUNTER — Ambulatory Visit (INDEPENDENT_AMBULATORY_CARE_PROVIDER_SITE_OTHER): Payer: Medicare Other | Admitting: Internal Medicine

## 2020-03-16 ENCOUNTER — Other Ambulatory Visit: Payer: Self-pay

## 2020-03-16 VITALS — BP 116/72 | HR 72 | Temp 98.5°F | Ht 70.0 in | Wt 159.2 lb

## 2020-03-16 DIAGNOSIS — I255 Ischemic cardiomyopathy: Secondary | ICD-10-CM | POA: Diagnosis not present

## 2020-03-16 DIAGNOSIS — Z5181 Encounter for therapeutic drug level monitoring: Secondary | ICD-10-CM

## 2020-03-16 DIAGNOSIS — R1310 Dysphagia, unspecified: Secondary | ICD-10-CM | POA: Diagnosis not present

## 2020-03-16 DIAGNOSIS — J84112 Idiopathic pulmonary fibrosis: Secondary | ICD-10-CM | POA: Diagnosis not present

## 2020-03-16 DIAGNOSIS — R634 Abnormal weight loss: Secondary | ICD-10-CM | POA: Diagnosis not present

## 2020-03-16 DIAGNOSIS — L298 Other pruritus: Secondary | ICD-10-CM | POA: Diagnosis not present

## 2020-03-16 DIAGNOSIS — T50905A Adverse effect of unspecified drugs, medicaments and biological substances, initial encounter: Secondary | ICD-10-CM

## 2020-03-16 NOTE — Patient Instructions (Addendum)
IPF (idiopathic pulmonary fibrosis) (HCC) Therapeutic drug monitoring    -Clinically IPF might be worse over time  Plan -Check liver function test today, CBC and chemistry panel today -Continue oxygen -Do high-resolution CT scan of the chest supine and prone in the next few to several weeks -Stop pirfenidone due to weight loss and itching -Recommend high-dose flu shot when available -Recommend Covid booster when available   Itching due to drug -might be due to pirfenidone  Plan -Stop pirfenidone for 3 weeks -Then start pirfenidone at 1 pill 3 times daily for 1 week followed by 2 pills 3 times daily for 1 week and then regular scheduled dose of 3 pills 3 times daily  Dysphagia, unspecified type Weight loss, abnormal  -Weight loss could be because of pirfenidone but also could be because of swallowing difficulties that you are having for the last few months  Plan -Talk to primary care physician and get a GI referral -We will see if the CT scan of the chest is giving [place] any clues for swallowing difficulties and weight loss issues   Follow-up -Next f 4-7 weeks with myself or nurse practitioner for the 30-minute visit on any day to review results and outcomes

## 2020-03-16 NOTE — Progress Notes (Signed)
OV 05/12/2017  Chief Complaint  Patient presents with  . Follow-up    Pt states that he is doing good. C/o SOB with exertion, occ. cough with clear to white mucus. Denies any CP.   Follow-up idiopathic pulmonary fibrosis started on Pirfenidone (Esbriet) and June 2018.  Follow left lower lobe lung nodule 8 mm in February 2018   He is here for routine follow-up. Last visit he did see my colleague and noticed some increased shortness of breath but currently this is settled. He is on full dose Pirfenidone (Esbriet) and is tolerating it well without any problems. He is interested in enrolling into the 1 pill format. He denies any worsening shortness of breath or cough that he does have shortness of breath with exertion relieved by rest but this is stable. His most recent liver function test was in September 2018 and normal. His creatinine was slightly elevated at 1.2 mg percent at that time. He is interested in research protocols in the future. He lives one hour away and it would be easier for him to have liver function tests monitoring locally he's not had a follow-up CT scan of the chest for his left lower lobe nodule noted in August 2080 needed have a CT angiogram that showed his lung nodule is stable compared to February 2018.    OV 08/01/2017  Chief Complaint  Patient presents with  . Follow-up    HRCT done 11/13, PET scan done 12/12, and PFT done today. Pt is currently on Esbriet. Pt states he has been doing about the same as last visit.  Occ. cough, SOB with exertion. Denies any CP.     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 Followup RLL lung nodule  In terms of his IPF: He is doing well clinically.  He says he is stable.  However when we walked him he desaturated on the second lap.  In fact pulmonary function test shows a decline.  But he is not feeling it.  He does have portable oxygen with him at home and he does not  use it.  Liver function tests in January 2019 on the seventh was normal.  He did this outside with his primary care physician and he brought the results with me for review.  At this point in time he finished 6 months of liver function test.  After this he will have liver function test every 3 months  Lung nodule: His CT scan recently showed right lower lobe lung nodule.  He had a follow-up PET scan and this shows the lung nodule to be PET hot.  I visualized the scan with Dr. Baltazar Apo and it is too far for bronchoscopy means.  The pretest probability for this being early stage non-small cell lung cancer is extremely high over 95% in my view.    Walking desaturation test on 08/01/2017 185 feet x 3 laps on ROOM AIR:  did YES desaturate. Rest pulse ox was 98%, final pulse ox was 85 at% at 2nd lap and stopped and started on 2L [place] an stayed at 96% for remainder. HR response 50/min at rest to 90 /min at peak exertion at 2nd lap when desaturated. Patient Troy Miller  Yes did Desaturate < 88% . Troy Miller yes  Desaturated </= 3% points. Troy Miller yes did get tachyardic    OV 11/28/2017    Chief Complaint  Patient presents with  . Acute Visit  Pt has c/o SOB and cough with yellow mucus. Pt started on pred taper and abx 5/22.Pt states he hs had some indigestion after taking the doxy. Pt states since started on doxy and pred, SOB has improved. Pt is also still coughing but states mucus is turning clear now.   Troy Miller  presents for follow-up of his idiopathic pulmonary fibrosis and therapeutic monitoring.  He is on Esbriet.  As of January 2019 he was having slow decline in lung function.  But he was tolerating the Pirfenidone (Esbriet) just fine.  At that time he had a PET heart right lower lobe nodule and I referred him to oncology.  Review of the notes indicate that he had 3 settings of radiation therapy and he was doing well after that.  He has a follow-up pending with  radiation oncology.  On Nov 26, 2017 he called our office with acute bronchitis exacerbation and we gave him doxycycline and prednisone.  He is midway through it and is beginning to feel a lot better and is close to baseline.  He did have some nausea and stomach irritation with the doxycycline.  Review of the medication list shows that he is on Niaspan for his hyperlipidemia and also Pirfenidone (Esbriet) for his IPF both of which are prone for GI side effects although at baseline he is never had any problems with those.  At this point in time he is overall feeling good.  He uses oxygen at night and periodically on a subjective basis with exertion   OV 12/30/2017  Chief Complaint  Patient presents with  . Follow-up    Pt states he is about the same as he was at last visit. Pt still becomes SOB, has cough with mostly clear mucus, and sometimes has occ. CP/chest tightness.   Troy Miller , 79 y.o. , with dob 09-Feb-1941 and male ,Not Hispanic or Latino from [place] [address] - presents to ILD  clinic for IPF followup on Esbreit  In terms of IPF: He is overall stable. He tells me that he takes his aspirin regularly. Does not much of nausea. He is tolerating it fine. He apply sunscreen. Uses oxygen at night. He uses oxygen only at day time with exertion but on a subjective basis even though he desaturated quickly at the end of second lap in the office is documented below which is similar to before. His last liver function was May 2019 in stable. I personally review that result with a normal CK of 55 and AST are 16 and ALT of 12.  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 Radiation was in spring 2019. He is now having for the last several weeks to a few months new onset right lower lobe chest pain that is musculoskeletal. It happens randomly in the daytime particularly when he moves but he is not associating it with a  body movement is no radiation. The symptoms are mild to moderate in intensity. Is no clear cut aggravating or relieving factors. It is not present at night when he sleeping. This no weight loss or hemoptysis. Last imaging was in January 2019 at the time of diagnosis of cancer. His follow-up with radiation oncology on review of the chartshows its on 01/26/2018.     OV 03/31/2018  Subjective:  Patient ID: Troy Miller, male , DOB: 12-04-1940 , age 70 y.o. , MRN: [id number] , ADDRESS: [address] [place] [address]   03/31/2018 -   Chief Complaint  Patient presents with  . Follow-up    PFT performed today. Pt still becomes SOB when he exerts himself, has occ cough with clear to yellow mucus. Denies any CP.     - Follow-up idiopathic pulmonary fibrosis started on Pirfenidone (Esbriet)  - sinceeMay/June 2018.  - [place] RLL lung cancer - s/p XRT Feb 2019 - CT June 2019 - withj 50m LLL new nodule  HPI Troy Miller 79y.o. -follows up IPF.  He is on Esbriet and is tolerating this well.  Although for the last several months he tells me that he had nonspecific itching that is moderate in severity.  He is wondering if it is because of Pirfenidone (Esbriet) but he is on many medications.  He does have dry skin.  He does not use Vaseline.  He continues to be on Niaspan for hyperlipidemia along with statin.  He has an upcoming cardiology appointment.  In terms of his IPF he feels stable.  His walking desaturation test is stable.  His lung function shows a mild decline but then looking at the flow volume loop it appears he has coughed significantly.  He does use oxygen as needed with exertion but is not fully regular with it.  He does not have any other GI side effects or sound side effects of Pirfenidone (Esbriet).  He does apply sunscreen regularly.  There is no rash anywhere.  New issue: He had a CT scan June 2019 and during post radiation surveillance a 5 mm left lower lobe nodule was  discovered this is new.   OV 10/21/2019 - telephone visit, 2 person identifier used, Risk benefit limitation of tele visit  Subjective:  Patient ID: JVivi Miller male , DOB: 7Mar 27, 1942, age 79y.o. , MRN: [id number], [place] [id number]   - Follow-up idiopathic pulmonary fibrosis started on Pirfenidone ([place]  - sinceeMay/June 2018.  - [place] RLL lung cancer - s/p XRT Feb 2019 - CT June 2019 - withj 565mLLL new nodule  10/21/2019 -  Telephone visit  HPI JaKitt Miller [age].o. -on this telephone visit reports that he slowly getting worse in terms of dyspnea.  He uses oxygen with exertion and at night but not at rest.  Otherwise he is feeling fine.  He tolerates his Esbriet fine.  He had a high-resolution CT chest that shows progression.  I personally last saw him in 2019.  In the interim he saw a nuDesigner, jewellery PFTs also show progression.  OV JUne 2021    12/22/2019 Follow up ; ILD, chronic respiratory failure, lung cancer Patient presents for a 2-17-monthllow-up .  Patient has underlying idiopathic pulmonary fibrosis is on Esbriet 3 capsules 3 times daily.  Patient says overall he continues to get very short of breath with minimal activity.  He tries to be active but wears out easily.  Says he has been dealing with tachycardia recently.  And has recently started digoxin.  He is followed by cardiology for cardiomyopathy and A. Fib.  He remains on Coumadin. Patient underwent a recent cardiac cath that showed 2 downed grafts with patent LIMA/RIMA with good collateralization.  No intervention was recommended. Cardiology notes indicate referral to EP and possible cardioversion. Patient denies any increased cough.  Says his O2 saturations have been okay on oxygen between 2 to 3 L.     OV 03/16/2020  Subjective:  Patient ID: Troy Miller, male , DOB: Sep 26, 1940, age 79 y.o. years. , MRN: [id number],  ADDRESS: [place] [address] PCP   [name], PA-C Providers : Treatment Team:  Attending Provider: Brand Males, MD   Chief Complaint  Patient presents with  . Follow-up    pt states esbriet causes itching     - Follow-up idiopathic pulmonary fibrosis started on Pirfenidone (Esbriet)  - sinceeMay/June 2018.  - [place] RLL lung cancer - s/p XRT Feb 2019 - CT June 2019 - withj 8m LLL new nodule  -Chronic hypoxemic respiratory failure due to the above.  R HPI JTimofey Miller 80y.o. - presents for follow-up.  I meeting his wife for the first time.  I personally not seen him face-to-face in a long time.  He has lost a lot of weight.  His shortness of breath is also worse.  Wife reports dysphagia for solids and the food getting stuck in the mouth in the throat for the last few to several months.  He is having to drink water to clear the solid being stuck.  His last CT scan of the chest was in March 2020.  At home he uses 3 L of oxygen.  Wife is also reporting significant itching that she strongly believes is because of pirfenidone.  She says ever since he started pirfenidone he has had diffuse itching.  Did use hydrocortisone cream on a as needed basis to control this.  He has completed his radiation treatment.  He has had his Covid vaccine and is trying to wait to get a booster.  He is not had his flu shot for the season as yet.      Simple office walk 185 feet x  3 laps goal with forehead probe 11/28/2017  12/30/2017  03/31/2018   O2 used Room air Room air Room air  Number laps completed 1 2 - stopeed at end due to desat 2 - stopped at end of 2 lapst due to desat  Comments about pace Normal pace slow Normal pace  Resting Pulse Ox/HR 93% and 63/min 96%/61/min     97% and 76/min  Final Pulse Ox/HR 84% and 91/min 87% and 97/min 86% and 91/min  Desaturated </= 88% yes yes ues  Desaturated <= 3% points yes yes ues  Got Tachycardic >/= 90/min yes yes yes  Symptoms at end of test x x Mild dyspnea  Miscellaneous  comments desatruiated in 1 lap, corrected with 2L [place] at 1 lap desat after 2 laps - same ass before Similar to before    IMPRESSION: HRCT 1. Evolving radiation fibrosis in the posterior right lower lobe, without evidence of local tumor recurrence. 2. No findings highly suspicious for metastatic disease in the chest. Mild right hilar adenopathy is stable and potentially reactive. Several scattered solid left pulmonary nodules are stable since 2018 and probably benign. No new significant pulmonary nodules. 3. 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 [place] Iss 5, [phone] Mar 08 2017. 4. Mild cardiomegaly. 5. Stable calcified bilateral pleural plaques and smooth pleural thickening without significant pleural effusions, compatible with asbestos related pleural disease.  Aortic Atherosclerosis (ICD10-I70.0) and Emphysema (ICD10-J43.9).   Electronically Signed   By: JIlona SorrelM.D.   On: 09/17/2018 16:21 ROS - per HPI  PFT Results Latest Ref Rng & Units 12/10/2018 03/31/2018 08/01/2017 12/09/2016 05/03/2016 04/14/2015  FVC-Pre L 2.99 3.35 3.53 3.84 3.70 3.96  FVC-Predicted Pre % 70 79 82 89 85 90  FVC-Post L - - - - 3.92 4.03  FVC-Predicted Post % - - - - 90 92  Pre FEV1/FVC % % 94 85 80 76 75 72  Post FEV1/FCV % % - - - - 73 72  FEV1-Pre L 2.83 2.85 2.83 2.92 2.78 2.86  FEV1-Predicted Pre % 93 94 92 94 89 90  FEV1-Post L - - - - 2.85 2.91  DLCO uncorrected ml/min/mmHg 8.42 10.69 10.88 11.70 12.45 13.79  DLCO UNC% % 33 31 32 34 36 40  DLCO corrected ml/min/mmHg - - 11.59 11.02 12.13 -  DLCO COR %Predicted % - - 34 32 36 -  DLVA Predicted % 38 41 41 41 50 56  TLC L - - - - 5.96 5.72  TLC % Predicted % - - - - 82 78  RV %  Predicted % - - - - 77 57     ROS - per HPI  Results for AYDYN, TESTERMAN (MRN [id number]) as of 03/16/2020 16:08  Ref. 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   reports that he quit smoking about 33 years ago. His smoking use included cigarettes. He has a 50.00 pack-year smoking history. He has never used smokeless tobacco.  Past Surgical History:  Procedure Laterality Date  . ABDOMINAL AORTOGRAM W/LOWER EXTREMITY N/A 02/18/2017   Procedure: ABDOMINAL AORTOGRAM W/LOWER EXTREMITY;  Surgeon: Serafina Mitchell, MD;  Location: [facility];  Service: Cardiovascular;  Laterality: N/A;  . ABDOMINAL AORTOGRAM W/LOWER EXTREMITY N/A 05/05/2018   Procedure: ABDOMINAL AORTOGRAM W/LOWER EXTREMITY;  Surgeon: Serafina Mitchell, MD;  Location: [facility];  Service: Cardiovascular;  Laterality: N/A;  . BACK SURGERY  2003  . CARDIAC ELECTROPHYSIOLOGY STUDY AND ABLATION  03/19/10  . CARDIOVERSION  08/16/08  . CHOLECYSTECTOMY  08/13/02  . CORONARY ARTERY BYPASS GRAFT  1994 & 2005  . FEMORAL-TIBIAL BYPASS GRAFT     Remote  . HERNIA REPAIR  06/03/07  . PERIPHERAL VASCULAR INTERVENTION Left 02/18/2017   Procedure: PERIPHERAL VASCULAR INTERVENTION;  Surgeon: Serafina Mitchell, MD;  Location: [facility];  Service: Cardiovascular;  Laterality: Left;  POPLITEAL  . PERIPHERAL VASCULAR INTERVENTION Bilateral 05/05/2018   Procedure: PERIPHERAL VASCULAR INTERVENTION;  Surgeon: Serafina Mitchell, MD;  Location: [facility];  Service: Cardiovascular;   Laterality: Bilateral;  Rt fem/pop graft and Lt popliteal  . RIGHT/LEFT HEART CATH AND CORONARY/GRAFT ANGIOGRAPHY N/A 11/29/2019   Procedure: RIGHT/LEFT HEART CATH AND CORONARY/GRAFT ANGIOGRAPHY;  Surgeon: Troy Sine, MD;  Location: [facility];  Service: Cardiovascular;  Laterality: N/A;  . VASECTOMY  01/28/08    Allergies  Allergen Reactions  . Fentanyl   . Fentanyl And Related Other (See Comments)    Hallucinations    Immunization History  Administered Date(s) Administered  . Influenza Split 04/20/2014  . Influenza, High Dose Seasonal PF 05/17/2016,  05/12/2017, 04/08/2019  . Influenza,inj,Quad PF,6+ Mos 03/10/2015  . Influenza-Unspecified 06/02/2012  . Moderna SARS-COVID-2 Vaccination 07/19/2019, 08/14/2019  . Pneumococcal Conjugate-13 02/23/2016    Family History  Problem Relation Age of Onset  . Lung cancer Mother        smoked  . Heart failure Father   . Stroke Sister      Current Outpatient Medications:  .  acetaminophen (TYLENOL) 500 MG tablet, Take 1,000 mg by mouth every 6 (six) hours as needed for moderate pain or headache. , Disp: , Rfl:  .  aspirin 81 MG tablet, Take 81 mg by mouth daily., Disp: , Rfl:  .  calcium carbonate (OS-CAL) 600 MG TABS, Take 600 mg by mouth 2 (two) times daily with a meal., Disp: , Rfl:  .  Cholecalciferol (VITAMIN D) 2000 units CAPS, Take 2,000 Units by mouth daily., Disp: , Rfl:  .  digoxin (LANOXIN) 0.125 MG tablet, Take one tablet by mouth daily EXCEPT Do NOT take on Sunday., Disp: 90 tablet, Rfl: 3 .  famotidine (PEPCID) 20 MG tablet, Take 1 tablet by mouth each night at bedtime as directed, Disp: 90 tablet, Rfl: 0 .  folic acid (FOLVITE) 1 MG tablet, Take 1 mg by mouth every evening. , Disp: , Rfl:  .  gentamicin cream (GARAMYCIN) 0.1 %, Apply 1 application topically daily. , Disp: , Rfl:  .  hydrochlorothiazide (MICROZIDE) 12.5 MG capsule, Take 12.5 mg by mouth daily., Disp: , Rfl:  .  niacin (NIASPAN) 500 MG CR tablet,  Take 2,000 mg by mouth every evening. , Disp: , Rfl:  .  oxyCODONE-acetaminophen (PERCOCET) 10-325 MG tablet, Take 1 tablet by mouth 3 (three) times daily as needed for pain. , Disp: , Rfl:  .  pantoprazole (PROTONIX) 40 MG tablet, TAKE 1 TABLET BY MOUTH ONCE DAILY 30 TO 60 MINUTES BEFORE FIRST MEAL OF THE DAY, Disp: 90 tablet, Rfl: 3 .  Pirfenidone (ESBRIET) 267 MG TABS, Take 3 tablets (801 mg total) by mouth 3 (three) times daily., Disp: 810 tablet, Rfl: 5 .  simvastatin (ZOCOR) 20 MG tablet, Take 1 tablet (20 mg total) by mouth daily at 6 PM., Disp: 90 tablet, Rfl: 2 .  warfarin (COUMADIN) 5 MG tablet, Take 1 tablet by mouth daily or as directed by PCP, Disp: 45 tablet, Rfl: 0      Objective:   Vitals:   03/16/20 1534  BP: 116/72  Pulse: 72  Temp: 98.5 F (36.9 C)  TempSrc: Oral  SpO2: 95%  Weight: 159 lb 3.2 oz (72.2 kg)  Height: _0  (1.778 m)    Estimated body mass index is 22.84 kg/m as calculated from the following:   Height as of this encounter: _1  (1.778 m).   Weight as of this encounter: 159 lb 3.2 oz (72.2 kg).  _2 @  Filed Weights   03/16/20 1534  Weight: 159 lb 3.2 oz (72.2 kg)     Physical Exam Pleasant frail elderly male.  Sitting in the wheelchair.  He looks like he has lost more weight than what I remember of him.  He has some crackles in the base as oxygen on normal heart sounds abdomen scaphoid no sinus no clubbing no edema.  Alert and oriented x3.  Good historian.        Assessment:       ICD-10-CM   1. IPF (idiopathic pulmonary fibrosis) (HCC)  J84.112 Hepatic function panel  2. Therapeutic drug monitoring  Z51.81 Hepatic function panel  3. Itching due to drug  L29.8 Hepatic function panel   T50.905A   4. Dysphagia, unspecified type  R13.10 Hepatic function panel  5. Weight loss, abnormal  R63.4 Hepatic function panel       Plan:     Patient Instructions  IPF (idiopathic pulmonary fibrosis) (HCC) Therapeutic drug  monitoring    -Clinically IPF might be worse over time  Plan -Check liver function test today, CBC and chemistry panel today -Continue oxygen -Do high-resolution CT scan of the chest supine and prone in the next few to several weeks -Stop pirfenidone due to weight loss and itching -Recommend high-dose flu shot when available -Recommend Covid booster when available   Itching due to drug -might be due to pirfenidone  Plan -Stop pirfenidone for 3 weeks -Then start pirfenidone at 1 pill 3 times daily for 1 week followed by 2 pills 3 times daily for 1 week and then regular scheduled dose of 3 pills 3 times daily  Dysphagia, unspecified type Weight loss, abnormal  -Weight loss could be because of pirfenidone but also could be because of swallowing difficulties that you are having for the last few months  Plan -Talk to primary care physician and get a GI referral -We will see if the CT scan of the chest is giving [place] any clues for swallowing difficulties and weight loss issues   Follow-up -Next f 4-7 weeks with myself or nurse practitioner for the 30-minute visit on any day to review results and outcomes     SIGNATURE    Dr. Brand Males, M.D., F.C.C.P,  Pulmonary and Critical Care Medicine Staff Physician, [place] Director - Interstitial Lung Disease  Program  Pulmonary [place] [facility] [place], [place], [address]  Pager: [phone], If no answer or between  15:00h - 7:00h: call [phone] Telephone: [phone]  4:27 PM 03/16/2020

## 2020-03-17 LAB — CBC
HCT: 41.3 % (ref 39.0–52.0)
Hemoglobin: 12.7 g/dL — ABNORMAL LOW (ref 13.0–17.0)
MCHC: 30.7 g/dL (ref 30.0–36.0)
MCV: 74.3 fl — ABNORMAL LOW (ref 78.0–100.0)
Platelets: 153 10*3/uL (ref 150.0–400.0)
RBC: 5.55 Mil/uL (ref 4.22–5.81)
RDW: 20.5 % — ABNORMAL HIGH (ref 11.5–15.5)
WBC: 7.8 10*3/uL (ref 4.0–10.5)

## 2020-03-17 LAB — BASIC METABOLIC PANEL
BUN: 15 mg/dL (ref 6–23)
CO2: 30 mEq/L (ref 19–32)
Calcium: 9.5 mg/dL (ref 8.4–10.5)
Chloride: 101 mEq/L (ref 96–112)
Creatinine, Ser: 1.2 mg/dL (ref 0.40–1.50)
GFR: 58.09 mL/min — ABNORMAL LOW (ref >60.00)
Glucose, Bld: 96 mg/dL (ref 70–99)
Potassium: 4.2 mEq/L (ref 3.5–5.1)
Sodium: 138 mEq/L (ref 135–145)

## 2020-03-17 LAB — HEPATIC FUNCTION PANEL
ALT: 11 U/L (ref 0–53)
AST: 21 U/L (ref 0–37)
Albumin: 4.3 g/dL (ref 3.5–5.2)
Alkaline Phosphatase: 77 U/L (ref 39–117)
Bilirubin, Direct: 0.1 mg/dL (ref 0.0–0.3)
Total Bilirubin: 0.5 mg/dL (ref 0.2–1.2)
Total Protein: 7.6 g/dL (ref 6.0–8.3)

## 2020-03-22 ENCOUNTER — Telehealth: Payer: Self-pay | Admitting: Internal Medicine

## 2020-03-22 NOTE — Telephone Encounter (Signed)
Order faxed to number requested

## 2020-04-10 ENCOUNTER — Other Ambulatory Visit: Payer: Self-pay

## 2020-04-10 ENCOUNTER — Ambulatory Visit (HOSPITAL_COMMUNITY): Payer: Medicare Other | Attending: Cardiovascular Disease

## 2020-04-10 DIAGNOSIS — I350 Nonrheumatic aortic (valve) stenosis: Secondary | ICD-10-CM | POA: Insufficient documentation

## 2020-04-10 LAB — ECHOCARDIOGRAM COMPLETE
AR max vel: 1.15 cm2
AV Area VTI: 1.11 cm2
AV Area mean vel: 1.04 cm2
AV Mean grad: 19 mmHg
AV Peak grad: 29.4 mmHg
Ao pk vel: 2.71 m/s
Area-P 1/2: 3.05 cm2
S' Lateral: 5.2 cm

## 2020-04-13 ENCOUNTER — Ambulatory Visit (INDEPENDENT_AMBULATORY_CARE_PROVIDER_SITE_OTHER): Payer: Medicare Other | Admitting: Internal Medicine

## 2020-04-13 ENCOUNTER — Other Ambulatory Visit: Payer: Self-pay

## 2020-04-13 ENCOUNTER — Encounter: Payer: Self-pay | Admitting: Internal Medicine

## 2020-04-13 VITALS — BP 112/70 | HR 71 | Temp 97.9°F | Ht 70.0 in | Wt 159.4 lb

## 2020-04-13 DIAGNOSIS — L298 Other pruritus: Secondary | ICD-10-CM | POA: Diagnosis not present

## 2020-04-13 DIAGNOSIS — J84112 Idiopathic pulmonary fibrosis: Secondary | ICD-10-CM

## 2020-04-13 DIAGNOSIS — I255 Ischemic cardiomyopathy: Secondary | ICD-10-CM

## 2020-04-13 DIAGNOSIS — Z5181 Encounter for therapeutic drug level monitoring: Secondary | ICD-10-CM | POA: Diagnosis not present

## 2020-04-13 DIAGNOSIS — J9611 Chronic respiratory failure with hypoxia: Secondary | ICD-10-CM

## 2020-04-13 DIAGNOSIS — R634 Abnormal weight loss: Secondary | ICD-10-CM

## 2020-04-13 DIAGNOSIS — T50905A Adverse effect of unspecified drugs, medicaments and biological substances, initial encounter: Secondary | ICD-10-CM

## 2020-04-13 DIAGNOSIS — C3431 Malignant neoplasm of lower lobe, right bronchus or lung: Secondary | ICD-10-CM

## 2020-04-13 LAB — HEPATIC FUNCTION PANEL
ALT: 11 U/L (ref 0–53)
AST: 19 U/L (ref 0–37)
Albumin: 4 g/dL (ref 3.5–5.2)
Alkaline Phosphatase: 73 U/L (ref 39–117)
Bilirubin, Direct: 0.2 mg/dL (ref 0.0–0.3)
Total Bilirubin: 0.8 mg/dL (ref 0.2–1.2)
Total Protein: 7.5 g/dL (ref 6.0–8.3)

## 2020-04-13 NOTE — Progress Notes (Signed)
OV 05/12/2017  Chief Complaint  Patient presents with  . Follow-up    Pt states that he is doing good. C/o SOB with exertion, occ. cough with clear to white mucus. Denies any CP.   Follow-up idiopathic pulmonary fibrosis started on Pirfenidone (Esbriet) and June 2018.  Follow left lower lobe lung nodule 8 mm in February 2018   He is here for routine follow-up. Last visit he did see my colleague and noticed some increased shortness of breath but currently this is settled. He is on full dose Pirfenidone (Esbriet) and is tolerating it well without any problems. He is interested in enrolling into the 1 pill format. He denies any worsening shortness of breath or cough that he does have shortness of breath with exertion relieved by rest but this is stable. His most recent liver function test was in September 2018 and normal. His creatinine was slightly elevated at 1.2 mg percent at that time. He is interested in research protocols in the future. He lives one hour away and it would be easier for him to have liver function tests monitoring locally he's not had a follow-up CT scan of the chest for his left lower lobe nodule noted in August 2080 needed have a CT angiogram that showed his lung nodule is stable compared to February 2018.    OV 08/01/2017  Chief Complaint  Patient presents with  . Follow-up    HRCT done 11/13, PET scan done 12/12, and PFT done today. Pt is currently on Esbriet. Pt states he has been doing about the same as last visit.  Occ. cough, SOB with exertion. Denies any CP.     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 Followup RLL lung nodule  In terms of his IPF: He is doing well clinically.  He says he is stable.  However when we walked him he desaturated on the second lap.  In fact pulmonary function test shows a decline.  But he is not feeling it.  He does have portable oxygen with him at home and he does not  use it.  Liver function tests in January 2019 on the seventh was normal.  He did this outside with his primary care physician and he brought the results with me for review.  At this point in time he finished 6 months of liver function test.  After this he will have liver function test every 3 months  Lung nodule: His CT scan recently showed right lower lobe lung nodule.  He had a follow-up PET scan and this shows the lung nodule to be PET hot.  I visualized the scan with Dr. Baltazar Apo and it is too far for bronchoscopy means.  The pretest probability for this being early stage non-small cell lung cancer is extremely high over 95% in my view.    Walking desaturation test on 08/01/2017 185 feet x 3 laps on ROOM AIR:  did YES desaturate. Rest pulse ox was 98%, final pulse ox was 85 at% at 2nd lap and stopped and started on 2L [place] an stayed at 96% for remainder. HR response 50/min at rest to 90 /min at peak exertion at 2nd lap when desaturated. Patient Troy Miller  Yes did Desaturate < 88% . Troy Miller yes  Desaturated </= 3% points. Troy Miller yes did get tachyardic    OV 11/28/2017    Chief Complaint  Patient presents with  . Acute Visit  Pt has c/o SOB and cough with yellow mucus. Pt started on pred taper and abx 5/22.Pt states he hs had some indigestion after taking the doxy. Pt states since started on doxy and pred, SOB has improved. Pt is also still coughing but states mucus is turning clear now.   Troy Miller  presents for follow-up of his idiopathic pulmonary fibrosis and therapeutic monitoring.  He is on Esbriet.  As of January 2019 he was having slow decline in lung function.  But he was tolerating the Pirfenidone (Esbriet) just fine.  At that time he had a PET heart right lower lobe nodule and I referred him to oncology.  Review of the notes indicate that he had 3 settings of radiation therapy and he was doing well after that.  He has a follow-up pending with  radiation oncology.  On Nov 26, 2017 he called our office with acute bronchitis exacerbation and we gave him doxycycline and prednisone.  He is midway through it and is beginning to feel a lot better and is close to baseline.  He did have some nausea and stomach irritation with the doxycycline.  Review of the medication list shows that he is on Niaspan for his hyperlipidemia and also Pirfenidone (Esbriet) for his IPF both of which are prone for GI side effects although at baseline he is never had any problems with those.  At this point in time he is overall feeling good.  He uses oxygen at night and periodically on a subjective basis with exertion   OV 12/30/2017  Chief Complaint  Patient presents with  . Follow-up    Pt states he is about the same as he was at last visit. Pt still becomes SOB, has cough with mostly clear mucus, and sometimes has occ. CP/chest tightness.   Troy Miller , 79 y.o. , with dob 09-Feb-1941 and male ,Not Hispanic or Latino from [place] [id number] - presents to ILD  clinic for IPF followup on Esbreit  In terms of IPF: He is overall stable. He tells me that he takes his aspirin regularly. Does not much of nausea. He is tolerating it fine. He apply sunscreen. Uses oxygen at night. He uses oxygen only at day time with exertion but on a subjective basis even though he desaturated quickly at the end of second lap in the office is documented below which is similar to before. His last liver function was May 2019 in stable. I personally review that result with a normal CK of 55 and AST are 16 and ALT of 12.  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 Radiation was in spring 2019. He is now having for the last several weeks to a few months new onset right lower lobe chest pain that is musculoskeletal. It happens randomly in the daytime particularly when he moves but he is not associating it with a  body movement is no radiation. The symptoms are mild to moderate in intensity. Is no clear cut aggravating or relieving factors. It is not present at night when he sleeping. This no weight loss or hemoptysis. Last imaging was in January 2019 at the time of diagnosis of cancer. His follow-up with radiation oncology on review of the chartshows its on 01/26/2018.     OV 03/31/2018  Subjective:  Patient ID: Troy Miller, male , DOB: 12-04-1940 , age 70 y.o. , MRN: [id number] , ADDRESS: [address] [place] [address]   03/31/2018 -   Chief Complaint  Patient presents with  . Follow-up    PFT performed today. Pt still becomes SOB when he exerts himself, has occ cough with clear to yellow mucus. Denies any CP.     - Follow-up idiopathic pulmonary fibrosis started on Pirfenidone (Esbriet)  - sinceeMay/June 2018.  - [place] RLL lung cancer - s/p XRT Feb 2019 - CT June 2019 - withj 50m LLL new nodule  HPI Troy Miller 79y.o. -follows up IPF.  He is on Esbriet and is tolerating this well.  Although for the last several months he tells me that he had nonspecific itching that is moderate in severity.  He is wondering if it is because of Pirfenidone (Esbriet) but he is on many medications.  He does have dry skin.  He does not use Vaseline.  He continues to be on Niaspan for hyperlipidemia along with statin.  He has an upcoming cardiology appointment.  In terms of his IPF he feels stable.  His walking desaturation test is stable.  His lung function shows a mild decline but then looking at the flow volume loop it appears he has coughed significantly.  He does use oxygen as needed with exertion but is not fully regular with it.  He does not have any other GI side effects or sound side effects of Pirfenidone (Esbriet).  He does apply sunscreen regularly.  There is no rash anywhere.  New issue: He had a CT scan June 2019 and during post radiation surveillance a 5 mm left lower lobe nodule was  discovered this is new.   OV 10/21/2019 - telephone visit, 2 person identifier used, Risk benefit limitation of tele visit  Subjective:  Patient ID: JVivi Miller male , DOB: 7Mar 27, 1942, age 79y.o. , MRN: [id number], [place]: [id number]   - Follow-up idiopathic pulmonary fibrosis started on Pirfenidone ([place]  - sinceeMay/June 2018.  - [place] RLL lung cancer - s/p XRT Feb 2019 - CT June 2019 - withj 565mLLL new nodule  10/21/2019 -  Telephone visit  HPI JaKitt Ledetunsucker [age].o. -on this telephone visit reports that he slowly getting worse in terms of dyspnea.  He uses oxygen with exertion and at night but not at rest.  Otherwise he is feeling fine.  He tolerates his Esbriet fine.  He had a high-resolution CT chest that shows progression.  I personally last saw him in 2019.  In the interim he saw a nuDesigner, jewellery PFTs also show progression.  OV JUne 2021    12/22/2019 Follow up ; ILD, chronic respiratory failure, lung cancer Patient presents for a 2-17-monthllow-up .  Patient has underlying idiopathic pulmonary fibrosis is on Esbriet 3 capsules 3 times daily.  Patient says overall he continues to get very short of breath with minimal activity.  He tries to be active but wears out easily.  Says he has been dealing with tachycardia recently.  And has recently started digoxin.  He is followed by cardiology for cardiomyopathy and A. Fib.  He remains on Coumadin. Patient underwent a recent cardiac cath that showed 2 downed grafts with patent LIMA/RIMA with good collateralization.  No intervention was recommended. Cardiology notes indicate referral to EP and possible cardioversion. Patient denies any increased cough.  Says his O2 saturations have been okay on oxygen between 2 to 3 L.     OV 03/16/2020  Subjective:  Patient ID: Troy Miller, male , DOB: 08-Jun-1941, age 79 y.o. years. , MRN: [id number],  ADDRESS: [place] [address] PCP   [name], PA-C Providers : Treatment Team:  Attending Provider: Brand Males, MD   Chief Complaint  Patient presents with  . Follow-up    pt states esbriet causes itching     - Follow-up idiopathic pulmonary fibrosis started on Pirfenidone (Esbriet)  - sinceeMay/June 2018.  - [place] RLL lung cancer - s/p XRT Feb 2019 - CT June 2019 - withj 79m LLL new nodule  -Chronic hypoxemic respiratory failure due to the above.  R HPI JKennen StammerHunsucker 80y.o. - presents for follow-up.  I meeting his wife for the first time.  I personally not seen him face-to-face in a long time.  He has lost a lot of weight.  His shortness of breath is also worse.  Wife reports dysphagia for solids and the food getting stuck in the mouth in the throat for the last few to several months.  He is having to drink water to clear the solid being stuck.  His last CT scan of the chest was in March 2020.  At home he uses 3 L of oxygen.  Wife is also reporting significant itching that she strongly believes is because of pirfenidone.  She says ever since he started pirfenidone he has had diffuse itching.  Did use hydrocortisone cream on a as needed basis to control this.  He has completed his radiation treatment.  He has had his Covid vaccine and is trying to wait to get a booster.  He is not had his flu shot for the season as yet.   IMPRESSION: HRCT 1. Evolving radiation fibrosis in the posterior right lower lobe, without evidence of local tumor recurrence. 2. No findings highly suspicious for metastatic disease in the chest. Mild right hilar adenopathy is stable and potentially reactive. Several scattered solid left pulmonary nodules are stable since 2018 and probably benign. No new significant pulmonary nodules. 3. 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 [place] Iss 5, [phone] Mar 08 2017. 4. Mild cardiomegaly. 5. Stable calcified bilateral pleural plaques and smooth pleural thickening without significant pleural effusions, compatible with asbestos related pleural disease.  Aortic Atherosclerosis (ICD10-I70.0) and Emphysema (ICD10-J43.9).   Electronically Signed   By: JIlona SorrelM.D.   On: 09/17/2018 16:21 ROS - per HPI    OV 04/13/2020   Subjective:  Patient ID: JVivi Miller male , DOB: 712-15-1942 age 89y.o. years. , MRN: [id number]  ADDRESS: [address] [id number]  BTheodosia Blender PA-C Providers : Treatment Team:  Attending Provider: RBrand Males MD Other provider: Dr TCorky Downscards   Chief Complaint  Patient presents with  . Follow-up    Pt states his breathing has become worse since last visit. states he is also coughing getting up white to yellow phlegm. Denies any complaints of wheezing. Pt states he did start taking Esbriet again after being off of it x3 weeks. States he started back on med 10/4   - Follow-up idiopathic pulmonary fibrosis started on Pirfenidone (Esbriet)  - sinceeMay/June 2018.  - [place] RLL lung cancer - s/p XRT Feb 2019 - CT June 2019 - withj 578mLLL new nodule  -  Chronic hypoxemic respiratory failure due to the abo  - chronci systlic CHF - ef 25% OCt 2020, 25% in June/Oct 2021   HPI Troy Miller 79 y.o. -presents for follow-up.  At last visit we were concerned about progression pulmonary fibrosis and also needed to know his lung cancer staging.  At first he said he did not have a follow-up CT scan of the chest as I suggested.  Later he and his significant other recollected they had it in [place].  I could not get hold of this results.  I do not see anywhere these results have been faxed to me.  I have noted in thoracic radiology Dr. Lorin Picket to review  that and get back to me.  In the interim he has seen cardiology.  He had a another echocardiogram that shows potential severe aortic stenosis but also severe chronic systolic heart failure ejection fraction 20%.  His oxygen needs at 3 L at rest.  But here he is on 5 L pulsed and when he stood up it went down to 88%.    The combination of all this he is lost weight.  Is continue to lose weight.  In terms of his itching which the significant other thought might be due to pirfenidone.  He stopped his pirfenidone for 3 weeks but his itching did not go away at all.  He is not sure that even improved.  Therefore he is back on his pirfenidone.  The itching is not any worse.  He will have his flu vaccine today.  He is in need of a Covid booster.      SYMPTOM SCALE - ILD 04/13/2020 Last Weight  Most recent update: 04/13/2020  2:25 PM   Weight  72.3 kg (159 lb 6.4 oz)             O2 use *3L at res  Shortness of Breath 0 -> 5 scale with 5 being worst (score 6 If unable to do)  At rest Did not answer  Simple tasks - showers, clothes change, eating, shaving 4  Household (dishes, doing bed, laundry) x  Shopping x  Walking level at own pace 4  Walking up Stairs 4  Total (30-36) Dyspnea Score x  How bad is your cough? 2  How bad is your fatigue 3  How bad is nausea x  How bad is vomiting?  x  How bad is diarrhea? xx  How bad is anxiety? x  How bad is depression x         Simple office walk 185 feet x  3 laps goal with forehead probe 11/28/2017  12/30/2017  03/31/2018  04/13/2020   O2 used Room air Room air Room air   Number laps completed 1 2 - stopeed at end due to desat 2 - stopped at end of 2 lapst due to desat   Comments about pace Normal pace slow Normal pace   Resting Pulse Ox/HR 93% and 63/min 96%/61/min     97% and 76/min   Final Pulse Ox/HR 84% and 91/min 87% and 97/min 86% and 91/min   Desaturated </= 88% yes yes ues   Desaturated <= 3% points yes yes ues   Got  Tachycardic >/= 90/min yes yes yes   Symptoms at end of test x x Mild dyspnea   Miscellaneous comments desatruiated in 1 lap, corrected with 2L [place] at 1 lap desat after 2 laps - same ass before Similar to before  PFT Results Latest Ref Rng & Units 12/10/2018 03/31/2018 08/01/2017 12/09/2016 05/03/2016 04/14/2015  FVC-Pre L 2.99 3.35 3.53 3.84 3.70 3.96  FVC-Predicted Pre % 70 79 82 89 85 90  FVC-Post L - - - - 3.92 4.03  FVC-Predicted Post % - - - - 90 92  Pre FEV1/FVC % % 94 85 80 76 75 72  Post FEV1/FCV % % - - - - 73 72  FEV1-Pre L 2.83 2.85 2.83 2.92 2.78 2.86  FEV1-Predicted Pre % 93 94 92 94 89 90  FEV1-Post L - - - - 2.85 2.91  DLCO uncorrected ml/min/mmHg 8.42 10.69 10.88 11.70 12.45 13.79  DLCO UNC% % 33 31 32 34 36 40  DLCO corrected ml/min/mmHg - - 11.59 11.02 12.13 -  DLCO COR %Predicted % - - 34 32 36 -  DLVA Predicted % 38 41 41 41 50 56  TLC L - - - - 5.96 5.72  TLC % Predicted % - - - - 82 78  RV % Predicted % - - - - 77 57   IMPRESSIONS   ECHO OCt 2021 1. Left ventricular ejection fraction, by estimation, is 20 to 25%. The  left ventricle has severely decreased function. The left ventricle  demonstrates global hypokinesis. The left ventricular internal cavity size  was mildly dilated. Left ventricular  diastolic parameters are consistent with Grade I diastolic dysfunction  (impaired relaxation). Elevated left ventricular end-diastolic pressure.  2. Right ventricular systolic function is severely reduced. The right  ventricular size is normal. There is moderately elevated pulmonary artery  systolic pressure.  3. Left atrial size was moderately dilated.  4. Right atrial size was moderately dilated.  5. The mitral valve is normal in structure. Trivial mitral valve  regurgitation. No evidence of mitral stenosis.  6. Based on peak gradient and mean velocity, the aortic stenosis is mild.  However visually, it appears to be severe. Likely low flow-low gradient   aortic stenosis.. The aortic valve is normal in structure. There is severe  calcifcation of the aortic  valve. There is severe thickening of the aortic valve. Aortic valve  regurgitation is not visualized. No aortic stenosis is present. Aortic  valve area, by VTI measures 1.11 cm. Aortic valve mean gradient measures  19.0 mmHg. Aortic valve Vmax measures  2.71 m/s.  7. The inferior vena cava is dilated in size with <50% respiratory  variability, suggesting right atrial pressure of 15 mmHg.     has a past medical history of AAA (abdominal aortic aneurysm) ([place] 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   reports that he quit smoking about 33 years ago. His smoking use included cigarettes. He has a 50.00 pack-year smoking history. He has never used smokeless tobacco.  Past Surgical History:  Procedure Laterality Date  . ABDOMINAL AORTOGRAM W/LOWER EXTREMITY N/A 02/18/2017   Procedure: ABDOMINAL AORTOGRAM W/LOWER EXTREMITY;  Surgeon: Serafina Mitchell, MD;  Location: [facility];  Service: Cardiovascular;  Laterality: N/A;  . ABDOMINAL AORTOGRAM W/LOWER EXTREMITY N/A 05/05/2018   Procedure: ABDOMINAL AORTOGRAM W/LOWER EXTREMITY;  Surgeon: Serafina Mitchell, MD;  Location: [facility];  Service: Cardiovascular;  Laterality: N/A;  . BACK SURGERY  2003  . CARDIAC ELECTROPHYSIOLOGY STUDY AND ABLATION  03/19/10  . CARDIOVERSION  08/16/08  . CHOLECYSTECTOMY  08/13/02  . CORONARY ARTERY BYPASS GRAFT  1994 & 2005  . FEMORAL-TIBIAL BYPASS GRAFT     Remote  . HERNIA REPAIR  06/03/07  . PERIPHERAL VASCULAR INTERVENTION Left 02/18/2017   Procedure: PERIPHERAL VASCULAR INTERVENTION;  Surgeon: Serafina Mitchell, MD;   Location: [facility];  Service: Cardiovascular;  Laterality: Left;  POPLITEAL  . PERIPHERAL VASCULAR INTERVENTION Bilateral 05/05/2018   Procedure: PERIPHERAL VASCULAR INTERVENTION;  Surgeon: Serafina Mitchell, MD;  Location: [facility];  Service: Cardiovascular;  Laterality: Bilateral;  Rt fem/pop graft and Lt popliteal  . RIGHT/LEFT HEART CATH AND CORONARY/GRAFT ANGIOGRAPHY N/A 11/29/2019   Procedure: RIGHT/LEFT HEART CATH AND CORONARY/GRAFT ANGIOGRAPHY;  Surgeon: Troy Sine, MD;  Location: [facility];  Service: Cardiovascular;  Laterality: N/A;  . VASECTOMY  01/28/08    Allergies  Allergen Reactions  . Fentanyl   . Fentanyl And Related Other (See Comments)    Hallucinations    Immunization History  Administered Date(s) Administered  . Influenza Split 04/20/2014  . Influenza, High Dose Seasonal PF 05/17/2016, 05/12/2017, 04/08/2019  . Influenza,inj,Quad PF,6+ Mos 03/10/2015  . Influenza-Unspecified 06/02/2012  . Moderna SARS-COVID-2 Vaccination 07/19/2019, 08/14/2019  . Pneumococcal Conjugate-13 02/23/2016    Family History  Problem Relation Age of Onset  . Lung cancer Mother        smoked  . Heart failure Father   . Stroke Sister      Current Outpatient Medications:  .  acetaminophen (TYLENOL) 500 MG tablet, Take 1,000 mg by mouth every 6 (six) hours as needed for moderate pain or headache. , Disp: , Rfl:  .  aspirin 81 MG tablet, Take 81 mg by mouth daily., Disp: , Rfl:  .  calcium carbonate (OS-CAL) 600 MG TABS, Take 600 mg by mouth 2 (two) times daily with a meal., Disp: , Rfl:  .  Cholecalciferol (VITAMIN D) 2000 units CAPS, Take 2,000 Units by mouth daily., Disp: , Rfl:  .  digoxin (LANOXIN) 0.125 MG tablet, Take one tablet by mouth daily EXCEPT Do NOT take on Sunday., Disp: 90 tablet, Rfl: 3 .  famotidine (PEPCID) 20 MG tablet, Take 1 tablet by mouth each night at bedtime as directed, Disp: 90 tablet, Rfl: 0 .  folic acid (FOLVITE) 1 MG  tablet, Take 1 mg by mouth every evening. , Disp: , Rfl:  .  hydrochlorothiazide (MICROZIDE) 12.5 MG capsule, Take 12.5 mg by mouth daily., Disp: , Rfl:  .  niacin (NIASPAN) 500 MG CR tablet, Take 2,000 mg by mouth every evening. , Disp: , Rfl:  .  oxyCODONE-acetaminophen (PERCOCET) 10-325 MG tablet, Take 1 tablet by mouth 3 (three) times daily as needed for pain. , Disp: , Rfl:  .  pantoprazole (PROTONIX) 40 MG tablet, TAKE 1 TABLET BY MOUTH ONCE DAILY 30 TO 60 MINUTES BEFORE FIRST MEAL OF THE DAY, Disp: 90 tablet, Rfl: 3 .  Pirfenidone (ESBRIET) 267 MG TABS, Take 3 tablets (801 mg total) by mouth 3 (three) times daily., Disp: 810 tablet, Rfl: 5 .  simvastatin (ZOCOR) 20 MG tablet, Take 1 tablet (20 mg total) by mouth daily at 6 PM., Disp: 90 tablet, Rfl: 2 .  warfarin (COUMADIN) 5 MG tablet, Take 1 tablet by mouth daily or as directed by PCP, Disp: 45 tablet, Rfl: 0      Objective:   Vitals:   04/13/20 1422  BP: 112/70  Pulse: 71  Temp: 97.9 F (36.6 C)  TempSrc: Other (Comment)  SpO2: (!) 88%  Weight: 159 lb 6.4 oz (72.3 kg)  Height: _0  (1.778 m)    Estimated body mass index is 22.87 kg/m as calculated from the following:   Height as of this encounter: _1  (1.778 m).   Weight as of this encounter: 159 lb 6.4 oz (72.3 kg).  _2 @  Filed Weights   04/13/20 1422  Weight: 159 lb 6.4 oz (72.3 kg)     Physical Exam  General: No distress. Frail., sittting on wheel chair Neuro: Alert and Oriented x 3. GCS 15. Speech normal Psych: Pleasant Resp:  Barrel Chest - no.  Wheeze - no, Crackles - yes, No overt respiratory distress CVS: Normal heart sounds. Murmurs - no Ext: Stigmata of Connective Tissue Disease - none HEENT: Normal upper airway. PEERL +. No post nasal drip        Assessment:       ICD-10-CM   1. IPF (idiopathic pulmonary fibrosis) ([facility])  J84.112   2. Chronic respiratory failure with hypoxia (HCC)  J96.11   3. Therapeutic drug monitoring   Z51.81   4. Itching due to drug  L29.8    T50.905A   5. Weight loss, abnormal  R63.4   6. Malignant neoplasm of lower lobe of right lung (HCC)  C34.31        Plan:     Patient Instructions  IPF (idiopathic pulmonary fibrosis) (HCC) Therapeutic drug monitoring    -Clinically IPF likely  worse over time -Not able to see your recent high-resolution CT chest because it was done in [place] -Somewhat weak to do pulmonary function test  Plan -Check liver function test today  today -Continue oxygen -3 L at rest but just to get the pulse ox greater than 88% -I have written to a local radiologist to take a look at your scan from [facility] and then get back to me  -As soon as I  hear from them I will let you know -Okay to continue pirfenidone -Recommend high-dose flu shot when available -Recommend Covid booster when available   Itching   -Sounds like this is not related to pirfenidone because stopping it did not make the itching better or go away  Plan -Discussed with the primary care physician  Dysphagia, unspecified type Weight loss, abnormal  -Weight loss could be because of pirfenidone but also could be because of swallowing difficulties that you are having for the last few months -Could also be because of the heart issues especially the weight loss  Plan - Please make sure you have discussed this with your primary care physician -We will see if the CT scan of the chest is giving [place] any clues for swallowing difficulties and weight loss issues -Address cardiac issues with Dr. Burt Knack  Flu Vaccine Need  Plan  - high dose flu shot 04/13/2020   Lung Cancer Hx   - CT scan will shows any evidence of recurrence; will let you know what radiology says  Aortic stenosis    -keep appt with Dr Burt Knack May 01, 2020.  He would like to know the CT scan results -I will update this in the chart as soon as I hear from radiology.  This will help him plan about how to manage your  valve  issues    . Follow-up -8 weeks with Dr. Chase Caller or sooner if needed -ILD symptom score at follow-up 20.   ( Level 05 visit: Estb 40-54 min  in  visit type:  on-site physical face to visit  in total care time and counseling or/and coordination of care by this undersigned MD - Dr Brand Males. This includes one or more of the following on this same day 04/13/2020: pre-charting, chart review, note writing, documentation discussion of test results, diagnostic or treatment recommendations, prognosis, risks and benefits of management options, instructions, education, compliance or risk-factor reduction. It excludes time spent by the [facility] or office staff in the care of the patient. Actual time 40 min)   SIGNATURE    Dr. Brand Males, M.D., F.C.C.P,  Pulmonary and Critical Care Medicine Staff Physician, [place] Director - Interstitial Lung Disease  Program  [facility] [place] [facility] [place], [place], [address]  Pager: [phone], If no answer or between  15:00h - 7:00h: call [phone] Telephone: [phone]  3:01 PM 04/13/2020

## 2020-04-13 NOTE — Patient Instructions (Addendum)
IPF (idiopathic pulmonary fibrosis) (HCC) Therapeutic drug monitoring    -Clinically IPF likely  worse over time -Not able to see your recent high-resolution CT chest because it was done in  -Somewhat weak to do pulmonary function test  Plan -Check liver function test today  today -Continue oxygen -3 L at rest but just to get the pulse ox greater than 88% -I have written to a local radiologist to take a look at your scan from [facility] and then get back to me  -As soon as I  hear from them I will let you know -Okay to continue pirfenidone -Recommend high-dose flu shot when available -Recommend Covid booster when available   Itching   -Sounds like this is not related to pirfenidone because stopping it did not make the itching better or go away  Plan -Discussed with the primary care physician  Dysphagia, unspecified type Weight loss, abnormal  -Weight loss could be because of pirfenidone but also could be because of swallowing difficulties that you are having for the last few months -Could also be because of the heart issues especially the weight loss  Plan - Please make sure you have discussed this with your primary care physician -We will see if the CT scan of the chest is giving [place] any clues for swallowing difficulties and weight loss issues -Address cardiac issues with Dr. Burt Knack  Flu Vaccine Need  Plan  - high dose flu shot 04/13/2020   Lung Cancer Hx   - CT scan will shows any evidence of recurrence; will let you know what radiology says  Aortic stenosis    -keep appt with Dr Burt Knack May 01, 2020.  He would like to know the CT scan results -I will update this in the chart as soon as I hear from radiology.  This will help him plan about how to manage your  valve issues    . Follow-up -8 weeks with Dr. Chase Caller or sooner if needed -ILD symptom score at follow-up 20.

## 2020-04-15 NOTE — Progress Notes (Signed)
LFT normal on anti fibrotic. Will not call in this normal result

## 2020-04-18 ENCOUNTER — Encounter: Payer: Self-pay | Admitting: Cardiovascular Disease

## 2020-04-18 ENCOUNTER — Other Ambulatory Visit: Payer: Self-pay | Admitting: Internal Medicine

## 2020-04-18 ENCOUNTER — Ambulatory Visit (INDEPENDENT_AMBULATORY_CARE_PROVIDER_SITE_OTHER): Payer: Medicare Other | Admitting: Cardiovascular Disease

## 2020-04-18 ENCOUNTER — Ambulatory Visit: Payer: Medicare Other | Admitting: Cardiovascular Disease

## 2020-04-18 ENCOUNTER — Other Ambulatory Visit: Payer: Self-pay

## 2020-04-18 DIAGNOSIS — I714 Abdominal aortic aneurysm, without rupture, unspecified: Secondary | ICD-10-CM

## 2020-04-18 DIAGNOSIS — I350 Nonrheumatic aortic (valve) stenosis: Secondary | ICD-10-CM

## 2020-04-18 DIAGNOSIS — Z95820 Peripheral vascular angioplasty status with implants and grafts: Secondary | ICD-10-CM

## 2020-04-18 DIAGNOSIS — I255 Ischemic cardiomyopathy: Secondary | ICD-10-CM

## 2020-04-18 DIAGNOSIS — Z951 Presence of aortocoronary bypass graft: Secondary | ICD-10-CM | POA: Diagnosis not present

## 2020-04-18 DIAGNOSIS — J84112 Idiopathic pulmonary fibrosis: Secondary | ICD-10-CM

## 2020-04-18 DIAGNOSIS — I5020 Unspecified systolic (congestive) heart failure: Secondary | ICD-10-CM

## 2020-04-18 DIAGNOSIS — I2510 Atherosclerotic heart disease of native coronary artery without angina pectoris: Secondary | ICD-10-CM

## 2020-04-18 DIAGNOSIS — I484 Atypical atrial flutter: Secondary | ICD-10-CM

## 2020-04-18 NOTE — Progress Notes (Signed)
Patient ID: Troy Miller, male   DOB: Aug 10, 1940, 79 y.o.   MRN: [id number]     HPI: Troy Miller is a 79 y.o. male who presents for a 5 month cardiology and sleep evaluation.   Troy Miller has a history of CAD and in 1994 underwent initial CABG revascularization surgery. In May 2005 he underwent redo surgery with a RIMA to the LAD, vein graft to distal RCA by [name]. His previous LIMA graft was preserved and supplied the OM1 vessel. He has history of atrial flutter status post ablation by [name] as well as a history of PAF. He has documented peripheral vascular disease with abdominal aortic aneurysm, as well as common iliac artery aneurysms. He status post right fem-tib bypass graft surgery. Additional problems include hypertension as well as mixed hyperlipidemia. He also has mild aortic stenosis and documentation of an infrarenal abdominal aortic aneurysm.   A followup echo Doppler study on 05/11/2013 showed an ejection fraction in the 45-50% range with mild inferior hypokinesis.  There was grade 1 diastolic dysfunction.  His aortic valve was moderately calcified and there was evidence for mild aortic valve stenosis with a peak gradient of 19 and mean gradient of 10. Aortic valve area was 1.5 1.64 cm.  There was mild aortic root dilatation at 42 mm mild dilatation of the ascending aorta..  There was mitral regurgitation.  A follow-up abdominal aortic Doppler study on April 29, 2014 demonstrated an infrarenal fusiform aneurysm at 3.63.7 with mild amount of atherosclerosis visualized throughout.  The right and left proximal common iliac arteries also demonstrated aneurysmal dilatation.  The right common iliac artery measured 2.3.  A 2.3 without evidence for stenosis.  Left common iliac artery measured 3.2 x 3.0 with mild amount of thrombus and low flow velocity.  He underwent a follow-up ultrasound last week which showed slight progression.  His aneurysm in the distal aorta, now  measured 4.03.7.  There was a new finding of a right common iliac artery aneurysm measuring 2.2 by 2.3 cm.  The left common iliac artery was stable at 3.2 x 3 point 0 cm.  The IVC veins were patent.  A follow-up evaluation was recommended in one year.  A follow-up echo Doppler study on 11/04/2014 demonstrated an EF 50-55% without regional wall motion abnormalities.  There was evidence for mild aortic valve stenosis valve area of 1. 4 9 and 1.43 by measurements. Mean gradient was 14 and peak gradient 32 mmHg.  He saw Troy Miller with complaints of chest discomfort in 2016.  A nuclear perfusion study was  on 02/10/2015:  Ejection fraction was 47%. There was evidence for prior inferior perfusion defect suggesting prior infarction.  There was no ischemia.  He underwent several treatment of skin lesions on his face by his dermatologist and will need additional treatment for 1.  On his nose which he states was positive for mild malignancy. He denies anginal symptoms.  He denies PND, orthopnea.  He denies bleeding.   [name] has a history of obstructive sleep apnea dating back to 2005.  His initial sleep study showed severe sleep apnea with an AHI of 33.9, and in addition to obstructive events had central events.  Initial O2 desaturation was 81%.  Remotely he had seen Dr. [name], he had undergone a trial of BiPAP, but reportedly had felt better with CPAP therapy.  He has not used CPAP therapy for at least 3-4 years.  His DME company had been [employer] which is  no longer in business.  He admits to daytime sleepiness.  He does snore.  He denies any recent CHF symptomatology.   I referred him for a new sleep study which was done on 12/22/2015 and confirmed moderate sleep apnea with an AHI overall of 26 per hour.  However, sleep apnea was severe during REM sleep with an AHI 51.4.  He had significant oxygen desaturation to 82% in time below 88% was 76.4 minutes.  There was moderate snoring.  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  He received  a new CPAP machine.  He admits to 100% compliance.  His sleep has improved.  He denies daytime sleepiness and is unaware of breakthrough snoring.  I  saw him in September 2018 was also being seen by the [place].Marland Kitchen  Remotely he developed cough secondary to ace inhibition.  He is seeing Dr. 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  Occasionally he require supplemental oxygen if he is walking but typically does not use oxygen routinely.    An echo Doppler study from December 26, 2017 showed an EF of 35 to 40% with to mid inferolateral inferior inferoseptal mild hypokinesis.  There was grade 1 diastolic dysfunction.  There was mild aortic stenosis with a mean gradient of 21 and peak gradient of 31.  The ascending aorta was felt to be mildly dilated.  His RV was mildly dilated.  He had mild increased PA pressure at 34 mmHg. He underwent abdominal aortic Doppler to assess his abdominal aortic aneurysm on January 16, 2018  which showed the largest diameter 3.7 cm, essentially unchanged from prior exams.  Lower extremity duplex showed normal staying ABIs at his right toe brachial index was abnormal as well as his left toe brachial index.  He is being evaluated by Dr. Onnie Graham for intersitital pulmonary fibrosis and was started on Esbriet.  His LFTs have been normal.  He has a history of significant mixed hyperlipidemia and for this reason had been on combination niacin with statin.  ALT on March 31, 2018 was normal at 7 and AST was 13.  He previously had been on omega-3 fatty acids but apparently these were discontinued by Dr. Melvyn Novas according to the patient because of his asbestosis.  He had not had any recent lipid studies.   He underwent lower extremity PV angiography and intervention by Dr. Trula Slade on May 05, 2018 which demonstrated aneurysmal degeneration of the infrarenal  abdominal aorta and bilateral iliac arteries. The right common femoral profundofemoral artery were heavily calcified but patent.  The SFA was occluded.  A bypass graft originating from the SFA had a proximal stenosis and luminal irregularity at the anastomosis. Runoff was to the peroneal artery.  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  He underwent stent to the right SFA and a stent to the left popliteal artery.  He was evaluated in a telemedicine visit on October 26, 2018.  He believes his blood pressure has been controlled.  He continues to be on valsartan/HCTZ 80/12.5. Recent lipid studies were excellent on his current regimen with simvastatin and niacin with total cholesterol 109 triglycerides 106 HDL 45 LDL 43.  He had been on Xarelto 20 mg and has a remote history of atrial fibrillation ablation.  He has been maintaining sinus rhythm.  Unfortunately  the cost of Xarelto was too much and he was having to pay $266 per month out of pocket.  As result he was switched to warfarin Last week he had called the office and was to warfarin.    Since his April 2020 telemedicine evaluation, he believes he has done fairly well. He is on supplemental oxygen 2 L/min at home.  However he has a portable oxygen unit which he takes when he walks and typically it is set at 5 L/min.  He is followed by Dr. Chase Caller.  He underwent high-resolution CT of his chest in follow-up of his interstitial lung disease and stage I right lower lobe lung cancer treated with radiation therapy concluded in March 2019.  The September 17, 2018 CT demonstrated evolving radiation fibrosis in the posterior right lower lobe without evidence for local tumor recurrence.  He had basilar predominant fibrotic interstitial lung disease with mild honeycombing slightly progressed since 2018.  There also was stable calcified bilateral pleural plaques and smooth pleural thickening  without effusion.  He underwent follow-up vascular imaging studies in July and a Doppler of his abdominal aorta showed dilation of the distal aorta now measuring 3.9 cm compared to 3.6 cm in July 2019.  There was mild dilation of the right common iliac and left common iliac arteries.  Lower extremity Doppler study showed widely patent right proximal SFA stent without restenosis and a widely patent right proximal SFA to proximal peroneal artery bypass graft without restenosis.  The distal left CFA measured 1.8 cm.  There was a widely patent left proximal SFA to proximal TPT stent without stenosis.  He denies any anginal symptoms.  He does get short of breath with walking.  An echo Doppler study and profile showed mild improvement in LV function with EF now at 40 to 45%.  Aortic stenosis was moderate with a peak gradient of 33, mean gradient of 23, and aortic valve area of 1.3 cm.  He admits to occasional palpitations but denies presyncope.    I saw him on August 23, 2019.  Since his prior evaluation in November 2020 he had worn  a cardiac monitor for 2 weeks.  This demonstrated predominant sinus rhythm with an average heart rate at 65 bpm.  The slowest heart rate was sinus bradycardia at 47 which occurred at 12:50 AM on June 21, 2019 and sinus tachycardia at 111 bpm at which occurred at 8:23 AM on December 15.  He was found to have 4 episodes of arrhythmia with 2 episodes of atrial fibrillation with multifocal PVCs, and 2 episodes of atrial flutter with variable block.  He is now on supplemental oxygen.  He has a long history of prior asbestosis exposure.  He had not been using his CPAP.  He received a new machine from the [place].  He is on warfarin anticoagulation.  He has been on valsartan HCT 80/12.5 for hypertension.  He is on finasteride and tamsulosin for prostate issues.  He is on Esbriet for his interstitial lung disease.  During his evaluation with me, his blood pressure was low at 82/55 supine and  80/56 standing and I recommended he discontinue valsartan HCT 80/12.5 mg daily and in its place initiated valsartan 40 mg alone.  He was evaluated by Dr. Chase Caller in April 2021 for his idiopathic pulmonary fibrosis.  He underwent a follow-up echo Doppler study on October 18, 2019.  EF was further depressed now at 20 to 25% and there was global hypokinesis.  Left ventricular internal  dimensions were internal dimensions were  dilated at 6.3 cm at end diastole and 5.0 cm and in systole.  Aortic root diameter was 4.1 cm.  It is in the low EF, peak and mean gradients were 26 and 12 mm respectively.  His LVOT/AV VTI ratio was 0.22 consistent with severe AS.   I saw him for follow-up evaluation of his echo Doppler study on 10/25/2019.  During that time he denied any chest pain.  He continues to use supplemental oxygen.  He had noticed progressive shortness of breath.  I reviewed his echo Doppler studies with him in detail and he had severe reduction in aortic valve excursion and I raise concern that he may very well have low gradient severe aortic valve stenosis which may be contributing to his reduced LV function.  I also discussed the possibility of progressive coronary obstructive disease leading also to reduced LV function and undoubtably that his idiopathic pulmonary fibrosis was also contributing to his progressive dyspnea.  During that evaluation I discussed possible right and left heart cardiac catheterization for definitive evaluation.  I recommended that he contemplate undergoing this procedure and had discussed with him due to significant comorbidities he would not be a good candidate for third CABG surgery or surgical aortic valve replacement but TAVR may be a potential option if was severe.  When I saw him on Nov 24, 2019 he states that over the past month he noted more shortness of breath. He continued to be on 3 L of oxygen typically at night and when he is active.  He has not been using CPAP with  consistency.  He has previously documented sleep apnea.  At times he also states he has not been 100% using his supplemental oxygen at night.  During that evaluation, I recommended definitive right and left heart cardiac catheterization.  On May 24 he underwent right and left heart cardiac catheterization.  He had moderate increase in right heart pressures with pulmonary arterial hypertension with a PA pressure of 53/21 and a mean PA pressure of 32 mm.  PVR was 5.7 WU consistent with his pulmonary arterial hypertension.  Catheterization revealed severe native CAD with 70% ostial LAD stenosis with total occlusion of the LAD after proximal diagonal vessel.  There was 80% stenosis in the circumflex and the native RCA was totally occluded proximally.  He had a patent LIMA graft supplying a large marginal vessel of the circumflex with collateralization to the distal RCA/PDA/PLA vessels.  He had a patent RIMA graft supplying the mid LAD had a patent old vein graft supplying the diagonal branch of the LAD.  2 vein grafts from the 2005 surgery and 2 vein graft from the 1994 surgery were occluded.  I was able to cross his aortic valve and it was felt to have mild aortic stenosis with a mean gradient of 10.8 mmHg.  AVA calculated at 2.1 cm.  Subsequently, he denies any recent chest pain.  He had developed a rash and Esbriet had been discontinued.  He has recently seen Dr. Chase Caller and Jacklynn Bue was reinstituted for his idiopathic pulmonary fibrosis with plans to try to titrate him up back to 3 tablets 3 times a day.  He has seen Dr. Lovena Le in July there is now on digoxin for his atypical atrial flutter/fib.  He had recently undergone a follow-up echo Doppler study on April 11, 2020 which showed an EF of 20 to 70%, grade 1 diastolic dysfunction, moderate biatrial enlargement, and mean aortic gradient of  19 with peak gradient 29.4 with valve area of 1.1.  He was referred by Roby Lofts, PA-C to Dr. Burt Knack for  structural heart team evaluation which is scheduled in several weeks.  Past Medical History:  Diagnosis Date  . AAA (abdominal aortic aneurysm) ([place])   . Aortic stenosis, mild    07/10/12 EF 50-55% on echo-mitral annular ca+ also  . Atrial fibrillation ([place])    03/19/10 ablation-[name]  . Atrial flutter ([place])   . CAD (coronary artery disease)   . DVT (deep venous thrombosis) ([place])   . History of stress test    Myoview 8/16:  EF 47%, inferior scar, no ischemia, Intermediate risk (low EF)  . Hyperlipemia   . Hypertension   . Iliac artery aneurysm ([place])   . PVD (peripheral vascular disease) ([place])    remote right fem-tib bypass graft sugery  . 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 ABDOMINAL AORTOGRAM W/LOWER EXTREMITY N/A 02/18/2017   Procedure: ABDOMINAL AORTOGRAM W/LOWER EXTREMITY;  Surgeon: Serafina Mitchell, MD;  Location: [facility];  Service: Cardiovascular;  Laterality: N/A;  . ABDOMINAL AORTOGRAM W/LOWER EXTREMITY N/A 05/05/2018   Procedure: ABDOMINAL AORTOGRAM W/LOWER EXTREMITY;  Surgeon: Serafina Mitchell, MD;  Location: [facility];  Service: Cardiovascular;  Laterality: N/A;  . BACK SURGERY  2003  . CARDIAC ELECTROPHYSIOLOGY STUDY AND ABLATION  03/19/10  . CARDIOVERSION  08/16/08  . CHOLECYSTECTOMY  08/13/02  . CORONARY ARTERY BYPASS GRAFT  1994 & 2005  . FEMORAL-TIBIAL BYPASS GRAFT     Remote  . HERNIA REPAIR  06/03/07  . PERIPHERAL VASCULAR INTERVENTION Left 02/18/2017   Procedure: PERIPHERAL VASCULAR INTERVENTION;  Surgeon: Serafina Mitchell, MD;  Location: [facility];  Service: Cardiovascular;  Laterality: Left;  POPLITEAL  . PERIPHERAL VASCULAR INTERVENTION Bilateral 05/05/2018   Procedure: PERIPHERAL VASCULAR INTERVENTION;  Surgeon: Serafina Mitchell, MD;  Location: [facility];  Service: Cardiovascular;  Laterality: Bilateral;  Rt fem/pop graft and Lt popliteal   . RIGHT/LEFT HEART CATH AND CORONARY/GRAFT ANGIOGRAPHY N/A 11/29/2019   Procedure: RIGHT/LEFT HEART CATH AND CORONARY/GRAFT ANGIOGRAPHY;  Surgeon: Troy Sine, MD;  Location: [facility];  Service: Cardiovascular;  Laterality: N/A;  . VASECTOMY  01/28/08    Allergies  Allergen Reactions  . Fentanyl   . Fentanyl And Related Other (See Comments)    Hallucinations    Current Outpatient Medications  Medication Sig Dispense Refill  . acetaminophen (TYLENOL) 500 MG tablet Take 1,000 mg by mouth every 6 (six) hours as needed for moderate pain or headache.     Marland Kitchen aspirin 81 MG tablet Take 81 mg by mouth daily.    . calcium carbonate (OS-CAL) 600 MG TABS Take 600 mg by mouth 2 (two) times daily with a meal.    . Cholecalciferol (VITAMIN D) 2000 units CAPS Take 2,000 Units by mouth daily.    . digoxin (LANOXIN) 0.125 MG tablet Take one tablet by mouth daily EXCEPT Do NOT take on Sunday. 90 tablet 3  . famotidine (PEPCID) 20 MG tablet TAKE 1 TABLET BY MOUTH AT BEDTIME AS DIRECTED 90 tablet 3  . folic acid (FOLVITE) 1 MG tablet Take 1 mg by mouth every evening.     . hydrochlorothiazide (MICROZIDE) 12.5 MG capsule Take 12.5 mg by mouth every other day.     . niacin (NIASPAN) 500 MG CR tablet Take 2,000  mg by mouth every evening.     Marland Kitchen oxyCODONE-acetaminophen (PERCOCET) 10-325 MG tablet Take 1 tablet by mouth 3 (three) times daily as needed for pain.     . pantoprazole (PROTONIX) 40 MG tablet TAKE 1 TABLET BY MOUTH ONCE DAILY 30 TO 60 MINUTES BEFORE FIRST MEAL OF THE DAY 90 tablet 3  . Pirfenidone (ESBRIET) 267 MG TABS Take 3 tablets (801 mg total) by mouth 3 (three) times daily. 810 tablet 5  . simvastatin (ZOCOR) 20 MG tablet Take 1 tablet (20 mg total) by mouth daily at 6 PM. 90 tablet 2  . warfarin (COUMADIN) 5 MG tablet Take 1 tablet by mouth daily or as directed by PCP 45 tablet 0   No current facility-administered medications for this visit.    Social History   Socioeconomic  History  . Marital status: Single    Spouse name: Not on file  . Number of children: Not on file  . Years of education: Not on file  . Highest education level: Not on file  Occupational History  . Not on file  Tobacco Use  . Smoking status: Former Smoker    Packs/day: 2.00    Years: 25.00    Pack years: 50.00    Types: Cigarettes    Quit date: 04/26/1986    Years since quitting: 34.0  . Smokeless tobacco: Never Used  Vaping Use  . Vaping Use: Never used  Substance and Sexual Activity  . Alcohol use: No    Alcohol/week: 0.0 standard drinks  . Drug use: No  . Sexual activity: Not on file  Other Topics Concern  . Not on file  Social History Narrative  . Not on file   Social Determinants of Health   Financial Resource Strain:   . Difficulty of Paying Living Expenses: Not on file  Food Insecurity:   . Worried About Charity fundraiser in the Last Year: Not on file  . Ran Out of Food in the Last Year: Not on file  Transportation Needs:   . Lack of Transportation (Medical): Not on file  . Lack of Transportation (Non-Medical): Not on file  Physical Activity:   . Days of Exercise per Week: Not on file  . Minutes of Exercise per Session: Not on file  Stress:   . Feeling of Stress : Not on file  Social Connections:   . Frequency of Communication with Friends and Family: Not on file  . Frequency of Social Gatherings with Friends and Family: Not on file  . Attends Religious Services: Not on file  . Active Member of Clubs or Organizations: Not on file  . Attends Archivist Meetings: Not on file  . Marital Status: Not on file  Intimate Partner Violence:   . Fear of Current or Ex-Partner: Not on file  . Emotionally Abused: Not on file  . Physically Abused: Not on file  . Sexually Abused: Not on file    Family History  Problem Relation Age of Onset  . Lung cancer Mother        smoked  . Heart failure Father   . Stroke Sister    Socially he is divorced has 3  children and 2 grandchildren. He is not routine exercise. No tobacco or alcohol use.  ROS General: Negative; No fevers, chills, or night sweats;  HEENT: Hearing aid in his right ear due to decreased hearing., sinus congestion, difficulty swallowing Pulmonary: Shortness of breath, positive for asbestosis and interstitial pulmonary fibrosis  Cardiovascular:  See HPI: No change in claudication GI: Negative; No nausea, vomiting, diarrhea, or abdominal pain GU: Negative; No dysuria, hematuria, or difficulty voiding Musculoskeletal: Negative; no myalgias, joint pain, or weakness Hematologic/Oncology: Negative; no easy bruising, bleeding Endocrine: Negative; no heat/cold intolerance; no diabetes Neuro: Occasional transient toe paresthesias; no changes in balance, headaches Skin: Negative; No rashes or skin lesions Psychiatric: Negative; No behavioral problems, depression Sleep: Positive for sleep apnea, currently untreated for the past 3-4 years.  Originally diagnosed in 2005, both obstructive and central events.  Positive for daytime sleepiness, hypersomnolence; no bruxism, restless legs, hypnogognic hallucinations, no cataplexy.  Had recently received a new machine from the [place]. Other comprehensive 14 point system review is negative.  PE BP (!) 117/57   Pulse (!) 56   Temp (!) 97 F (36.1 C)   Ht 5' 10.5" (1.791 m)   Wt 157 lb 9.6 oz (71.5 kg)   SpO2 (!) 85%   BMI 22.29 kg/m    Repeat blood pressure by me was 95/60 in the supine position in a wheelchair  Wt Readings from Last 3 Encounters:  04/18/20 157 lb 9.6 oz (71.5 kg)  04/13/20 159 lb 6.4 oz (72.3 kg)  03/16/20 159 lb 3.2 oz (72.2 kg)    Physical Exam BP (!) 117/57   Pulse (!) 56   Temp (!) 97 F (36.1 C)   Ht 5' 10.5" (1.791 m)   Wt 157 lb 9.6 oz (71.5 kg)   SpO2 (!) 85%   BMI 22.29 kg/m  General: Alert, oriented, no distress.  Skin: normal turgor, no rashes, warm and dry HEENT: Normocephalic, atraumatic. Pupils equal  round and reactive to light; sclera anicteric; extraocular muscles intact; Fundi ** Nose without nasal septal hypertrophy Mouth/Parynx benign; Mallinpatti scale Neck: No JVD, no carotid bruits; normal carotid upstroke Lungs: clear to ausculatation and percussion; no wheezing or rales Chest wall: without tenderness to palpitation Heart: PMI not displaced, RRR, s1 s2 normal, 2/6 mid-to-late peaking systolic murmur, no diastolic murmur, no rubs, gallops, thrills, or heaves Abdomen: soft, nontender; no hepatosplenomehaly, BS+; abdominal aorta nontender and not dilated by palpation. Back: no CVA tenderness Pulses 2+ Musculoskeletal: full range of motion, normal strength, no joint deformities Extremities: no clubbing cyanosis or edema, Homan's sign negative  Neurologic: grossly nonfocal; Cranial nerves grossly wnl Psychologic: Normal mood and affect    ECG (independently read by me): Atrial flutter at 56, RBBB, inferior infarct; no ectopy on 12 lead but PVcs. On rhythm strip  May 19,2021 ECG (independently read by me): Sinus tachycardia at 110 versus possible atrial flutter; PAC, RBBB; LAHB; PR 148 msec  October 25, 2019 ECG (independently read by me):Sinus tachycardia at 107;RBBB, no ectopy; 1st degree AV block with PR 225 msec; QTc 512 msec    February 15, 2021ECG (independently read by me): Sinus rhythm with PACs in an atrial bigeminal pattern  November 2020 ECG (independently read by me): Sinus rhythm with suggestion of intermittent second-degree Mobitz type I block.  Left axis deviation.  Nonspecific interventricular block.  QTc interval 435 ms.  September 2019 ECG (independently read by me): Sinus rhythm with sinus arrhythmia, isolated PVC.  Left anterior hemiblock.  Early transition with prominent R wave in V1 and V2  September 2018 ECG (independently read by me): sinus bradycardia 51 bpm.  Left axis deviation.  Inferior Q waves with early transition.  Normal intervals.  April 2017 ECG  (independently read by me): Normal sinus rhythm with PACs.  Heart rate 62  bpm.  Inferior posterior MI  November 2016 ECG (independently read by me):  Normal sinus rhythm at 62 bpm.   Incomplete right bundle branch block.  Prior ECG (independently read by me): Sinus bradycardia at 58 bpm with an isolate a PVC and occasional PACs with mild sinus arrhythmia.  Evidence for prior inferior posterior infarct.  Nonspecific ST changes.  Prior 11/05/2013 ECG (independently read by me): Sinus bradycardia at 49 beats per minute.  QTc interval 426 ms.  PR interval 174 ms.  Mild RV conduction delay  Prior 04/26/2013 ECG: Marked sinus bradycardia 43 beats per minute. Left axis deviation. 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  LABS:  BMP Latest Ref Rng & Units 03/16/2020 11/29/2019 11/29/2019  Glucose 70 - 99 mg/dL 96 - -  BUN 6 - 23 mg/dL 15 - -  Creatinine 0.40 - 1.50 mg/dL 1.20 - -  BUN/Creat Ratio 10 - 24 - - -  Sodium 135 - 145 mEq/L 138 143 144  Potassium 3.5 - 5.1 mEq/L 4.2 4.1 4.1  Chloride 96 - 112 mEq/L 101 - -  CO2 19 - 32 mEq/L 30 - -  Calcium 8.4 - 10.5 mg/dL 9.5 - -   Hepatic Function Latest Ref Rng & Units 04/13/2020 03/16/2020 11/24/2019  Total Protein 6.0 - 8.3 g/dL 7.5 7.6 8.0  Albumin 3.5 - 5.2 g/dL 4.0 4.3 4.4  AST 0 - 37 U/L _0 ALT 0 - 53 U/L _1 Alk Phosphatase 39 - 117 U/L 73 77 110  Total Bilirubin 0.2 - 1.2 mg/dL 0.8 0.5 0.5  Bilirubin, Direct 0.0 - 0.3 mg/dL 0.2 0.1 -   CBC Latest Ref Rng & Units 03/16/2020 11/29/2019 11/29/2019  WBC 4.0 - 10.5 K/uL 7.8 - -  Hemoglobin 13.0 - 17.0 g/dL 12.7(L) 13.6 13.6  Hematocrit 39 - 52 % 41.3 40.0 40.0  Platelets 150 - 400 K/uL 153.0 - -   Lab Results  Component Value Date   MCV 74.3 (L) 03/16/2020   MCV 71 (L) 11/24/2019   MCV 95.7 08/25/2017   No results found for: HGBA1C  Lab Results  Component Value Date   TSH 3.130 11/24/2019   Lipid Panel     Component Value Date/Time   CHOL 109  04/10/2018 0827   CHOL 118 05/05/2013 0839   TRIG 106 04/10/2018 0827   TRIG 202 (H) 05/05/2013 0839   HDL 45 04/10/2018 0827   HDL 38 (L) 05/05/2013 0839   CHOLHDL 2.4 04/10/2018 0827   CHOLHDL 2.6 05/20/2014 0823   VLDL 27 05/20/2014 0823   LDLCALC 43 04/10/2018 0827   LDLCALC 40 05/05/2013 0839     RADIOLOGY: No results found.  Cardiac Studies: The patient wore an event monitor from December 10 through June 30, 2019.  The predominant rhythm was sinus rhythm with an average rate at 65 bpm.  The slowest heart rate was sinus bradycardia at 47 bpm which occurred on December 14 at 12:50 AM.  The fastest heart beat was sinus tachycardia on December 15 which occurred at 8:23 AM.  The patient had several episodes of atrial fibrillation with multifocal PVCs, as well as atrial flutter with variable block with PVCs which were auto triggered. The  minimum heart rate in A. fib was 56 bpm with maximum heart rate 97 bpm.  There were no instances of heart block, prolonged pauses.  There was an isolated multifocal atrial couplet but no episodes of VT.   ECHO: 10/18/2019  MPRESSIONS  1. Left ventricular ejection fraction, by estimation, is 20 to 25%. The  left ventricle has severely decreased function. The left ventricle  demonstrates global hypokinesis. The left ventricular internal cavity size  was severely dilated. There is mild  left ventricular hypertrophy. Left ventricular diastolic parameters are  indeterminate.  2. Right ventricular systolic function is severely reduced. The right  ventricular size is severely enlarged. There is mildly elevated pulmonary  artery systolic pressure.  3. Right atrial size was mild to moderately dilated.  4. The mitral valve is abnormal. Mild mitral valve regurgitation.  5. AV is thickened, calcified with restricted motion Peak and mean  gradients through the vale are 26 and 12 mm Hg respectively. LVOT / AV VTI  ratio is 0.22 consistent with severe  AS. Marland Kitchen The aortic valve is abnormal.  Aortic valve regurgitation is not  visualized.  6. The inferior vena cava is normal in size with <50% respiratory  variability, suggesting right atrial pressure of 8 mmHg.  7. Compared to echo from October 2020, LVEF and RVEF are depressed.     Ramus lesion is 100% stenosed.  Mid Cx lesion is 80% stenosed.  Prox RCA to Mid RCA lesion is 100% stenosed.  Origin lesion is 100% stenosed.  Ost LAD lesion is 70% stenosed.  Prox LAD to Mid LAD lesion is 100% stenosed.   Severe native CAD with 70% ostial LAD stenosis with total occlusion after a proximal diagonal vessel; 80% stenosis in the circumflex vessel and total proximal native RCA occlusion.  Patent LIMA graft supplying a large marginal vessel of the circumflex with collateralization to the distal RCA PD PLA vessels.  Patent RIMA graft supplying the mid LAD.  Patent old vein graft supplying the diagonal branch of the LAD.  Occluded 2 vein grafts from the 2005 surgery and 2 vein grafts from the 1994 surgery.  Pulmonary arterial hypertension with mean PA pressure at 32 mm.  PVR 5.7 WU.  Mild aortic valve stenosis.  RECOMMENDATION: Will review with colleagues.  The patient's arterial conduits are patent and his distal RCA is well collateralized the vein graft from 1994 supplies the diagonal vessel.  The present catheterization does not suggest severe aortic stenosis will plan to repeat echo Doppler study.  The patient's progressive shortness of breath is multifactorial contributed by ischemia, idiopathic pulmonary fibrosis, and contributed by his aortic valve disease.       IMPRESSION: 1. CAD in native artery   2. Hx of CABG   3.  Aortic stenosis   4. HFrEF (heart failure with reduced ejection fraction) ([place])   5. IPF (idiopathic pulmonary fibrosis) ([place])   6. Atypical atrial flutter ([facility])   7. Status post percutaneous transluminal angioplasty (PTA) with stent placement   8.  Abdominal aortic aneurysm (AAA) without rupture [facility])     ASSESSMENT AND PLAN: Mr. Gaus is a 79 year old Caucasian male who has CAD dating back to 1994 when he underwent his initial CABG surgery and required redo bypass surgery in 2005. He has peripheral vascular disease.   His last nuclear study revealed inferior scar without associated ischemia. 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  He is status post remote right fem-tib bypass graft surgery in 1996. He is followed  by Dr. Trula Slade.  His most recent peripheral vascular Doppler studies which showed patent stents in the right proximal  SFA, right proximal SFA to peroneal  artery bypass graft as well as left proximal SFA to proximal TPT stent. A 2-week Holter monitor  demonstrated predominant sinus rhythm but he was found to have several episodes of atrial flutter and atrial fibrillation with PVCs.  Fastest A. fib rate was 97 bpm with longest duration at 21 minutes.  He is on warfarin anticoagulation.  When I saw him February 2020 was sent and medications were adjusted.  I again reviewed his most recent echo Doppler study which showed an EF reduction of 20 to 25% which raised concern for low gradient severe aortic stenosis.  He underwent right and left heart cardiac catheterization on Nov 29, 2019 which revealed severe native CAD and he had a patent LIMA graft supplying a large marginal vessel of the circumflex with collateralization to the distal RCA/PDA PLA vessels, a patent RIMA graft supplying the mid LAD, and a old patent vein graft supplying the diagonal branch of the LAD.  2 grafts from 2005 and 2 graft from 1994 surgery or occluded.  PVR was increased at 5.7 were Q consistent with his pulmonary arterial hypertension contributed by his idiopathic pulmonary fibrosis.  Mean PA pressure was 32 mm.   I was able to cross his aortic valve and his mean gradient calculated at 10.8 mm.  Subsequently, he has continued to be followed by Dr. Chase Caller.  He did develop a rash and transiently Esbriet was discontinued.  He is now reinitiating therapy.  He apparently had a follow-up echo Doppler study ordered by Roby Lofts on April 10, 2020 which again raise concern for low gradient AS and apparently he is scheduled to see Dr. Burt Knack for further evaluation later this month.  Presently, Mr. Bodie is without anginal symptoms.  He continues to be on chronic supplemental oxygen therapy for his idiopathic pulmonary fibrosis and has had recent follow-up CT imaging for his lung cancer history.  He has obstructive sleep apnea and has been on CPAP therapy in the past   Troy Sine, MD, [facility]  04/22/2020 1:16 PM

## 2020-04-18 NOTE — Patient Instructions (Signed)
Medication Instructions:  Start taking your hydrochlorothiazide EVERY OTHER DAY *If you need a refill on your cardiac medications before your next appointment, please call your pharmacy*   Lab Work: None ordered If you have labs (blood work) drawn today and your tests are completely normal, you will receive your results only by: Marland Kitchen MyChart Message (if you have MyChart) OR . A paper copy in the mail If you have any lab test that is abnormal or we need to change your treatment, we will call you to review the results.   Testing/Procedures: None ordered   Follow-Up: At [facility], you and your health needs are our priority.  As part of our continuing mission to provide you with exceptional heart care, we have created designated Provider Care Teams.  These Care Teams include your primary Cardiologist (physician) and Advanced Practice Providers (APPs -  Physician Assistants and Nurse Practitioners) who all work together to provide you with the care you need, when you need it.  We recommend signing up for the patient portal called "MyChart".  Sign up information is provided on this After Visit Summary.  MyChart is used to connect with patients for Virtual Visits (Telemedicine).  Patients are able to view lab/test results, encounter notes, upcoming appointments, etc.  Non-urgent messages can be sent to your provider as well.   To learn more about what you can do with MyChart, go to [email or web address].    Your next appointment:   4-5 month(s)  The format for your next appointment:   In Person  Provider:   Shelva Majestic, MD   Other Instructions None

## 2020-04-22 ENCOUNTER — Encounter: Payer: Self-pay | Admitting: Cardiovascular Disease

## 2020-04-24 ENCOUNTER — Ambulatory Visit: Payer: Medicare Other | Admitting: Cardiovascular Disease

## 2020-05-01 ENCOUNTER — Other Ambulatory Visit: Payer: Self-pay

## 2020-05-01 ENCOUNTER — Encounter: Payer: Self-pay | Admitting: Cardiovascular Disease

## 2020-05-01 ENCOUNTER — Ambulatory Visit (INDEPENDENT_AMBULATORY_CARE_PROVIDER_SITE_OTHER): Payer: Medicare Other | Admitting: Cardiovascular Disease

## 2020-05-01 VITALS — BP 108/60 | HR 68 | Ht 70.0 in | Wt 158.0 lb

## 2020-05-01 DIAGNOSIS — I350 Nonrheumatic aortic (valve) stenosis: Secondary | ICD-10-CM

## 2020-05-01 DIAGNOSIS — I255 Ischemic cardiomyopathy: Secondary | ICD-10-CM

## 2020-05-01 NOTE — Progress Notes (Signed)
Cardiology Office Note:    Date:  05/07/2020   ID:  Troy Miller, DOB 1941/01/11, MRN [id number]  PCP:  [name], PA-C  [facility] Cardiologist:  Shelva Majestic, MD  [facility] Electrophysiologist:  None   Referring MD: [name], PA-C   Chief Complaint  Patient presents with   Shortness of Breath    History of Present Illness:    Troy Miller is a 79 y.o. male referred by Dr. Claiborne Billings for evaluation of severe aortic stenosis.  The patient has a long, complex cardiovascular history.  He initially underwent multivessel CABG in 1994.  He later underwent redo CABG in 2005.  The patient has a history of atrial fibrillation, peripheral arterial disease with abdominal aortic and iliac aneurysms, right femoral tibial bypass, and chronic heart failure.  The patient has interstitial lung disease and he is oxygen dependent on 3 L of home O2.  He has had right lower lobe lung cancer treated with radiation therapy in the past.  The patient has had slowly progressive decline in his functional capacity over the past year and he has slowly lost weight.  In reviewing his weight trend today, he was approximately 210 pounds in 2018, 200 pounds in 2019, 180 pounds in 2020, and 158 pounds at the time of my evaluation today.  The patient was seen by Dr. Claiborne Billings this spring, and he was noted to have progressive shortness of breath.  His echocardiogram was suggestive of low flow low gradient aortic stenosis with severely calcified aortic valve leaflet and reduced leaflet excursion.  He was referred for right and left heart catheterization that demonstrated moderate pulmonary hypertension with a mean PA pressure of 32 and a PVR of 5.7 Wood units.  He was noted to have severe multivessel CAD with continued patency of the LIMA to OM graft, patency of the RIMA graft supplying the LAD, and patency of the saphenous vein graft to the diagonal.  His mean transaortic gradient measured directly in the  catheterization lab was only 11 mmHg.  His most recent echocardiogram showed an ejection fraction of 20 to 25%, severe RV dysfunction, a mean transaortic gradient of 19 mmHg with a peak gradient of 29 mmHg, and a calculated valve area of 1.1 cm.  The patient's dimensionless index is 0.19.  Stroke-volume index is 37.  The patient spends more time at rest and he is much less active than he has been in the past.  He has stable exertional dyspnea without chest pain or pressure.  He denies orthopnea or PND at present.  He denies lightheadedness or syncope.  Past Medical History:  Diagnosis Date   AAA (abdominal aortic aneurysm) ([place])    Aortic stenosis, mild    07/10/12 EF 50-55% on echo-mitral annular ca+ also   Atrial fibrillation ([place])    03/19/10 ablation-Dr. Taylor-successful   Atrial flutter ([place])    CAD (coronary artery disease)    DVT (deep venous thrombosis) ([place])    History of stress test    Myoview 8/16:  EF 47%, inferior scar, no ischemia, Intermediate risk (low EF)   Hyperlipemia    Hypertension    Iliac artery aneurysm (HCC)    PVD (peripheral vascular disease) ([place])    remote right fem-tib bypass graft sugery   S/P CABG (coronary artery bypass graft) 1994 & 2005    Past Surgical History:  Procedure Laterality Date   ABDOMINAL AORTOGRAM W/LOWER EXTREMITY N/A 02/18/2017   Procedure: ABDOMINAL AORTOGRAM W/LOWER EXTREMITY;  Surgeon:  Serafina Mitchell, MD;  Location: [place] [facility];  Service: Cardiovascular;  Laterality: N/A;   ABDOMINAL AORTOGRAM W/LOWER EXTREMITY N/A 05/05/2018   Procedure: ABDOMINAL AORTOGRAM W/LOWER EXTREMITY;  Surgeon: Serafina Mitchell, MD;  Location: [place] [facility];  Service: Cardiovascular;  Laterality: N/A;   BACK SURGERY  2003   CARDIAC ELECTROPHYSIOLOGY STUDY AND ABLATION  03/19/10   CARDIOVERSION  08/16/08   CHOLECYSTECTOMY  08/13/02   CORONARY ARTERY BYPASS GRAFT  1994 & 2005   FEMORAL-TIBIAL BYPASS GRAFT     Remote   HERNIA  REPAIR  06/03/07   PERIPHERAL VASCULAR INTERVENTION Left 02/18/2017   Procedure: PERIPHERAL VASCULAR INTERVENTION;  Surgeon: Serafina Mitchell, MD;  Location: [place] [facility];  Service: Cardiovascular;  Laterality: Left;  POPLITEAL   PERIPHERAL VASCULAR INTERVENTION Bilateral 05/05/2018   Procedure: PERIPHERAL VASCULAR INTERVENTION;  Surgeon: Serafina Mitchell, MD;  Location: [place] [facility];  Service: Cardiovascular;  Laterality: Bilateral;  Rt fem/pop graft and Lt popliteal   RIGHT/LEFT HEART CATH AND CORONARY/GRAFT ANGIOGRAPHY N/A 11/29/2019   Procedure: RIGHT/LEFT HEART CATH AND CORONARY/GRAFT ANGIOGRAPHY;  Surgeon: Troy Sine, MD;  Location: [place] [facility];  Service: Cardiovascular;  Laterality: N/A;   VASECTOMY  01/28/08    Current Medications: Current Meds  Medication Sig   acetaminophen (TYLENOL) 500 MG tablet Take 1,000 mg by mouth every 6 (six) hours as needed for moderate pain or headache.    aspirin 81 MG tablet Take 81 mg by mouth daily.   calcium carbonate (OS-CAL) 600 MG TABS Take 600 mg by mouth 2 (two) times daily with a meal.   Cholecalciferol (VITAMIN D) 2000 units CAPS Take 2,000 Units by mouth daily.   digoxin (LANOXIN) 0.125 MG tablet Take one tablet by mouth daily EXCEPT Do NOT take on Sunday.   famotidine (PEPCID) 20 MG tablet TAKE 1 TABLET BY MOUTH AT BEDTIME AS DIRECTED   folic acid (FOLVITE) 1 MG tablet Take 1 mg by mouth every evening.    hydrochlorothiazide (MICROZIDE) 12.5 MG capsule Take 12.5 mg by mouth every other day.    niacin (NIASPAN) 500 MG CR tablet Take 2,000 mg by mouth every evening.    oxyCODONE-acetaminophen (PERCOCET) 10-325 MG tablet Take 1 tablet by mouth 3 (three) times daily as needed for pain.    pantoprazole (PROTONIX) 40 MG tablet TAKE 1 TABLET BY MOUTH ONCE DAILY 30 TO 60 MINUTES BEFORE FIRST MEAL OF THE DAY   Pirfenidone (ESBRIET) 267 MG TABS Take 3 tablets (801 mg total) by mouth 3 (three) times daily.    simvastatin (ZOCOR) 20 MG tablet Take 1 tablet (20 mg total) by mouth daily at 6 PM.   warfarin (COUMADIN) 5 MG tablet Take 1 tablet by mouth daily or as directed by PCP     Allergies:   Fentanyl and Fentanyl and related   Social History   Socioeconomic History   Marital status: Single    Spouse name: Not on file   Number of children: Not on file   Years of education: Not on file   Highest education level: Not on file  Occupational History   Not on file  Tobacco Use   Smoking status: Former Smoker    Packs/day: 2.00    Years: 25.00    Pack years: 50.00    Types: Cigarettes    Quit date: 04/26/1986    Years since quitting: 34.0   Smokeless tobacco: Never Used  Vaping Use   Vaping Use:  Never used  Substance and Sexual Activity   Alcohol use: No    Alcohol/week: 0.0 standard drinks   Drug use: No   Sexual activity: Not on file  Other Topics Concern   Not on file  Social History Narrative   Not on file   Social Determinants of Health   Financial Resource Strain:    Difficulty of Paying Living Expenses: Not on file  Food Insecurity:    Worried About [place] in the Last Year: Not on file   Ran Out of Food in the Last Year: Not on file  Transportation Needs:    Lack of Transportation (Medical): Not on file   Lack of Transportation (Non-Medical): Not on file  Physical Activity:    Days of Exercise per Week: Not on file   Minutes of Exercise per Session: Not on file  Stress:    Feeling of Stress : Not on file  Social Connections:    Frequency of Communication with Friends and Family: Not on file   Frequency of Social Gatherings with Friends and Family: Not on file   Attends Religious Services: Not on file   Active Member of Clubs or Organizations: Not on file   Attends Archivist Meetings: Not on file   Marital Status: Not on file     Family History: The patient's family history includes Heart failure in his  father; Lung cancer in his mother; Stroke in his sister.  ROS:   Please see the history of present illness.    All other systems reviewed and are negative.  EKGs/Labs/Other Studies Reviewed:    The following studies were reviewed today: Cardiac Catheterization 11-29-2019: Conclusion    Ramus lesion is 100% stenosed.  Mid Cx lesion is 80% stenosed.  Prox RCA to Mid RCA lesion is 100% stenosed.  Origin lesion is 100% stenosed.  Ost LAD lesion is 70% stenosed.  Prox LAD to Mid LAD lesion is 100% stenosed.   Severe native CAD with 70% ostial LAD stenosis with total occlusion after a proximal diagonal vessel; 80% stenosis in the circumflex vessel and total proximal native RCA occlusion.  Patent LIMA graft supplying a large marginal vessel of the circumflex with collateralization to the distal RCA PD PLA vessels.  Patent RIMA graft supplying the mid LAD.  Patent old vein graft supplying the diagonal branch of the LAD.  Occluded 2 vein grafts from the 2005 surgery and 2 vein grafts from the 1994 surgery.  Pulmonary arterial hypertension with mean PA pressure at 32 mm.  PVR 5.7 WU.  Mild aortic valve stenosis.  RECOMMENDATION: Will review with colleagues.  The patient's arterial conduits are patent and his distal RCA is well collateralized the vein graft from 1994 supplies the diagonal vessel.  The present catheterization does not suggest severe aortic stenosis will plan to repeat echo Doppler study.  The patient's progressive shortness of breath is multifactorial contributed by ischemia, idiopathic pulmonary fibrosis, and contributed by his aortic valve disease.  Diagnostic Dominance: Right Left Anterior Descending  Ost LAD lesion is 70% stenosed.  Prox LAD to Mid LAD lesion is 100% stenosed.  Ramus Intermedius  Ramus lesion is 100% stenosed.  Left Circumflex  Mid Cx lesion is 80% stenosed.  Right Coronary Artery  Prox RCA to Mid RCA lesion is 100% stenosed.   Third Right Posterolateral Branch  Collaterals  3rd RPL filled by collaterals from 2nd Mrg.    Collaterals  3rd RPL filled by collaterals from Ramus.  LIMA Graft To Ramus  RIMA Graft To Mid LAD  Graft To RPDA  Origin lesion is 100% stenosed.  Graft To 1st Diag  Intervention  No interventions have been documented. Right Heart  Right Atrium RA: A-wave 4, V wave 7; Mean 3 RV: 45/1 PA:  53/21; mean 32 PW: A and V wave 8, mean 7  Initial AO: 118/75  LV 130/7 PW: 8  Pullback: LV: 130/8 AO: 124/68  Cardiac output by the thermodilution method was 4.3 and by the Fick method was 4.4 L/min. By the thermodilution method was 2.2 and by the Fick method was 2.2 L/min/m.  Aortic valve mean gradient 10.8; aortic valve area 2.1 cm.  PVR: 5.7 WU  Coronary Diagrams  Diagnostic Dominance: Right  Intervention  Implants   No implant documentation for this case.  Syngo Images  Show images for CARDIAC CATHETERIZATION Images on Long Term Storage  Show images for Jump, PIETRO BONURA to Procedure Log  Procedure Log    Hemo Data   Most Recent Value  Fick Cardiac Output 4.39 L/min  Fick Cardiac Output Index 2.21 (L/min)/BSA  Thermal Cardiac Output 4.33 L/min  Thermal Cardiac Output Index 2.18 (L/min)/BSA  Aortic Mean Gradient 10.8 mmHg  Aortic Peak Gradient 7 mmHg  Aortic Valve Area 2.09  Aortic Value Area Index 1.05 cm2/BSA  Mitral Mean Gradient 5 mmHg  Mitral Peak Gradient 1 mmHg  Mitral Valve Area Index 1.67 cm2/BSA  RA A Wave 4 mmHg  RA V Wave 7 mmHg  RA Mean 3 mmHg  RV Systolic Pressure 45 mmHg  RV Diastolic Pressure -1 mmHg  RV EDP 1 mmHg  PA Systolic Pressure 51 mmHg  PA Diastolic Pressure 20 mmHg  PA Mean 29 mmHg  PW A Wave 11 mmHg  PW V Wave 9 mmHg  PW Mean 8 mmHg  AO Systolic Pressure 017 mmHg  AO Diastolic Pressure 72 mmHg  AO Mean 90 mmHg  LV Systolic Pressure 510 mmHg  LV Diastolic Pressure 2 mmHg  LV EDP 7 mmHg  AOp Systolic Pressure  258 mmHg  AOp Diastolic Pressure 68 mmHg  AOp Mean Pressure 87 mmHg  LVp Systolic Pressure 527 mmHg  LVp Diastolic Pressure 2 mmHg  LVp EDP Pressure 8 mmHg  TPVR Index 14.69 HRUI  TSVR Index 43.62 HRUI  PVR SVR Ratio 0.27  TPVR/TSVR Ratio 0.34   Echo 04-10-2020: IMPRESSIONS    1. Left ventricular ejection fraction, by estimation, is 20 to 25%. The  left ventricle has severely decreased function. The left ventricle  demonstrates global hypokinesis. The left ventricular internal cavity size  was mildly dilated. Left ventricular  diastolic parameters are consistent with Grade I diastolic dysfunction  (impaired relaxation). Elevated left ventricular end-diastolic pressure.  2. Right ventricular systolic function is severely reduced. The right  ventricular size is normal. There is moderately elevated pulmonary artery  systolic pressure.  3. Left atrial size was moderately dilated.  4. Right atrial size was moderately dilated.  5. The mitral valve is normal in structure. Trivial mitral valve  regurgitation. No evidence of mitral stenosis.  6. Based on peak gradient and mean velocity, the aortic stenosis is mild.  However visually, it appears to be severe. Likely low flow-low gradient  aortic stenosis.. The aortic valve is normal in structure. There is severe  calcifcation of the aortic  valve. There is severe thickening of the aortic valve. Aortic valve  regurgitation is not visualized. No aortic stenosis is present. Aortic  valve area,  by VTI measures 1.11 cm. Aortic valve mean gradient measures  19.0 mmHg. Aortic valve Vmax measures  2.71 m/s.  7. The inferior vena cava is dilated in size with <50% respiratory  variability, suggesting right atrial pressure of 15 mmHg.   LEFT VENTRICLE  PLAX 2D  LVIDd:     5.80 cm Diastology  LVIDs:     5.20 cm LV e' medial:  3.48 cm/s  LV PW:     0.70 cm LV E/e' medial: 16.3  LV IVS:    0.60 cm LV e' lateral:   8.27 cm/s  LVOT diam:   2.70 cm LV E/e' lateral: 6.8  LV SV:     69  LV SV Index:  37  LVOT Area:   5.73 cm               3D Volume EF:             3D EF:    28 %             LV EDV:    115 ml             LV ESV:    83 ml             LV SV:    32 ml   RIGHT VENTRICLE  RV Basal diam: 5.00 cm  RV S prime:   6.42 cm/s  TAPSE (M-mode): 1.5 cm  RVSP:      39.6 mmHg   LEFT ATRIUM       Index    RIGHT ATRIUM      Index  LA diam:    4.40 cm 2.32 cm/m RA Pressure: 8.00 mmHg  LA Vol (A2C):  39.8 ml 21.01 ml/m RA Area:   21.80 cm  LA Vol (A4C):  44.3 ml 23.38 ml/m RA Volume:  69.60 ml 36.74 ml/m  LA Biplane Vol: 43.4 ml 22.91 ml/m  AORTIC VALVE  AV Area (Vmax):  1.15 cm  AV Area (Vmean):  1.04 cm  AV Area (VTI):   1.11 cm  AV Vmax:      271.00 cm/s  AV Vmean:     204.000 cm/s  AV VTI:      0.626 m  AV Peak Grad:   29.4 mmHg  AV Mean Grad:   19.0 mmHg  LVOT Vmax:     54.20 cm/s  LVOT Vmean:    37.100 cm/s  LVOT VTI:     0.121 m  LVOT/AV VTI ratio: 0.19    AORTA  Ao Root diam: 4.20 cm  Ao Asc diam: 3.30 cm   MITRAL VALVE        TRICUSPID VALVE  MV Area (PHT):       TR Peak grad:  31.6 mmHg  MV Decel Time:       TR Vmax:    281.00 cm/s  MV E velocity: 56.60 cm/s Estimated RAP: 8.00 mmHg  MV A velocity: 75.40 cm/s RVSP:      39.6 mmHg  MV E/A ratio: 0.75               SHUNTS               Systemic VTI: 0.12 m               Systemic Diam: 2.70 cm   EKG:  EKG is not ordered today.   Recent Labs: 11/24/2019: TSH 3.130 03/16/2020: Hemoglobin 12.7; Platelets 153.0 04/13/2020: ALT 11 05/01/2020:  BUN 17; Creatinine, Ser 1.14; Potassium 4.5; Sodium 141  Recent Lipid Panel    Component Value Date/Time   CHOL 109 04/10/2018 0827   CHOL  118 05/05/2013 0839   TRIG 106 04/10/2018 0827   TRIG 202 (H) 05/05/2013 0839   HDL 45 04/10/2018 0827   HDL 38 (L) 05/05/2013 0839   CHOLHDL 2.4 04/10/2018 0827   CHOLHDL 2.6 05/20/2014 0823   VLDL 27 05/20/2014 0823   LDLCALC 43 04/10/2018 0827   LDLCALC 40 05/05/2013 0839     Risk Assessment/Calculations:     CHA2DS2-VASc Score = 5  This indicates a 7.2% annual risk of stroke. The patient's score is based upon: CHF History: 1 HTN History: 1 Diabetes History: 0 Stroke History: 0 Vascular Disease History: 1 Age Score: 2 Gender Score: 0      Physical Exam:    VS:  BP 108/60    Pulse 68    Ht _0  (1.778 m)    Wt 158 lb (71.7 kg)    SpO2 93%    BMI 22.67 kg/m     Wt Readings from Last 3 Encounters:  05/01/20 158 lb (71.7 kg)  04/18/20 157 lb 9.6 oz (71.5 kg)  04/13/20 159 lb 6.4 oz (72.3 kg)     GEN:  Elderly, frail appearing male in no acute distress HEENT: Normal NECK: No JVD; BL carotid bruits LYMPHATICS: No lymphadenopathy CARDIAC: irregular, 2/6 harsh crescendo decrescendo murmur at the RUSB, A2 diminished RESPIRATORY:  Clear to auscultation without rales, wheezing or rhonchi  ABDOMEN: Soft, non-tender, non-distended MUSCULOSKELETAL:  No edema; No deformity  SKIN: Warm and dry NEUROLOGIC:  Alert and oriented x 3 PSYCHIATRIC:  Normal affect   STS Risk Calculator: Isolated AVR: Risk of Mortality: 11.099% Renal Failure: 5.932% Permanent Stroke: 3.503% Prolonged Ventilation: 33.745% DSW Infection: 0.309% Reoperation: 5.788% Morbidity or Mortality: 39.014% Short Length of Stay: 7.578% Long Length of Stay: 29.056%   ASSESSMENT:    1. Aortic valve stenosis, etiology of cardiac valve disease unspecified    PLAN:    In order of problems listed above:  84. 79 year old gentleman who has developed severe, stage D2 aortic stenosis in the context of severe ischemic cardiomyopathy with LVEF less than 30%.  He has New York Heart Association  functional class III symptoms.  He has severe comorbid conditions outlined above.  Most importantly, these include interstitial lung disease on home oxygen, weight loss of greater than 60 pounds over the last 3 years, poor functional capacity, peripheral arterial disease with known aortoiliac aneurysms and history of lower extremity bypass, and 2 prior coronary bypass surgeries.  I have reviewed the natural history of severe aortic stenosis with the patient and his family members today.  We discussed potential treatment options which would include palliative medical therapy, transcatheter aortic valve replacement, and surgical aortic valve replacement.  There is no question that he would not be a candidate for cardiac surgery.  He understands that even TAVR would come at high risk considering his severe comorbidities.  He wishes to explore potential treatment options.  I have recommended that we obtain a gated cardiac CTA study to assess anatomic considerations around TAVR.  I have also recommended a CTA of the chest, abdomen, and pelvis to evaluate his access for TAVR.  With his significant PAD, he may not have suitable transfemoral access.  If that is the case, palliative medical therapy may be indicated as I would be reluctant to consider him for alternative access TAVR under  general anesthesia.  All of this is discussed with the patient today.  Once his CT studies are obtained, we will review his case as a multidisciplinary heart team and if he is felt to be a potential TAVR candidate, he will be referred for formal cardiac surgical consultation.   Medication Adjustments/Labs and Tests Ordered: Current medicines are reviewed at length with the patient today.  Concerns regarding medicines are outlined above.  Orders Placed This Encounter  Procedures   Basic metabolic panel   No orders of the defined types were placed in this encounter.   Patient Instructions  Medication Instructions:  Your provider  recommends that you continue on your current medications as directed. Please refer to the Current Medication list given to you today.   *If you need a refill on your cardiac medications before your next appointment, please call your pharmacy*  Lab Work: TODAY! BMET If you have labs (blood work) drawn today and your tests are completely normal, you will receive your results only by:  [place] (if you have MyChart) OR  A paper copy in the mail If you have any lab test that is abnormal or we need to change your treatment, we will call you to review the results.  Follow-Up: See attached sheet for future testing and follow-up appointments.     Signed, Sherren Mocha, MD  05/07/2020 12:16 PM    [place]

## 2020-05-01 NOTE — Patient Instructions (Signed)
Medication Instructions:  Your provider recommends that you continue on your current medications as directed. Please refer to the Current Medication list given to you today.   *If you need a refill on your cardiac medications before your next appointment, please call your pharmacy*  Lab Work: TODAY! BMET If you have labs (blood work) drawn today and your tests are completely normal, you will receive your results only by: [place] [facility] MyChart Message (if you have MyChart) OR . A paper copy in the mail If you have any lab test that is abnormal or we need to change your treatment, we will call you to review the results.  Follow-Up: See attached sheet for future testing and follow-up appointments.

## 2020-05-02 LAB — BASIC METABOLIC PANEL
BUN/Creatinine Ratio: 15 (ref 10–24)
BUN: 17 mg/dL (ref 8–27)
CO2: 22 mmol/L (ref 20–29)
Calcium: 9.2 mg/dL (ref 8.6–10.2)
Chloride: 105 mmol/L (ref 96–106)
Creatinine, Ser: 1.14 mg/dL (ref 0.76–1.27)
GFR calc Af Amer: 69 mL/min/{1.73_m2} (ref >59)
GFR calc non Af Amer: 60 mL/min/{1.73_m2} (ref >59)
Glucose: 96 mg/dL (ref 65–99)
Potassium: 4.5 mmol/L (ref 3.5–5.2)
Sodium: 141 mmol/L (ref 134–144)

## 2020-05-07 ENCOUNTER — Encounter: Payer: Self-pay | Admitting: Cardiovascular Disease

## 2020-05-08 ENCOUNTER — Ambulatory Visit (HOSPITAL_COMMUNITY)
Admission: RE | Admit: 2020-05-08 | Discharge: 2020-05-08 | Disposition: A | Discharge status: Home / Self Care | Payer: Medicare Other | Source: Ambulatory Visit | Attending: Cardiovascular Disease | Admitting: Cardiovascular Disease

## 2020-05-08 ENCOUNTER — Other Ambulatory Visit: Payer: Self-pay

## 2020-05-08 ENCOUNTER — Ambulatory Visit (HOSPITAL_BASED_OUTPATIENT_CLINIC_OR_DEPARTMENT_OTHER)
Admission: RE | Admit: 2020-05-08 | Discharge: 2020-05-08 | Disposition: A | Discharge status: Home / Self Care | Payer: Medicare Other | Source: Ambulatory Visit | Attending: Cardiovascular Disease | Admitting: Cardiovascular Disease

## 2020-05-08 DIAGNOSIS — I350 Nonrheumatic aortic (valve) stenosis: Secondary | ICD-10-CM | POA: Insufficient documentation

## 2020-05-08 MED ORDER — IOHEXOL 350 MG/ML SOLN
100.0000 mL | Freq: Once | INTRAVENOUS | Status: AC | PRN
  Administered 2020-05-08: 100 mL via INTRAVENOUS

## 2020-05-08 NOTE — Progress Notes (Signed)
VASCULAR LAB    Carotid duplex has been performed.  See CV proc for preliminary results.   Natia Fahmy, RVT 05/08/2020, 9:23 AM

## 2020-05-09 ENCOUNTER — Telehealth: Payer: Self-pay

## 2020-05-09 NOTE — Telephone Encounter (Signed)
  [facility]   MULTIDISCIPLINARY HEART VALVE TEAM  Pt undergoing evaluation for TAVR, CT scans performed 11/1.  Multidisciplinary Structural Heart Team reviewed CT scans this morning to determine if pt has TF access for TAVR.  CT shows aneurysmal dilatation of the common iliac arteries bilaterally and fusiform aneurysmal dilatation of the infrarenal abdominal aorta.  The team felt like vascular anatomy was not favorable for TF access.  The pt's pending TAVR Consult with Dr Cyndia Bent has been cancelled and the pt has been made aware of Multidisciplinary team recommendation.  I advised the pt that Dr Burt Knack will also contact him by phone to further discuss findings.

## 2020-05-10 ENCOUNTER — Encounter: Payer: Medicare Other | Admitting: Thoracic Surgery (Cardiothoracic Vascular Surgery)

## 2020-05-10 ENCOUNTER — Encounter: Payer: Medicare Other | Admitting: Surgery

## 2020-05-12 ENCOUNTER — Ambulatory Visit: Payer: Medicare Other | Admitting: Physical Therapy

## 2020-05-12 NOTE — Telephone Encounter (Signed)
Will discuss with Dr. Burt Knack

## 2020-05-14 NOTE — Telephone Encounter (Signed)
I had a lengthy discussion with the patient and his friend who was present at the time of his office visit.  Our multidisciplinary heart team has reviewed the patient's case.  We feel that he does not have safe transfemoral access based on the presence of aortoiliac aneurysmal disease and heavy plaque burden in the distal abdominal aorta.  As previously documented, I think he would be at very high risk of undergoing alternative access TAVR.  We favor ongoing medical therapy without aortic valve intervention in the setting of his severe comorbid conditions outlined in my recent consultation note.  The patient understands and is appreciative of our evaluation.  He will continue to follow with Dr. Claiborne Billings.  All of his questions are answered this morning.

## 2020-05-16 NOTE — Telephone Encounter (Signed)
Thanks for your review and agree with your recommendations

## 2020-08-22 ENCOUNTER — Ambulatory Visit: Payer: Medicare Other | Admitting: Internal Medicine

## 2020-08-24 ENCOUNTER — Ambulatory Visit: Payer: Medicare Other | Admitting: Cardiovascular Disease

## 2020-08-31 ENCOUNTER — Ambulatory Visit (INDEPENDENT_AMBULATORY_CARE_PROVIDER_SITE_OTHER): Payer: Medicare Other | Admitting: Internal Medicine

## 2020-08-31 ENCOUNTER — Encounter: Payer: Self-pay | Admitting: Internal Medicine

## 2020-08-31 ENCOUNTER — Other Ambulatory Visit: Payer: Self-pay

## 2020-08-31 VITALS — BP 112/72 | HR 89 | Temp 98.0°F | Ht 70.5 in | Wt 157.4 lb

## 2020-08-31 DIAGNOSIS — I2723 Pulmonary hypertension due to lung diseases and hypoxia: Secondary | ICD-10-CM

## 2020-08-31 DIAGNOSIS — J9611 Chronic respiratory failure with hypoxia: Secondary | ICD-10-CM | POA: Diagnosis not present

## 2020-08-31 DIAGNOSIS — Z5181 Encounter for therapeutic drug level monitoring: Secondary | ICD-10-CM | POA: Diagnosis not present

## 2020-08-31 DIAGNOSIS — J84112 Idiopathic pulmonary fibrosis: Secondary | ICD-10-CM

## 2020-08-31 DIAGNOSIS — C3431 Malignant neoplasm of lower lobe, right bronchus or lung: Secondary | ICD-10-CM | POA: Diagnosis not present

## 2020-08-31 NOTE — Patient Instructions (Addendum)
ICD-10-CM   1. Chronic respiratory failure with hypoxia (HCC)  J96.11   2. IPF (idiopathic pulmonary fibrosis) ([place])  J84.112   3. Therapeutic drug monitoring  Z51.81   4. Malignant neoplasm of lower lobe of right lung (HCC)  C34.31   5. WHO group 3 pulmonary arterial hypertension (HCC)  I27.23      -Clinically IPF likely  worse over time  -Glad you are tolerating pirfenidone and I think it is helping to the extent possible -  You  also pulmonary hypertension related to pulmonary fibrosis based on May 2021 heart catheterization -Overall shortness of breath and fatigue is related to your heart and lung conditions  plan -Check liver function test today   -Continue oxygen -3 L at rest but just to get the pulse ox greater than 88% -Continue pirfenidone as per schedule -Continue to be active to the extent possible -I looked into inhaled treprostinil for your pulmonary hypertension but  because of a weak heart you do not qualify for this -I looked into inhaled nitric oxide research protocol but because of your weak heart you do not qualify for that [the aim of the study was to help with improved mobility and shortness of breath] -Too weak to do pulmonary function testing -Do high-resolution CT chest without contrast in 6 months   Lung Cancer Hx   - do HRCT without contrast in 6 months  . Follow-up - 6 months or sooner with Dr. Chase Caller in a 30-minute visit; return sooner if needed

## 2020-08-31 NOTE — Progress Notes (Signed)
OV 05/12/2017  Chief Complaint  Patient presents with  . Follow-up    Pt states that he is doing good. C/o SOB with exertion, occ. cough with clear to white mucus. Denies any CP.   Follow-up idiopathic pulmonary fibrosis started on Pirfenidone (Esbriet) and June 2018.  Follow left lower lobe lung nodule 8 mm in February 2018   He is here for routine follow-up. Last visit he did see my colleague and noticed some increased shortness of breath but currently this is settled. He is on full dose Pirfenidone (Esbriet) and is tolerating it well without any problems. He is interested in enrolling into the 1 pill format. He denies any worsening shortness of breath or cough that he does have shortness of breath with exertion relieved by rest but this is stable. His most recent liver function test was in September 2018 and normal. His creatinine was slightly elevated at 1.2 mg percent at that time. He is interested in research protocols in the future. He lives one hour away and it would be easier for him to have liver function tests monitoring locally he's not had a follow-up CT scan of the chest for his left lower lobe nodule noted in August 2080 needed have a CT angiogram that showed his lung nodule is stable compared to February 2018.    OV 08/01/2017  Chief Complaint  Patient presents with  . Follow-up    HRCT done 11/13, PET scan done 12/12, and PFT done today. Pt is currently on Esbriet. Pt states he has been doing about the same as last visit.  Occ. cough, SOB with exertion. Denies any CP.     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 Followup RLL lung nodule  In terms of his IPF: He is doing well clinically.  He says he is stable.  However when we walked him he desaturated on the second lap.  In fact pulmonary function test shows a decline.  But he is not feeling it.  He does have portable oxygen with him at home and he does not  use it.  Liver function tests in January 2019 on the seventh was normal.  He did this outside with his primary care physician and he brought the results with me for review.  At this point in time he finished 6 months of liver function test.  After this he will have liver function test every 3 months  Lung nodule: His CT scan recently showed right lower lobe lung nodule.  He had a follow-up PET scan and this shows the lung nodule to be PET hot.  I visualized the scan with Dr. Baltazar Apo and it is too far for bronchoscopy means.  The pretest probability for this being early stage non-small cell lung cancer is extremely high over 95% in my view.    Walking desaturation test on 08/01/2017 185 feet x 3 laps on ROOM AIR:  did YES desaturate. Rest pulse ox was 98%, final pulse ox was 85 at% at 2nd lap and stopped and started on 2L [place] an stayed at 96% for remainder. HR response 50/min at rest to 90 /min at peak exertion at 2nd lap when desaturated. Patient Artis A Mcevoy  Yes did Desaturate < 88% . Talon A Batte yes  Desaturated </= 3% points. Royer A Cecena yes did get tachyardic    OV 11/28/2017    Chief Complaint  Patient presents with  . Acute Visit  Pt has c/o SOB and cough with yellow mucus. Pt started on pred taper and abx 5/22.Pt states he hs had some indigestion after taking the doxy. Pt states since started on doxy and pred, SOB has improved. Pt is also still coughing but states mucus is turning clear now.   Shakeel A Corney  presents for follow-up of his idiopathic pulmonary fibrosis and therapeutic monitoring.  He is on Esbriet.  As of January 2019 he was having slow decline in lung function.  But he was tolerating the Pirfenidone (Esbriet) just fine.  At that time he had a PET heart right lower lobe nodule and I referred him to oncology.  Review of the notes indicate that he had 3 settings of radiation therapy and he was doing well after that.  He has a follow-up pending with  radiation oncology.  On Nov 26, 2017 he called our office with acute bronchitis exacerbation and we gave him doxycycline and prednisone.  He is midway through it and is beginning to feel a lot better and is close to baseline.  He did have some nausea and stomach irritation with the doxycycline.  Review of the medication list shows that he is on Niaspan for his hyperlipidemia and also Pirfenidone (Esbriet) for his IPF both of which are prone for GI side effects although at baseline he is never had any problems with those.  At this point in time he is overall feeling good.  He uses oxygen at night and periodically on a subjective basis with exertion   OV 12/30/2017  Chief Complaint  Patient presents with  . Follow-up    Pt states he is about the same as he was at last visit. Pt still becomes SOB, has cough with mostly clear mucus, and sometimes has occ. CP/chest tightness.   Gerda Diss Villamor , 80 y.o. , with dob 09-Feb-1941 and male ,Not Hispanic or Latino from [place] [address] - presents to ILD  clinic for IPF followup on Esbreit  In terms of IPF: He is overall stable. He tells me that he takes his aspirin regularly. Does not much of nausea. He is tolerating it fine. He apply sunscreen. Uses oxygen at night. He uses oxygen only at day time with exertion but on a subjective basis even though he desaturated quickly at the end of second lap in the office is documented below which is similar to before. His last liver function was May 2019 in stable. I personally review that result with a normal CK of 55 and AST are 16 and ALT of 12.  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 Radiation was in spring 2019. He is now having for the last several weeks to a few months new onset right lower lobe chest pain that is musculoskeletal. It happens randomly in the daytime particularly when he moves but he is not associating it with a  body movement is no radiation. The symptoms are mild to moderate in intensity. Is no clear cut aggravating or relieving factors. It is not present at night when he sleeping. This no weight loss or hemoptysis. Last imaging was in January 2019 at the time of diagnosis of cancer. His follow-up with radiation oncology on review of the chartshows its on 01/26/2018.     OV 03/31/2018  Subjective:  Patient ID: Vivi Ferns, male , DOB: 12-04-1940 , age 70 y.o. , MRN: [id number] , ADDRESS: [address] [place] [address]   03/31/2018 -   Chief Complaint  Patient presents with  . Follow-up    PFT performed today. Pt still becomes SOB when he exerts himself, has occ cough with clear to yellow mucus. Denies any CP.     - Follow-up idiopathic pulmonary fibrosis started on Pirfenidone (Esbriet)  - sinceeMay/June 2018.  - [place] RLL lung cancer - s/p XRT Feb 2019 - CT June 2019 - withj 50m LLL new nodule  HPI JMarisolA Mcleary 80y.o. -follows up IPF.  He is on Esbriet and is tolerating this well.  Although for the last several months he tells me that he had nonspecific itching that is moderate in severity.  He is wondering if it is because of Pirfenidone (Esbriet) but he is on many medications.  He does have dry skin.  He does not use Vaseline.  He continues to be on Niaspan for hyperlipidemia along with statin.  He has an upcoming cardiology appointment.  In terms of his IPF he feels stable.  His walking desaturation test is stable.  His lung function shows a mild decline but then looking at the flow volume loop it appears he has coughed significantly.  He does use oxygen as needed with exertion but is not fully regular with it.  He does not have any other GI side effects or sound side effects of Pirfenidone (Esbriet).  He does apply sunscreen regularly.  There is no rash anywhere.  New issue: He had a CT scan June 2019 and during post radiation surveillance a 5 mm left lower lobe nodule was  discovered this is new.   OV 10/21/2019 - telephone visit, 2 person identifier used, Risk benefit limitation of tele visit  Subjective:  Patient ID: JVivi Ferns male , DOB: 7Mar 27, 1942, age 80y.o. , MRN: [id number], [place] [id number]   - Follow-up idiopathic pulmonary fibrosis started on Pirfenidone ([place]  - sinceeMay/June 2018.  - [place] RLL lung cancer - s/p XRT Feb 2019 - CT June 2019 - withj 565mLLL new nodule  10/21/2019 -  Telephone visit  HPI JaKitt Ledetunsucker [age].o. -on this telephone visit reports that he slowly getting worse in terms of dyspnea.  He uses oxygen with exertion and at night but not at rest.  Otherwise he is feeling fine.  He tolerates his Esbriet fine.  He had a high-resolution CT chest that shows progression.  I personally last saw him in 2019.  In the interim he saw a nuDesigner, jewellery PFTs also show progression.  OV JUne 2021    12/22/2019 Follow up ; ILD, chronic respiratory failure, lung cancer Patient presents for a 2-17-monthllow-up .  Patient has underlying idiopathic pulmonary fibrosis is on Esbriet 3 capsules 3 times daily.  Patient says overall he continues to get very short of breath with minimal activity.  He tries to be active but wears out easily.  Says he has been dealing with tachycardia recently.  And has recently started digoxin.  He is followed by cardiology for cardiomyopathy and A. Fib.  He remains on Coumadin. Patient underwent a recent cardiac cath that showed 2 downed grafts with patent LIMA/RIMA with good collateralization.  No intervention was recommended. Cardiology notes indicate referral to EP and possible cardioversion. Patient denies any increased cough.  Says his O2 saturations have been okay on oxygen between 2 to 3 L.     OV 03/16/2020  Subjective:  Patient ID: Vivi Ferns, male , DOB: 08-Jun-1941, age 67 y.o. years. , MRN: [id number],  ADDRESS: [place] [address] PCP   [name], PA-C Providers : Treatment Team:  Attending Provider: Brand Males, MD   Chief Complaint  Patient presents with  . Follow-up    pt states esbriet causes itching     - Follow-up idiopathic pulmonary fibrosis started on Pirfenidone (Esbriet)  - sinceeMay/June 2018.  - [place] RLL lung cancer - s/p XRT Feb 2019 - CT June 2019 - withj 79m LLL new nodule  -Chronic hypoxemic respiratory failure due to the above.  R HPI JKennen StammerHunsucker 80y.o. - presents for follow-up.  I meeting his wife for the first time.  I personally not seen him face-to-face in a long time.  He has lost a lot of weight.  His shortness of breath is also worse.  Wife reports dysphagia for solids and the food getting stuck in the mouth in the throat for the last few to several months.  He is having to drink water to clear the solid being stuck.  His last CT scan of the chest was in March 2020.  At home he uses 3 L of oxygen.  Wife is also reporting significant itching that she strongly believes is because of pirfenidone.  She says ever since he started pirfenidone he has had diffuse itching.  Did use hydrocortisone cream on a as needed basis to control this.  He has completed his radiation treatment.  He has had his Covid vaccine and is trying to wait to get a booster.  He is not had his flu shot for the season as yet.   IMPRESSION: HRCT 1. Evolving radiation fibrosis in the posterior right lower lobe, without evidence of local tumor recurrence. 2. No findings highly suspicious for metastatic disease in the chest. Mild right hilar adenopathy is stable and potentially reactive. Several scattered solid left pulmonary nodules are stable since 2018 and probably benign. No new significant pulmonary nodules. 3. 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 [place] Iss 5, [phone] Mar 08 2017. 4. Mild cardiomegaly. 5. Stable calcified bilateral pleural plaques and smooth pleural thickening without significant pleural effusions, compatible with asbestos related pleural disease.  Aortic Atherosclerosis (ICD10-I70.0) and Emphysema (ICD10-J43.9).   Electronically Signed   By: JIlona SorrelM.D.   On: 09/17/2018 16:21 ROS - per HPI    OV 04/13/2020   Subjective:  Patient ID: JVivi Ferns male , DOB: 712-15-1942 age 89y.o. years. , MRN: [id number]  ADDRESS: [id number] [id number]  BTheodosia Blender PA-C Providers : Treatment Team:  Attending Provider: RBrand Males MD Other provider: Dr TCorky Downscards   Chief Complaint  Patient presents with  . Follow-up    Pt states his breathing has become worse since last visit. states he is also coughing getting up white to yellow phlegm. Denies any complaints of wheezing. Pt states he did start taking Esbriet again after being off of it x3 weeks. States he started back on med 10/4   - Follow-up idiopathic pulmonary fibrosis started on Pirfenidone (Esbriet)  - sinceeMay/June 2018.  - [place] RLL lung cancer - s/p XRT Feb 2019 - CT June 2019 - withj 578mLLL new nodule  -  Chronic hypoxemic respiratory failure due to the abo  - chronci systlic CHF - ef 12% OCt 2020, 25% in June/Oct 2021   HPI Claudia A Gade 80 y.o. -presents for follow-up.  At last visit we were concerned about progression pulmonary fibrosis and also needed to know his lung cancer staging.  At first he said he did not have a follow-up CT scan of the chest as I suggested.  Later he and his significant other recollected they had it in [place].  I could not get hold of this results.  I do not see anywhere these results have been faxed to me.  I have noted in thoracic radiology Dr. Lorin Picket to review  that and get back to me.  In the interim he has seen cardiology.  He had a another echocardiogram that shows potential severe aortic stenosis but also severe chronic systolic heart failure ejection fraction 20%.  His oxygen needs at 3 L at rest.  But here he is on 5 L pulsed and when he stood up it went down to 88%.  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       The combination of all this he is lost weight.  Is continue to lose weight.  In terms of his itching which the significant other thought might be due to pirfenidone.  He stopped his pirfenidone for 3 weeks but his itching did not go away at all.  He is not sure that even improved.  Therefore he is back on his pirfenidone.  The itching is not any worse.  He will have his flu vaccine today.  He is in need of a Covid booster.      SYMPTOM SCALE - ILD 04/13/2020 Last Weight  Most recent update: 04/13/2020  2:25 PM   Weight  72.3 kg (159 lb 6.4 oz)             O2 use *3L at res  Shortness of Breath 0 -> 5 scale with 5 being worst (score 6 If unable to do)  At rest Did not answer  Simple tasks - showers, clothes change, eating, shaving 4  Household (dishes, doing bed, laundry) x  Shopping x  Walking level at own pace 4  Walking up Stairs 4  Total (30-36) Dyspnea Score x  How bad is your cough? 2  How bad is your fatigue 3  How bad is nausea x  How bad is vomiting?  x  How bad is diarrhea? xx  How bad is anxiety? x  How bad is depression x         Simple office walk 185 feet x  3 laps goal with forehead probe 11/28/2017  12/30/2017  03/31/2018  04/13/2020   O2 used Room air Room air Room air   Number laps completed 1 2 - stopeed at end due to desat 2 - stopped at end of 2 lapst due to desat   Comments about pace Normal pace slow Normal pace   Resting Pulse Ox/HR 93% and 63/min 96%/61/min     97% and 76/min   Final Pulse  Ox/HR 84% and 91/min 87% and 97/min 86% and 91/min   Desaturated </= 88% yes yes ues   Desaturated <= 3% points yes yes ues   Got Tachycardic >/= 90/min yes yes yes   Symptoms at end of test x x Mild dyspnea  Miscellaneous comments desatruiated in 1 lap, corrected with 2L [place] at 1 lap desat after 2 laps - same ass before Similar to before      PFT Results Latest Ref Rng & Units 12/10/2018 03/31/2018 08/01/2017 12/09/2016 05/03/2016 04/14/2015  FVC-Pre L 2.99 3.35 3.53 3.84 3.70 3.96  FVC-Predicted Pre % 70 79 82 89 85 90  FVC-Post L - - - - 3.92 4.03  FVC-Predicted Post % - - - - 90 92  Pre FEV1/FVC % % 94 85 80 76 75 72  Post FEV1/FCV % % - - - - 73 72  FEV1-Pre L 2.83 2.85 2.83 2.92 2.78 2.86  FEV1-Predicted Pre % 93 94 92 94 89 90  FEV1-Post L - - - - 2.85 2.91  DLCO uncorrected ml/min/mmHg 8.42 10.69 10.88 11.70 12.45 13.79  DLCO UNC% % 33 31 32 34 36 40  DLCO corrected ml/min/mmHg - - 11.59 11.02 12.13 -  DLCO COR %Predicted % - - 34 32 36 -  DLVA Predicted % 38 41 41 41 50 56  TLC L - - - - 5.96 5.72  TLC % Predicted % - - - - 82 78  RV % Predicted % - - - - 77 57   IMPRESSIONS   ECHO OCt 2021 1. Left ventricular ejection fraction, by estimation, is 20 to 25%. The  left ventricle has severely decreased function. The left ventricle  demonstrates global hypokinesis. The left ventricular internal cavity size  was mildly dilated. Left ventricular  diastolic parameters are consistent with Grade I diastolic dysfunction  (impaired relaxation). Elevated left ventricular end-diastolic pressure.  2. Right ventricular systolic function is severely reduced. The right  ventricular size is normal. There is moderately elevated pulmonary artery  systolic pressure.  3. Left atrial size was moderately dilated.  4. Right atrial size was moderately dilated.  5. The mitral valve is normal in structure. Trivial mitral valve  regurgitation. No evidence of mitral stenosis.  6. Based on peak  gradient and mean velocity, the aortic stenosis is mild.  However visually, it appears to be severe. Likely low flow-low gradient  aortic stenosis.. The aortic valve is normal in structure. There is severe  calcifcation of the aortic  valve. There is severe thickening of the aortic valve. Aortic valve  regurgitation is not visualized. No aortic stenosis is present. Aortic  valve area, by VTI measures 1.11 cm. Aortic valve mean gradient measures  19.0 mmHg. Aortic valve Vmax measures  2.71 m/s.  7. The inferior vena cava is dilated in size with <50% respiratory  variability, suggesting right atrial pressure of 15 mmHg.     OV 08/31/2020  Subjective:  Patient ID: Vivi Ferns, male , DOB: 07/12/1940 , age 17 y.o. , MRN: [id number] , ADDRESS: [place] [address] PCP [name], PA-C Patient Care Team: [name], PA-C as PCP - General (Family Medicine) Troy Sine, MD as PCP - Cardiology (Cardiology)  This Provider for this visit: Treatment Team:  Attending Provider: Brand Males, MD    08/31/2020 -   Chief Complaint  Patient presents with  . Follow-up    SOB is getting worse, occ dry cough     - Follow-up idiopathic pulmonary fibrosis started on Pirfenidone (Esbriet)  - sinceeMay/June 2018.  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 -returns for follow-up.  He says overall he is living in a ECOG 3-4 existence.  He is able to go from room to room.  Takes a long time to shower.  He needs assist with shower.  He uses between 3-5 L of oxygen based on subjective needs.  He is mostly fatigue.  But compared to last visit he is stable.  He did see cardiology and they felt that he is to  high risk candidate for interventions.  I reviewed Dr. Antionette Char note.  He has chronic systolic heart failure and pulmonary hypertension.  We discussed the possibility of inhaled treprostinil for pulmonary hypertension now that is approved for WHO group 3 but his chronic systolic heart failure.  We looked at possibility of inhaled nitric oxide on a research protocol but again chronic systolic heart failure is and exclusion.Marland Kitchen  He continues to have itching but he does not think is related to pirfenidone.  He wants to continue his life assess.  He understands the very limited treatment options.       SYMPTOM SCALE - ILD 04/13/2020 Last Weight  Most recent update: 04/13/2020  2:25 PM    Weight  72.3 kg (159 lb 6.4 oz)            08/31/2020 157#  O2 use *3L at res 3L rest -> 5L wth ex  Shortness of Breath 0 -> 5 scale with 5 being worst (score 6 If unable to do)   At rest Did not answer 2  Simple tasks - showers, clothes change, eating, shaving 4 3  Household (dishes, doing bed, laundry) x 5  Shopping x 5  Walking level at own pace 4 4  Walking up Stairs 4 5  Total (30-36) Dyspnea Score x 24  How bad is your cough? 2 0  How bad is your fatigue 3 0  How bad is nausea x 0  How bad is vomiting?  x 00  How bad is diarrhea? xx 0  How bad is anxiety? x 0  How bad is depression x 0         Simple office walk 185 feet x  3 laps goal with forehead probe 11/28/2017  12/30/2017  03/31/2018  04/13/2020 and 08/31/2020    O2 used Room air Room air Room air Not walked due to wheel chair. On 08/31/2020 - RA 87%   Number laps completed 1 2 - stopeed at end due to desat 2 - stopped at end of 2 lapst due to desat   Comments about pace Normal pace slow Normal pace   Resting Pulse Ox/HR 93% and 63/min 96%/61/min     97% and 76/min   Final Pulse Ox/HR 84% and 91/min 87% and 97/min 86% and 91/min   Desaturated </= 88% yes yes ues   Desaturated <= 3% points yes yes ues   Got Tachycardic >/= 90/min  yes yes yes   Symptoms at end of test x x Mild dyspnea   Miscellaneous comments desatruiated in 1 lap, corrected with 2L [place] at 1 lap desat after 2 laps - same ass before Similar to before      PFT  PFT Results Latest Ref Rng & Units 12/10/2018 03/31/2018 08/01/2017 12/09/2016 05/03/2016 04/14/2015  FVC-Pre L 2.99 3.35 3.53 3.84 3.70 3.96  FVC-Predicted Pre % 70 79 82  89 85 90  FVC-Post L - - - - 3.92 4.03  FVC-Predicted Post % - - - - 90 92  Pre FEV1/FVC % % 94 85 80 76 75 72  Post FEV1/FCV % % - - - - 73 72  FEV1-Pre L 2.83 2.85 2.83 2.92 2.78 2.86  FEV1-Predicted Pre % 93 94 92 94 89 90  FEV1-Post L - - - - 2.85 2.91  DLCO uncorrected ml/min/mmHg 8.42 10.69 10.88 11.70 12.45 13.79  DLCO UNC% % 33 31 32 34 36 40  DLCO corrected ml/min/mmHg - - 11.59 11.02 12.13 -  DLCO COR %Predicted % - - 34 32 36 -  DLVA Predicted % 38 41 41 41 50 56  TLC L - - - - 5.96 5.72  TLC % Predicted % - - - - 82 78  RV % Predicted % - - - - 77 57       has a past medical history of AAA (abdominal aortic aneurysm) (HCC), Aortic stenosis, mild, Atrial fibrillation (HCC), Atrial flutter ([place]), CAD (coronary artery disease), DVT (deep venous thrombosis) (HCC), History of stress test, Hyperlipemia, Hypertension, Iliac artery aneurysm (HCC), PVD (peripheral vascular disease) ([place]), and 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   reports that he quit smoking about 34 years ago. His smoking use included cigarettes. He has a 50.00 pack-year smoking history. He has never used smokeless tobacco.  Past Surgical History:  Procedure Laterality Date  . ABDOMINAL AORTOGRAM W/LOWER EXTREMITY N/A 02/18/2017   Procedure: ABDOMINAL AORTOGRAM W/LOWER EXTREMITY;  Surgeon: Serafina Mitchell, MD;  Location: [facility];  Service: Cardiovascular;  Laterality: N/A;  . ABDOMINAL AORTOGRAM W/LOWER EXTREMITY N/A 05/05/2018   Procedure: ABDOMINAL AORTOGRAM W/LOWER EXTREMITY;  Surgeon: Serafina Mitchell, MD;  Location:  [facility];  Service: Cardiovascular;  Laterality: N/A;  . BACK SURGERY  2003  . CARDIAC ELECTROPHYSIOLOGY STUDY AND ABLATION  03/19/10  . CARDIOVERSION  08/16/08  . CHOLECYSTECTOMY  08/13/02  . CORONARY ARTERY BYPASS GRAFT  1994 & 2005  . FEMORAL-TIBIAL BYPASS GRAFT     Remote  . HERNIA REPAIR  06/03/07  . PERIPHERAL VASCULAR INTERVENTION Left 02/18/2017   Procedure: PERIPHERAL VASCULAR INTERVENTION;  Surgeon: Serafina Mitchell, MD;  Location: [facility];  Service: Cardiovascular;  Laterality: Left;  POPLITEAL  . PERIPHERAL VASCULAR INTERVENTION Bilateral 05/05/2018   Procedure: PERIPHERAL VASCULAR INTERVENTION;  Surgeon: Serafina Mitchell, MD;  Location: [facility];  Service: Cardiovascular;  Laterality: Bilateral;  Rt fem/pop graft and Lt popliteal  . RIGHT/LEFT HEART CATH AND CORONARY/GRAFT ANGIOGRAPHY N/A 11/29/2019   Procedure: RIGHT/LEFT HEART CATH AND CORONARY/GRAFT ANGIOGRAPHY;  Surgeon: Troy Sine, MD;  Location: [facility];  Service: Cardiovascular;  Laterality: N/A;  . VASECTOMY  01/28/08    Allergies  Allergen Reactions  . Fentanyl   . Fentanyl And Related Other (See Comments)    Hallucinations  . No Known Allergies     Immunization History  Administered Date(s) Administered  . Influenza Split 04/20/2014, 04/08/2015  . Influenza, High Dose Seasonal PF 05/17/2016, 05/12/2017, 04/08/2019  . Influenza,inj,Quad PF,6+ Mos 03/10/2015  . Influenza-Unspecified 06/02/2012  . Moderna Sars-Covid-2 Vaccination 07/19/2019, 08/16/2019  . Pneumococcal Conjugate-13 02/23/2016  . Pneumococcal Polysaccharide-23 03/28/2017  . Tdap 07/08/2010    Family History  Problem Relation Age of Onset  . Lung cancer Mother        smoked  . Heart failure Father   . Stroke  Sister      Current Outpatient Medications:  .  acetaminophen (TYLENOL) 500 MG tablet, Take 1,000 mg by mouth every 6 (six) hours as needed for moderate pain or headache. , Disp: , Rfl:  .   aspirin 81 MG tablet, Take 81 mg by mouth daily., Disp: , Rfl:  .  calcium carbonate (OS-CAL) 600 MG TABS, Take 600 mg by mouth 2 (two) times daily with a meal., Disp: , Rfl:  .  Cholecalciferol (VITAMIN D) 2000 units CAPS, Take 2,000 Units by mouth daily., Disp: , Rfl:  .  digoxin (LANOXIN) 0.125 MG tablet, Take one tablet by mouth daily EXCEPT Do NOT take on Sunday., Disp: 90 tablet, Rfl: 3 .  famotidine (PEPCID) 20 MG tablet, TAKE 1 TABLET BY MOUTH AT BEDTIME AS DIRECTED, Disp: 90 tablet, Rfl: 3 .  folic acid (FOLVITE) 1 MG tablet, Take 1 mg by mouth every evening. , Disp: , Rfl:  .  hydrochlorothiazide (MICROZIDE) 12.5 MG capsule, Take 12.5 mg by mouth every other day. , Disp: , Rfl:  .  niacin (NIASPAN) 500 MG CR tablet, Take 2,000 mg by mouth every evening. , Disp: , Rfl:  .  oxyCODONE-acetaminophen (PERCOCET) 10-325 MG tablet, Take 1 tablet by mouth 3 (three) times daily as needed for pain. , Disp: , Rfl:  .  pantoprazole (PROTONIX) 40 MG tablet, TAKE 1 TABLET BY MOUTH ONCE DAILY 30 TO 60 MINUTES BEFORE FIRST MEAL OF THE DAY, Disp: 90 tablet, Rfl: 3 .  Pirfenidone (ESBRIET) 267 MG TABS, Take 3 tablets (801 mg total) by mouth 3 (three) times daily., Disp: 810 tablet, Rfl: 5 .  simvastatin (ZOCOR) 20 MG tablet, Take 1 tablet (20 mg total) by mouth daily at 6 PM., Disp: 90 tablet, Rfl: 2 .  warfarin (COUMADIN) 5 MG tablet, Take 1 tablet by mouth daily or as directed by PCP, Disp: 45 tablet, Rfl: 0      Objective:   Vitals:   08/31/20 1521  BP: 112/72  Pulse: 89  Temp: 98 F (36.7 C)  TempSrc: Oral  SpO2: (!) 86%  Weight: 157 lb 6.4 oz (71.4 kg)  Height: 5' 10.5" (1.791 m)    Estimated body mass index is 22.27 kg/m as calculated from the following:   Height as of this encounter: 5' 10.5" (1.791 m).   Weight as of this encounter: 157 lb 6.4 oz (71.4 kg).  _0 @  Filed Weights   08/31/20 1521  Weight: 157 lb 6.4 oz (71.4 kg)     Physical Exam General: No distress.   Fatigue deconditioned male sitting in the wheelchair.  I made him walk with his oxygen on he only walked 10 to 15 feet and then stopped.  He had scoliosis Neuro: Alert and Oriented x 3. GCS 15. Speech normal Psych: Pleasant Resp:  Barrel Chest - n.  Wheeze - no, Crackles - yes, No overt respiratory distress CVS: Normal heart sounds. Murmurs - no Ext: Stigmata of Connective Tissue Disease - no HEENT: Normal upper airway. PEERL +. No post nasal drip     Assessment:       ICD-10-CM   1. Chronic respiratory failure with hypoxia (HCC)  J96.11   2. IPF (idiopathic pulmonary fibrosis) ([place])  J84.112   3. Therapeutic drug monitoring  Z51.81   4. Malignant neoplasm of lower lobe of right lung (HCC)  C34.31   5. 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 Chronic respiratory failure with hypoxia (HCC)  J96.11   2. IPF (idiopathic pulmonary fibrosis) ([place])  J84.112   3. Therapeutic drug monitoring  Z51.81   4. Malignant neoplasm of lower lobe of right lung (HCC)  C34.31   5. WHO group 3 pulmonary arterial hypertension (HCC)  I27.23      -Clinically IPF likely  worse over time  -Glad you are tolerating pirfenidone and I think it is helping to the extent possible -  You  also pulmonary hypertension related to pulmonary fibrosis based on May 2021 heart catheterization -Overall shortness of breath and fatigue is related to your heart and lung conditions  Plan -Check liver function test today   -Continue oxygen -3 L at rest but just to get the pulse ox greater than 88% -Continue pirfenidone as per schedule -Continue to be active to the extent possible -I looked into inhaled treprostinil for your pulmonary hypertension but  because of a weak heart you do not qualify for this -I looked into inhaled nitric oxide research protocol but because of your weak heart you do not qualify for that [the aim of the study was to help  with improved mobility and shortness of breath] -Too weak to do pulmonary function testing -Do high-resolution CT chest without contrast in 6 months   Lung Cancer Hx   - do HRCT without contrast in 6 months  . Follow-up - 6 months or sooner with Dr. Chase Caller in a 30-minute visit; return sooner if needed  ( Level 05 visit: Estb 40-54 min  in  visit type: on-site physical face to visit  in total care time and counseling or/and coordination of care by this undersigned MD - Dr Brand Males. This includes one or more of the following on this same day 08/31/2020: pre-charting, chart review, note writing, documentation discussion of test results, diagnostic or treatment recommendations, prognosis, risks and benefits of management options, instructions, education, compliance or risk-factor reduction. It excludes time spent by the [facility] or office staff in the care of the patient. Actual time 40 min)    SIGNATURE    Dr. Brand Males, M.D., F.C.C.P,  Pulmonary and Critical Care Medicine Staff Physician, [facility] Director - Interstitial Lung Disease  Program  Pulmonary [place] at [place], [place], [address]  Pager: [phone], If no answer or between  15:00h - 7:00h: call [phone] Telephone: [phone]  4:14 PM 08/31/2020

## 2020-10-23 ENCOUNTER — Ambulatory Visit: Payer: Medicare Other | Admitting: Cardiovascular Disease

## 2020-10-27 ENCOUNTER — Telehealth: Payer: Self-pay | Admitting: Internal Medicine

## 2020-10-27 NOTE — Telephone Encounter (Signed)
LFT 10/18/20 - is normal.  Creat 1.47m%  Labs done by PCP  Plan  - ok to continue esbriet

## 2020-10-27 NOTE — Telephone Encounter (Signed)
Called and spoke with pt letting him know the results of labwork per MR and stated to him that he can continue the [place]. Pt verbalized understanding. Nothing further needed.

## 2020-12-16 ENCOUNTER — Other Ambulatory Visit: Payer: Self-pay | Admitting: Internal Medicine

## 2021-01-05 ENCOUNTER — Telehealth: Payer: Self-pay

## 2021-01-05 ENCOUNTER — Other Ambulatory Visit (HOSPITAL_COMMUNITY): Payer: Self-pay

## 2021-01-05 NOTE — Telephone Encounter (Signed)
Received fax from [facility] stating they have been unable to contact pt regarding current financial status. Filled paperwork out stating no change and attached current insurance cards.  Phone# [phone] Fax# [phone]   Per [facility], pt's current PA is good until 07/07/2021.

## 2021-01-22 ENCOUNTER — Telehealth: Payer: Self-pay | Admitting: Internal Medicine

## 2021-01-22 NOTE — Telephone Encounter (Signed)
Order was placed on 08/31/20 for pt to get CT Chest High Res in 6 months.  I have him scheduled at [facility] location on 8/26 at 1:40.  I called pt to give him info & he put wife on the phone.  She states pt was sent home from hospital late week with hospice.  He was in hospital in [place] and [place].  They were supposed to send records to [facility].  Does pt still need to have chest CT in August?

## 2021-01-22 NOTE — Telephone Encounter (Signed)
MR- please see [facility], [facility], [facility] note below and advise if CT still needed, thansk

## 2021-01-30 NOTE — Telephone Encounter (Signed)
I have made Judeen Hammans, [facility] aware that pt's HRCT can be cancelled. Will close encounter.

## 2021-01-30 NOTE — Telephone Encounter (Signed)
Does not need a CT because he is in hospice. He can see me first avail if he is strong enough.  Otherwise, PRN followup

## 2021-02-09 ENCOUNTER — Ambulatory Visit: Payer: Medicare Other | Admitting: Cardiovascular Disease

## 2021-03-02 ENCOUNTER — Other Ambulatory Visit: Payer: Medicare Other

## 2021-03-08 DEATH — deceased

## 2021-08-15 ENCOUNTER — Telehealth: Payer: Self-pay | Admitting: Internal Medicine

## 2021-08-15 NOTE — Telephone Encounter (Signed)
EMily  Pleae find out status of Troy Miller . He went to hospice and then no news  Thanks    SIGNATURE    Dr. Brand Males, M.D., F.C.C.P,  Pulmonary and Critical Care Medicine Staff Physician, [facility] Director - Interstitial Lung Disease  Program  Pulmonary [place] at [place], [place], [address]  NPI Number:  NPI #[id number] [facility] Number: [id number]  Pager: [phone], If no answer  -> Check AMION or Try [phone] Telephone (clinical office): [phone] Telephone (research): [phone]  2:21 PM 08/15/2021

## 2021-08-17 NOTE — Telephone Encounter (Signed)
Attempted to call pt but unable to reach and unable to leave VM as mailbox was full.  Attempted to call pt's son Nathanie but received a message that said that the call could not be completed as dialed.  Will try to call back later.

## 2021-08-24 NOTE — Telephone Encounter (Signed)
Attempted to call pt but unable to reach and unable to leave VM as mailbox is full.   Attempted to call pt's son but unable to reach. Left message for him to return call.

## 2021-08-28 NOTE — Telephone Encounter (Signed)
With multiple attempts trying to contact pt and unable to reach, per protocol encounter will be closed.

## 2022-02-08 ENCOUNTER — Telehealth: Payer: Self-pay | Admitting: Internal Medicine

## 2022-02-08 NOTE — Telephone Encounter (Signed)
Received a request from [facility] for medical records for period 10/28/2019 to 03-12-21.  Request included copy of patient's death certificate.  I faxed the form back to [facility] with note to contact [facility].  Updated patient's chart with date of death and put documents in to be scanned.
